# Patient Record
Sex: Female | Born: 1940 | Race: White | Hispanic: No | Marital: Married | Smoking: Never smoker
Health system: Southern US, Community
[De-identification: ages and names within clinical notes are randomized; demographics above are authoritative.]

## PROBLEM LIST (undated history)

## (undated) DIAGNOSIS — R55 Syncope and collapse: Secondary | ICD-10-CM

## (undated) DIAGNOSIS — I10 Essential (primary) hypertension: Secondary | ICD-10-CM

## (undated) DIAGNOSIS — M810 Age-related osteoporosis without current pathological fracture: Secondary | ICD-10-CM

## (undated) DIAGNOSIS — O039 Complete or unspecified spontaneous abortion without complication: Secondary | ICD-10-CM

## (undated) DIAGNOSIS — D649 Anemia, unspecified: Secondary | ICD-10-CM

## (undated) DIAGNOSIS — M5136 Other intervertebral disc degeneration, lumbar region: Secondary | ICD-10-CM

## (undated) DIAGNOSIS — M51369 Other intervertebral disc degeneration, lumbar region without mention of lumbar back pain or lower extremity pain: Secondary | ICD-10-CM

## (undated) DIAGNOSIS — G629 Polyneuropathy, unspecified: Secondary | ICD-10-CM

## (undated) DIAGNOSIS — J189 Pneumonia, unspecified organism: Secondary | ICD-10-CM

## (undated) DIAGNOSIS — G839 Paralytic syndrome, unspecified: Secondary | ICD-10-CM

## (undated) DIAGNOSIS — K635 Polyp of colon: Secondary | ICD-10-CM

## (undated) DIAGNOSIS — Z8719 Personal history of other diseases of the digestive system: Secondary | ICD-10-CM

## (undated) DIAGNOSIS — M1990 Unspecified osteoarthritis, unspecified site: Secondary | ICD-10-CM

## (undated) DIAGNOSIS — Z5189 Encounter for other specified aftercare: Secondary | ICD-10-CM

## (undated) DIAGNOSIS — Z973 Presence of spectacles and contact lenses: Secondary | ICD-10-CM

## (undated) DIAGNOSIS — Z8489 Family history of other specified conditions: Secondary | ICD-10-CM

## (undated) DIAGNOSIS — F419 Anxiety disorder, unspecified: Secondary | ICD-10-CM

## (undated) DIAGNOSIS — G43909 Migraine, unspecified, not intractable, without status migrainosus: Secondary | ICD-10-CM

## (undated) DIAGNOSIS — R011 Cardiac murmur, unspecified: Secondary | ICD-10-CM

## (undated) DIAGNOSIS — K219 Gastro-esophageal reflux disease without esophagitis: Secondary | ICD-10-CM

## (undated) DIAGNOSIS — M5135 Other intervertebral disc degeneration, thoracolumbar region: Secondary | ICD-10-CM

## (undated) DIAGNOSIS — E039 Hypothyroidism, unspecified: Secondary | ICD-10-CM

## (undated) DIAGNOSIS — K259 Gastric ulcer, unspecified as acute or chronic, without hemorrhage or perforation: Secondary | ICD-10-CM

## (undated) HISTORY — PX: OTHER SURGICAL HISTORY: SHX169

## (undated) HISTORY — PX: ANTERIOR CERVICAL DECOMP/DISCECTOMY FUSION: SHX1161

## (undated) HISTORY — DX: Other intervertebral disc degeneration, lumbar region: M51.36

## (undated) HISTORY — PX: DILATION AND CURETTAGE OF UTERUS: SHX78

## (undated) HISTORY — DX: Migraine, unspecified, not intractable, without status migrainosus: G43.909

## (undated) HISTORY — PX: BILATERAL OOPHORECTOMY: SHX1221

## (undated) HISTORY — DX: Other intervertebral disc degeneration, lumbar region without mention of lumbar back pain or lower extremity pain: M51.369

## (undated) HISTORY — DX: Age-related osteoporosis without current pathological fracture: M81.0

## (undated) HISTORY — PX: ABDOMINAL HYSTERECTOMY: SHX81

## (undated) HISTORY — DX: Other intervertebral disc degeneration, thoracolumbar region: M51.35

---

## 2002-07-29 ENCOUNTER — Ambulatory Visit (HOSPITAL_COMMUNITY): Admission: RE | Admit: 2002-07-29 | Discharge: 2002-07-29 | Payer: Self-pay | Admitting: Internal Medicine

## 2006-01-13 ENCOUNTER — Inpatient Hospital Stay (HOSPITAL_COMMUNITY): Admission: RE | Admit: 2006-01-13 | Discharge: 2006-01-15 | Payer: Self-pay | Admitting: Neurosurgery

## 2006-02-01 ENCOUNTER — Inpatient Hospital Stay (HOSPITAL_COMMUNITY): Admission: EM | Admit: 2006-02-01 | Discharge: 2006-02-06 | Payer: Self-pay | Admitting: Emergency Medicine

## 2006-04-11 HISTORY — PX: THORACIC SPINE SURGERY: SHX802

## 2010-04-11 DIAGNOSIS — R55 Syncope and collapse: Secondary | ICD-10-CM

## 2010-04-11 DIAGNOSIS — Z5189 Encounter for other specified aftercare: Secondary | ICD-10-CM

## 2010-04-11 DIAGNOSIS — IMO0001 Reserved for inherently not codable concepts without codable children: Secondary | ICD-10-CM

## 2010-04-11 HISTORY — DX: Reserved for inherently not codable concepts without codable children: IMO0001

## 2010-04-11 HISTORY — DX: Encounter for other specified aftercare: Z51.89

## 2010-04-11 HISTORY — DX: Syncope and collapse: R55

## 2010-10-29 ENCOUNTER — Ambulatory Visit: Payer: PRIVATE HEALTH INSURANCE | Admitting: Cardiology

## 2010-11-03 ENCOUNTER — Telehealth (INDEPENDENT_AMBULATORY_CARE_PROVIDER_SITE_OTHER): Payer: Self-pay | Admitting: *Deleted

## 2010-11-03 ENCOUNTER — Other Ambulatory Visit (INDEPENDENT_AMBULATORY_CARE_PROVIDER_SITE_OTHER): Payer: Self-pay | Admitting: *Deleted

## 2010-11-03 DIAGNOSIS — Z800 Family history of malignant neoplasm of digestive organs: Secondary | ICD-10-CM

## 2010-11-03 DIAGNOSIS — D649 Anemia, unspecified: Secondary | ICD-10-CM

## 2010-11-03 NOTE — Telephone Encounter (Addendum)
TCS/EGD sch'd 11/08/10 @ 9:45 (8:45), ASA 2 days  Pt aware, family member to pick up instruction sheet & prep kit

## 2010-11-04 ENCOUNTER — Encounter (INDEPENDENT_AMBULATORY_CARE_PROVIDER_SITE_OTHER): Payer: Self-pay | Admitting: *Deleted

## 2010-11-04 ENCOUNTER — Other Ambulatory Visit (INDEPENDENT_AMBULATORY_CARE_PROVIDER_SITE_OTHER): Payer: Self-pay | Admitting: *Deleted

## 2010-11-04 DIAGNOSIS — D649 Anemia, unspecified: Secondary | ICD-10-CM

## 2010-11-04 MED ORDER — SODIUM CHLORIDE 0.45 % IV SOLN
Freq: Once | INTRAVENOUS | Status: AC
  Administered 2010-11-05: 07:00:00 via INTRAVENOUS

## 2010-11-05 ENCOUNTER — Ambulatory Visit (HOSPITAL_COMMUNITY)
Admission: RE | Admit: 2010-11-05 | Discharge: 2010-11-05 | Disposition: A | Discharge status: Home / Self Care | Payer: MEDICARE | Source: Ambulatory Visit | Attending: Internal Medicine | Admitting: Internal Medicine

## 2010-11-05 ENCOUNTER — Encounter (HOSPITAL_COMMUNITY): Payer: Self-pay | Admitting: *Deleted

## 2010-11-05 ENCOUNTER — Encounter (HOSPITAL_COMMUNITY)
Admission: RE | Disposition: A | Discharge status: Home / Self Care | Payer: Self-pay | Source: Ambulatory Visit | Attending: Internal Medicine

## 2010-11-05 DIAGNOSIS — K254 Chronic or unspecified gastric ulcer with hemorrhage: Secondary | ICD-10-CM | POA: Insufficient documentation

## 2010-11-05 DIAGNOSIS — K921 Melena: Secondary | ICD-10-CM | POA: Insufficient documentation

## 2010-11-05 DIAGNOSIS — I10 Essential (primary) hypertension: Secondary | ICD-10-CM | POA: Insufficient documentation

## 2010-11-05 DIAGNOSIS — K250 Acute gastric ulcer with hemorrhage: Secondary | ICD-10-CM

## 2010-11-05 DIAGNOSIS — D509 Iron deficiency anemia, unspecified: Secondary | ICD-10-CM | POA: Insufficient documentation

## 2010-11-05 DIAGNOSIS — Z79899 Other long term (current) drug therapy: Secondary | ICD-10-CM | POA: Insufficient documentation

## 2010-11-05 DIAGNOSIS — K449 Diaphragmatic hernia without obstruction or gangrene: Secondary | ICD-10-CM

## 2010-11-05 DIAGNOSIS — Z01812 Encounter for preprocedural laboratory examination: Secondary | ICD-10-CM | POA: Insufficient documentation

## 2010-11-05 HISTORY — DX: Essential (primary) hypertension: I10

## 2010-11-05 HISTORY — PX: ESOPHAGOGASTRODUODENOSCOPY: SHX5428

## 2010-11-05 HISTORY — DX: Anemia, unspecified: D64.9

## 2010-11-05 HISTORY — DX: Encounter for other specified aftercare: Z51.89

## 2010-11-05 HISTORY — DX: Anxiety disorder, unspecified: F41.9

## 2010-11-05 HISTORY — DX: Unspecified osteoarthritis, unspecified site: M19.90

## 2010-11-05 SURGERY — EGD (ESOPHAGOGASTRODUODENOSCOPY)
Anesthesia: Moderate Sedation

## 2010-11-05 MED ORDER — BUTAMBEN-TETRACAINE-BENZOCAINE 2-2-14 % EX AERO
INHALATION_SPRAY | CUTANEOUS | Status: DC | PRN
  Administered 2010-11-05: 2 via TOPICAL

## 2010-11-05 MED ORDER — MEPERIDINE HCL 25 MG/ML IJ SOLN
INTRAMUSCULAR | Status: DC | PRN
  Administered 2010-11-05 (×2): 25 mg via INTRAVENOUS

## 2010-11-05 MED ORDER — PANTOPRAZOLE SODIUM 40 MG PO TBEC: 40.0000 mg | DELAYED_RELEASE_TABLET | Freq: Two times a day (BID) | ORAL | Status: DC

## 2010-11-05 MED ORDER — MEPERIDINE HCL 100 MG/ML IJ SOLN
INTRAMUSCULAR | Status: AC
  Filled 2010-11-05: qty 1

## 2010-11-05 MED ORDER — MIDAZOLAM HCL 5 MG/5ML IJ SOLN
INTRAMUSCULAR | Status: AC
  Filled 2010-11-05: qty 10

## 2010-11-05 MED ORDER — MIDAZOLAM HCL 5 MG/5ML IJ SOLN
INTRAMUSCULAR | Status: DC | PRN
  Administered 2010-11-05: 1 mg via INTRAVENOUS
  Administered 2010-11-05 (×2): 2 mg via INTRAVENOUS

## 2010-11-05 NOTE — H&P (Signed)
Debra Sanchez is an 70 y.o. female.   Chief Complaint: Patient is here for EGD she has history of melena and anemia. HPI: Patient is a 70 year old Caucasian female who's been experiencing intermittent melena over the last 8 weeks. She's had 5 episodes where she would develop postprandial abdominal distention and epigastric pain which resolved after for 45 minutes. She felt like she had food poisoning and will take Prilosec for these episodes are not associated with vomiting she has lost 10 pounds in the last 8 weeks but she's trying to lose weight. Sporadically for musculoskeletal pain or headache she was recently evaluated by Dr. Sherryll Burger in order to have a hemoglobin of less than 7 g. She was admitted to receive total of 4 units. She had a transfusion reaction with the first unit. Post transfusion hemoglobin prior to discharge was 10.8 she was discharged from hospital last evening. She has infrequent heartburn. She denies dysphagia. There is no history of peptic ulcer disease. Patient had a colonic adenoma removed possibly in 2001 her last colonoscopy was at this facility in April 2007 and was normal.  Past Medical History  Diagnosis Date  . Hypertension   . Anemia   . Shortness of breath   . Depression   . Anxiety   . Arthritis   . Blood transfusion     Past Surgical History  Procedure Date  . Abdominal hysterectomy   . Bilateral oophorectomy   . Neck surgery   . 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 Colon cancer Mother    Social History:  reports that she has never smoked. She does not have any smokeless tobacco history on file. She reports that she does not drink alcohol or use illicit drugs.  Allergies:  Allergies  Allergen Reactions  . Codeine Itching  . Morphine And Related Itching  . Sulfa Antibiotics     Medications Prior to Admission  Medication Dose Route Frequency Provider  Last Rate Last Dose  . 0.45 % sodium chloride infusion   Intravenous Once Malissa Hippo, MD 20 mL/hr at 11/05/10 [phone]    . meperidine (DEMEROL) 100 MG/ML injection           . midazolam (VERSED) 5 MG/5ML injection            Medications Prior to Admission  Medication Sig Dispense Refill  . acetaminophen (TYLENOL) 500 MG tablet Take 1,000 mg by mouth every 6 (six) hours as needed.        . hydrochlorothiazide 25 MG tablet Take 25 mg by mouth daily.        . sertraline (ZOLOFT) 100 MG tablet Take 50 mg by mouth daily.        Marland Kitchen zolpidem (AMBIEN CR) 12.5 MG CR tablet Take 5 mg by mouth at bedtime as needed.        Marland Kitchen omeprazole (PRILOSEC) 20 MG capsule Take 20 mg by mouth daily.          No results found for this or any previous visit (from the past 48 hour(s)). No results found.  Review of Systems  Constitutional: Positive for weight loss (10 lb weight loss in 8 weeks.). Negative for fever, chills, malaise/fatigue and diaphoresis.  Respiratory: Positive for shortness of breath.   Gastrointestinal: Positive for heartburn (not very often) and melena (intermittent for the last 8 weeks.). Negative for nausea, vomiting,  diarrhea and constipation. Abdominal pain: upper abdomen with bloating; has had 5 episodes in last 8 weeks.  Neurological: Positive for weakness.    Blood pressure 164/89, pulse 58, temperature 98.2 F (36.8 C), temperature source Oral, resp. rate 17, height 5\' 8"  (1.727 m), weight 200 lb (90.719 kg), SpO2 97.00%. Physical Exam  Constitutional: She appears well-developed and well-nourished.  HENT:  Mouth/Throat: Oropharynx is clear and moist.  Eyes: Conjunctivae are normal. No scleral icterus.  Neck: Neck supple. No JVD present. No thyromegaly present.  Cardiovascular: Normal rate, regular rhythm and normal heart sounds.   No murmur heard. Respiratory: Breath sounds normal.  GI: Soft. She exhibits no distension. There is no tenderness. There is no rebound and no guarding.    Musculoskeletal: She exhibits no edema.  Lymphadenopathy:    She has no cervical adenopathy.  Neurological: She is alert.  Skin: Skin is warm and dry.     Assessment/Plan Melena and anemia. Esophagogastroduodenoscopy. Patient is agreeable.  Manuelita Moxon U 11/05/2010, 7:01 AM

## 2010-11-05 NOTE — Op Note (Signed)
NAME:  Debra Sanchez, Debra Sanchez           ACCOUNT NO.:  [id number]  MEDICAL RECORD NO.:  [id number]  LOCATION:  [facility]                          FACILITY:  [facility]  PHYSICIAN:  Lionel December, M.D.    DATE OF BIRTH:  1940-06-05  DATE OF PROCEDURE:  11/05/2010 DATE OF DISCHARGE:                              OPERATIVE REPORT   PROCEDURE:  Esophagogastroduodenoscopy with therapeutic intervention.  INDICATION:  The patient is a 70 year old Caucasian female, who presents with melena and anemia.  She was just released from [facility] last night where she had 4 units of PRBCs.  Her post transfusion hemoglobin is 10.8. Procedure risks were reviewed with the patient.  Informed consent was obtained.  MEDICATIONS FOR CONSCIOUS SEDATION:  Cetacaine spray for pharyngeal topical anesthesia, Demerol 50 mg IV, Versed 5 mg IV.  FINDINGS:  Procedure performed in endoscopy suite.  The patient's vital signs and O2 sat were monitored during the procedure and remained stable.  The patient was placed in left lateral recumbent position and Pentax videoscope was passed through oropharynx without any difficulty into esophagus.  Esophagus:  Esophageal mucosa was normal.  GE junction was located at 35 cm from the incisors.  Hiatus was at 41 cm from the incisors.  There were long linear erosions at the level of hiatus extending in cephalad direction.  There was about 4 mm x 10 mm ulcer at the hiatus, extending upwards and the herniated part of stomach and proximal margin had a small fresh clot or blood vessel.  This was sealed with Gold probe.  Stomach:  It was empty and distended very well with insufflation.  Folds of proximal stomach are normal.  Examination of mucosa revealed multiple antral erosions and three ulcers, 4-5 mm in diameter.  Two of these had tiny blood vessel, but no active bleeding.  Both of these were treated with Gold probe.  Pyloric channel was patent.  Angularis was unremarkable, fundus and cardia were  also seen on retroflex view.  Duodenum:  Examination of the D1 and D2 was normal.  Endoscope was withdrawn.  The patient tolerated the procedure well.  FINAL DIAGNOSIS:  Moderate size sliding hiatal hernia with erosions and an ulcer at the level of hiatus.  Ulcer with stigmata of bleeding and was sealed with Gold probe.  Multiple antral erosions with three ulcers and two had stigmata of bleed and these were treated also with Gold probe.  RECOMMENDATIONS:  Antireflux measures.  Pantoprazole 40 mg b.i.d. prescription given for 60 with five refills.  The patient advised not to take aspirin or NSAIDs for the time being. We will delay her colonoscopy as the prep may trigger another GI bleed.  She will return for OV in 4 weeks.  At that time, we will determine the timing of her colonoscopy and we will also reexamine her upper GI tract.          ______________________________ Lionel December, M.D.     NR/MEDQ  D:  11/05/2010  T:  11/05/2010  Job:  [id number]  cc:   Dr. Horton Finer. Darovsky, M.D. Fax: [phone]

## 2010-11-05 NOTE — Brief Op Note (Signed)
11/05/2010  7:31 AM  PATIENT:  Debra Sanchez  70 y.o. female  PRE-OPERATIVE DIAGNOSIS:  anemia  POST-OPERATIVE DIAGNOSIS:  Erosions and Ulcerations @ Hiatus @ 40 cm Antral Ulcers Hiatal Hernia  PROCEDURE:  Procedure(s): ESOPHAGOGASTRODUODENOSCOPY (EGD)  SURGEON:  Surgeon(s): Malissa Hippo, MD  EGD. Moderate hiatal hernia  Ulcer with tiny blood vessel  At hiatus extending into hernia; Sealed with gold probe Multiple antral erosions with 3 small ulcers to head or any blood vessels and these were also treated with gold probe. Normal first and second part of duodenum.

## 2010-11-08 ENCOUNTER — Encounter (HOSPITAL_COMMUNITY): Admission: RE | Payer: Self-pay | Source: Ambulatory Visit

## 2010-11-08 ENCOUNTER — Ambulatory Visit (INDEPENDENT_AMBULATORY_CARE_PROVIDER_SITE_OTHER): Payer: PRIVATE HEALTH INSURANCE | Admitting: Internal Medicine

## 2010-11-08 ENCOUNTER — Ambulatory Visit (HOSPITAL_COMMUNITY): Admission: RE | Admit: 2010-11-08 | Payer: MEDICARE | Source: Ambulatory Visit | Admitting: Internal Medicine

## 2010-11-08 SURGERY — COLONOSCOPY
Anesthesia: Moderate Sedation

## 2010-11-08 SURGERY — EGD (ESOPHAGOGASTRODUODENOSCOPY)
Anesthesia: Moderate Sedation

## 2010-11-11 NOTE — Progress Notes (Signed)
H. Pylori serology is negative; Tammy please call patient with results and send copy to PCP. Patient will really need office visit in 8 weeks

## 2010-11-12 ENCOUNTER — Encounter (HOSPITAL_COMMUNITY): Payer: Self-pay | Admitting: Internal Medicine

## 2010-11-13 ENCOUNTER — Telehealth (INDEPENDENT_AMBULATORY_CARE_PROVIDER_SITE_OTHER): Payer: Self-pay | Admitting: Internal Medicine

## 2010-11-16 NOTE — Telephone Encounter (Signed)
Already addressed

## 2010-11-18 ENCOUNTER — Encounter (INDEPENDENT_AMBULATORY_CARE_PROVIDER_SITE_OTHER): Payer: Self-pay

## 2010-11-30 ENCOUNTER — Ambulatory Visit (INDEPENDENT_AMBULATORY_CARE_PROVIDER_SITE_OTHER): Payer: MEDICARE | Admitting: Internal Medicine

## 2010-12-06 ENCOUNTER — Encounter (INDEPENDENT_AMBULATORY_CARE_PROVIDER_SITE_OTHER): Payer: Self-pay | Admitting: Internal Medicine

## 2010-12-06 ENCOUNTER — Telehealth (INDEPENDENT_AMBULATORY_CARE_PROVIDER_SITE_OTHER): Payer: Self-pay | Admitting: *Deleted

## 2010-12-06 ENCOUNTER — Ambulatory Visit (INDEPENDENT_AMBULATORY_CARE_PROVIDER_SITE_OTHER): Payer: MEDICARE | Admitting: Internal Medicine

## 2010-12-06 DIAGNOSIS — D649 Anemia, unspecified: Secondary | ICD-10-CM

## 2010-12-06 DIAGNOSIS — Z8719 Personal history of other diseases of the digestive system: Secondary | ICD-10-CM

## 2010-12-06 DIAGNOSIS — K922 Gastrointestinal hemorrhage, unspecified: Secondary | ICD-10-CM

## 2010-12-06 NOTE — Telephone Encounter (Signed)
TCS/EGD sch'd 01/19/11 at 2:00 (1:00), split movi prep instructions given, sample given

## 2010-12-06 NOTE — Patient Instructions (Addendum)
Physician will call you with results of blood work. EGD and Colonoscopy in 6 weeks.

## 2010-12-06 NOTE — Progress Notes (Signed)
Presenting complaint;  followup for complicated GERD and peptic ulcer disease.  subjective; patient is 70 year old Caucasian female was admitted to [facility] last month  for 4 unit bleed. she was evaluated by me as an outpatient.  She had EGD on 11/05/2010 and multiple findings. She had moderate size sliding-type hernia with ulcer at the level of hiatus for stigmata of bleeding. She had 3 gastric ulcers and to add tiny clots over the and. All 3 of these were meals with gold probe.  Her H. Pylori serology came back negative. She   has been maintained on double dose PPI.  She states she felt bad for one week she believes she had a reaction to blood transfusion although she did not develop jaundice skin rash.  Her weakness is resolved. She has a good appetite. She denies abdominal pain melena or rectal bleeding.  She has experienced some diarrhea with to 3 stools per day she believes this is secondary to EPI therapy and she is glad because she has tendency to constipation.  current medications; Current Outpatient Prescriptions on File Prior to Visit  Medication Sig Dispense Refill  . hydrochlorothiazide 25 MG tablet Take 25 mg by mouth daily.        . pantoprazole (PROTONIX) 40 MG tablet Take 1 tablet (40 mg total) by mouth 2 (two) times daily. Taper tonics 30 minutes before breakfast and 30 units before evening meal.  60 tablet  5  . sertraline (ZOLOFT) 100 MG tablet Take 50 mg by mouth daily.        Marland Kitchen zolpidem (AMBIEN CR) 12.5 MG CR tablet Take 5 mg by mouth at bedtime as needed.        Marland Kitchen acetaminophen (TYLENOL) 500 MG tablet Take 1,000 mg by mouth every 6 (six) hours as needed.         Objective; BP 120/70  Pulse 74  Temp(Src) 98.8 F (37.1 C) (Oral)  Ht 5\' 8"  (1.727 m)  Wt 201 lb (91.173 kg)  BMI 30.56 kg/m2  Conjunctiva is pink. Sclera is nonicteric. Oropharyngeal mucosa is normal. No neck masses are noted. Abdomen is metrical soft and nontender. No organomegaly or masses noted.  No peripheral  edema or clubbing noted  Assessment;  #1.  Upper GI bleed secondary to 2 antral ulcers another ulcer at the level of hiatus.  Her H. Pylori serology is negative.  She is doing well with therapy with minimal side effects in the form of diarrhea.  She when she would be a single dose PPI and I believe this would not be an issue.  #2.   Anemia;  Secondary to above check a CBC today.  #3.  History of colonic polyps and family history of colon carcinoma and her mother.  Recommendations;   continue pantoprazole at twice a day schedule for now.   She will go to the lab for CBC.   We'll schedule her for EGD and a colonoscopy in 6-7   weeks.

## 2010-12-07 LAB — CBC
HCT: 36.8 % (ref 36.0–46.0)
Hemoglobin: 11 g/dL — ABNORMAL LOW (ref 12.0–15.0)
MCH: 24.4 pg — ABNORMAL LOW (ref 26.0–34.0)
MCHC: 29.9 g/dL — ABNORMAL LOW (ref 30.0–36.0)
MCV: 81.8 fL (ref 78.0–100.0)
Platelets: 255 10*3/uL (ref 150–400)
RBC: 4.5 MIL/uL (ref 3.87–5.11)
RDW: 20.4 % — ABNORMAL HIGH (ref 11.5–15.5)
WBC: 6.7 10*3/uL (ref 4.0–10.5)

## 2010-12-08 NOTE — Progress Notes (Signed)
faxd

## 2010-12-29 ENCOUNTER — Encounter (INDEPENDENT_AMBULATORY_CARE_PROVIDER_SITE_OTHER): Payer: Self-pay | Admitting: Internal Medicine

## 2011-01-06 LAB — HELICOBACTER PYLORI ABS-IGG+IGA, BLD
H Pylori IgA: 1.9 U/mL (ref <9.0)
H Pylori IgG: 0.7 {ISR}

## 2011-01-19 ENCOUNTER — Encounter (HOSPITAL_COMMUNITY)
Admission: RE | Disposition: A | Discharge status: Home / Self Care | Payer: Self-pay | Source: Ambulatory Visit | Attending: Internal Medicine

## 2011-01-19 ENCOUNTER — Other Ambulatory Visit (INDEPENDENT_AMBULATORY_CARE_PROVIDER_SITE_OTHER): Payer: Self-pay | Admitting: Internal Medicine

## 2011-01-19 ENCOUNTER — Encounter (HOSPITAL_COMMUNITY): Payer: Self-pay | Admitting: *Deleted

## 2011-01-19 ENCOUNTER — Ambulatory Visit (HOSPITAL_COMMUNITY)
Admission: RE | Admit: 2011-01-19 | Discharge: 2011-01-19 | Disposition: A | Discharge status: Home / Self Care | Payer: MEDICARE | Source: Ambulatory Visit | Attending: Internal Medicine | Admitting: Internal Medicine

## 2011-01-19 DIAGNOSIS — Z800 Family history of malignant neoplasm of digestive organs: Secondary | ICD-10-CM | POA: Insufficient documentation

## 2011-01-19 DIAGNOSIS — Z860100 Personal history of colon polyps, unspecified: Secondary | ICD-10-CM | POA: Insufficient documentation

## 2011-01-19 DIAGNOSIS — D126 Benign neoplasm of colon, unspecified: Secondary | ICD-10-CM | POA: Insufficient documentation

## 2011-01-19 DIAGNOSIS — Z79899 Other long term (current) drug therapy: Secondary | ICD-10-CM | POA: Insufficient documentation

## 2011-01-19 DIAGNOSIS — K449 Diaphragmatic hernia without obstruction or gangrene: Secondary | ICD-10-CM | POA: Insufficient documentation

## 2011-01-19 DIAGNOSIS — R1013 Epigastric pain: Secondary | ICD-10-CM | POA: Insufficient documentation

## 2011-01-19 DIAGNOSIS — K259 Gastric ulcer, unspecified as acute or chronic, without hemorrhage or perforation: Secondary | ICD-10-CM

## 2011-01-19 DIAGNOSIS — Z09 Encounter for follow-up examination after completed treatment for conditions other than malignant neoplasm: Secondary | ICD-10-CM | POA: Insufficient documentation

## 2011-01-19 DIAGNOSIS — I10 Essential (primary) hypertension: Secondary | ICD-10-CM | POA: Insufficient documentation

## 2011-01-19 DIAGNOSIS — Z8711 Personal history of peptic ulcer disease: Secondary | ICD-10-CM | POA: Insufficient documentation

## 2011-01-19 HISTORY — PX: COLONOSCOPY: SHX5424

## 2011-01-19 LAB — HEMOGLOBIN AND HEMATOCRIT, BLOOD
HCT: 33.1 % — ABNORMAL LOW (ref 36.0–46.0)
Hemoglobin: 10.8 g/dL — ABNORMAL LOW (ref 12.0–15.0)

## 2011-01-19 SURGERY — COLONOSCOPY
Anesthesia: Moderate Sedation

## 2011-01-19 MED ORDER — MIDAZOLAM HCL 5 MG/5ML IJ SOLN
INTRAMUSCULAR | Status: AC
  Filled 2011-01-19: qty 10

## 2011-01-19 MED ORDER — MIDAZOLAM HCL 5 MG/5ML IJ SOLN
INTRAMUSCULAR | Status: DC | PRN
  Administered 2011-01-19 (×3): 2 mg via INTRAVENOUS

## 2011-01-19 MED ORDER — MEPERIDINE HCL 50 MG/ML IJ SOLN
INTRAMUSCULAR | Status: DC | PRN
  Administered 2011-01-19 (×2): 25 mg via INTRAVENOUS

## 2011-01-19 MED ORDER — BUTAMBEN-TETRACAINE-BENZOCAINE 2-2-14 % EX AERO
INHALATION_SPRAY | CUTANEOUS | Status: DC | PRN
  Administered 2011-01-19: 2 via TOPICAL

## 2011-01-19 MED ORDER — MEPERIDINE HCL 50 MG/ML IJ SOLN
INTRAMUSCULAR | Status: AC
  Filled 2011-01-19: qty 1

## 2011-01-19 MED ORDER — STERILE WATER FOR IRRIGATION IR SOLN
Status: DC | PRN
  Administered 2011-01-19: 15:00:00

## 2011-01-19 NOTE — Op Note (Addendum)
EGD PROCEDURE REPORT  PATIENT:  Debra Sanchez  MR#:  [id number] Birthdate:  [birth year], 70 y.o., female Endoscopist:  Dr. Malissa Hippo, MD Referred By:  Dr. Kirstie Peri, MD Procedure Date: 01/19/2011  Procedure:   EGD & Colonoscopy with snare polypectomy.  Indications:  Patient is 70 year old Caucasian female was evaluated in July this year for 4 units of GI bleed. She had 3 gastric ulcers to with stigmata of bleed. These were treated. She is undergoing EGD to document healing of these ulcers. She is also undergoing surveillance colonoscopy. She has history of colonic polyps and family history of colon carcinoma in her mother.            Informed Consent: Both the procedures and risks were reviewed with the patient and informed consent was obtained. Medications:  Demerol 50 mg IV Versed 6 mg IV Cetacaine spray topically for oropharyngeal anesthesia  EGD  Description of procedure:  The endoscope was introduced through the mouth and advanced to the second portion of the duodenum without difficulty or limitations. The mucosal surfaces were surveyed very carefully during advancement of the scope and upon withdrawal.  Findings:  Esophagus:  Normal mucosa of the esophagus. GEJ:  34 cm Hiatus:  40 cm. 2 linear erosions noted at the level of hiatus but ulcer had completely healed. Stomach:  Was empty and distended very well with insufflation. Folds in the proximal stomach was normal. Examination of mucosa at body antrum pyloric channel as well as angularis was normal. Antral ulcers had completely healed. Ileum was easily seen on retroflexed view. Duodenum:  Normal bulbar and post bulbar mucosa.  Therapeutic/Diagnostic Maneuvers Performed:  None  COLONOSCOPY Description of procedure:  After a digital rectal exam was performed, that colonoscope was advanced from the anus through the rectum and colon to the area of the cecum, ileocecal valve and appendiceal orifice. The cecum was deeply  intubated. These structures were well-seen and photographed for the record. From the level of the cecum and ileocecal valve, the scope was slowly and cautiously withdrawn. The mucosal surfaces were carefully surveyed utilizing scope tip to flexion to facilitate fold flattening as needed. The scope was pulled down into the rectum where a thorough exam including retroflexion was performed. Short segment of TI was also examined.  Findings:   Preparation excellent. Normal terminal ileum. 6 mm  polyp snared from proximal sigmoid colon. Rest of the mucosa was normal. No abnormality noted to rectal mucosa as well as anorectal junction.  Therapeutic/Diagnostic Maneuvers Performed:  See above  Complications:  None  Cecal Withdrawal Time:  12 minutes  Impression:  Moderate size sliding hiatal hernia with linear erosions at the level of hiatus. All 3 ulcers have healed; 2 of which were at antrum and one was at the level of hiatus. Normal terminal ileum. 6 mm  polyp snared from proximal sigmoid colon  Recommendations:  Check her H&H today. Continue anti-reflux measures and pantoprazole at 40 mg twice daily. I will be contacting patient with results of biopsy. Office visit in 4 months.  Addasyn Mcbreen U  01/19/2011 3:09 PM  CC: Dr. Kirstie Peri, MD & Dr. Bonnetta Barry ref. 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

## 2011-01-19 NOTE — Progress Notes (Signed)
Hgb, hct drawn and sent to lab.

## 2011-01-19 NOTE — H&P (Signed)
Debra Sanchez is an 70 y.o. female.   Chief Complaint: Patient is here for EGD and a colonoscopy. HPI: Patient is 70 year old Caucasian female who was evaluated in July for 4 unit bleed. She had EGD revealing 3 gastric ulcers to with stigmata of bleeding and there were coagulated. He was seen in the office about 6 weeks ago her hemoglobin was up to 11 g. She is returning for followup EGD to document healing of these ulcers. She does not take any NSAIDs. Patient's H. pylori serology was negative. She does complain of intermittent epigastric pain which goes away spontaneously and she also complains of right lower quadrant pain which does not occur very often. She denies melena or rectal bleeding. She states her heartburn is well controlled with therapy. She has history of colonic polyps and family history of colon carcinoma. Her mother was diagnosed with rectal carcinoma at age 24 and lived to be 62. Patient's last colonoscopy was about 5 years  Past Medical History  Diagnosis Date  . Hypertension   . Anemia   . Anxiety   . Arthritis   . Blood transfusion     Past Surgical History  Procedure Date  . Abdominal hysterectomy   . Bilateral oophorectomy   . Neck surgery   . 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 Esophagogastroduodenoscopy 11/05/2010    Procedure: ESOPHAGOGASTRODUODENOSCOPY (EGD);  Surgeon: Malissa Hippo, MD;  Location: [facility];  Service: Endoscopy;  Laterality: N/A;  7:00 am per Shawna Orleans    Family History  Problem Relation Age of Onset  . Colon cancer Mother    Social History:  reports that she has never smoked. She has never used smokeless tobacco. She reports that she does not drink alcohol or use illicit drugs.  Allergies:  Allergies  Allergen Reactions  . Codeine Itching  . Morphine And Related Itching  . Sulfa Antibiotics Nausea And Vomiting    Medications Prior to Admission  Medication Dose Route Frequency  Provider Last Rate Last Dose  . meperidine (DEMEROL) 50 MG/ML injection           . midazolam (VERSED) 5 MG/5ML injection            Medications Prior to Admission  Medication Sig Dispense Refill  . acetaminophen (TYLENOL) 500 MG tablet Take 1,000 mg by mouth every 6 (six) hours as needed. For pain      . ALPRAZolam (XANAX) 0.5 MG tablet Take 0.25 mg by mouth daily as needed. For anxiety       . hydrochlorothiazide 25 MG tablet Take 25 mg by mouth daily.        . Multiple Vitamins-Minerals (MULTIVITAMINS THER. W/MINERALS) TABS Take 1 tablet by mouth daily.        . pantoprazole (PROTONIX) 40 MG tablet Take 40 mg by mouth 2 (two) times daily. Taper tonics 30 minutes before breakfast and 30 units before evening meal.       . zolpidem (AMBIEN) 10 MG tablet Take 5 mg by mouth at bedtime as needed. For sleep       . sertraline (ZOLOFT) 100 MG tablet Take 50 mg by mouth daily.        Marland Kitchen zolpidem (AMBIEN CR) 12.5 MG CR tablet Take 5 mg by mouth at bedtime as needed.          No results found for this or any previous visit (from the past 48 hour(s)). No  results found.  Review of Systems  Constitutional: Negative for weight loss.  Gastrointestinal: Negative for heartburn, nausea, vomiting, diarrhea, constipation, blood in stool and melena. Abdominal pain: intermittent epigastricand occasional right lower quadrant abdominal pain.    Blood pressure 159/81, temperature 97.9 F (36.6 C), resp. rate 15, height 5\' 8"  (1.727 m), weight 195 lb (88.451 kg), SpO2 99.00%. Physical Exam  Constitutional: She appears well-developed and well-nourished.  HENT:  Mouth/Throat: Oropharynx is clear and moist.  Eyes: Conjunctivae are normal. No scleral icterus.  Neck: No thyromegaly present.  Cardiovascular: Normal rate, regular rhythm and normal heart sounds.   Respiratory: Effort normal and breath sounds normal.  GI: Soft. Bowel sounds are normal. She exhibits no distension and no mass. There is no tenderness.    Musculoskeletal: She exhibits no edema.  Lymphadenopathy:    She has no cervical adenopathy.  Neurological: She is alert.  Skin: Skin is warm and dry.     Assessment/Plan Screening of gastric ulcers. Stricture of colonic polyps and family history of colon carcinoma in  Mother. EGD and colonoscopy.  Joson Sapp U 01/19/2011, 2:26 PM

## 2011-01-26 ENCOUNTER — Encounter (INDEPENDENT_AMBULATORY_CARE_PROVIDER_SITE_OTHER): Payer: Self-pay | Admitting: *Deleted

## 2011-01-27 ENCOUNTER — Encounter (HOSPITAL_COMMUNITY): Payer: Self-pay | Admitting: Internal Medicine

## 2011-05-06 ENCOUNTER — Encounter (INDEPENDENT_AMBULATORY_CARE_PROVIDER_SITE_OTHER): Payer: Self-pay | Admitting: *Deleted

## 2011-06-07 ENCOUNTER — Ambulatory Visit (INDEPENDENT_AMBULATORY_CARE_PROVIDER_SITE_OTHER): Payer: MEDICARE | Admitting: Internal Medicine

## 2011-07-27 ENCOUNTER — Telehealth (INDEPENDENT_AMBULATORY_CARE_PROVIDER_SITE_OTHER): Payer: Self-pay | Admitting: *Deleted

## 2011-07-27 DIAGNOSIS — D649 Anemia, unspecified: Secondary | ICD-10-CM

## 2011-07-27 NOTE — Telephone Encounter (Signed)
Patient called and made aware. Pantoprazole called to [facility] - Take 1 by mouth twice a day #60 11 refills Hemocult card mailed to the patient CBC noted for 08-09-11 , patient states that she will go to [facility] to have drawn.

## 2011-07-27 NOTE — Telephone Encounter (Signed)
We can call in  refill for pantoprazole. She needs to take ferrous sulfate OTC twice daily. Hemoccult x1. CBC in 10 days.

## 2011-07-27 NOTE — Telephone Encounter (Signed)
Mrs. Thew has an office appointment with Terri on Monday. She saw Dr. Clelia Croft on yesterday ,CBC drawn and her Hgb was 9.1. No rectal exam done and patient has not seen any blood. She is complaining of weakness, and ask if we would call her in some Iron to take and a refill on her Pantoprazole. She just started back taking 2 a day. [facility] is her Pharmacy [phone] Per patient she had a EGD in June , then another one a little later and it showed that her ulcers had healed.She may be reached at work [phone] or her cell phone [phone].

## 2011-07-27 NOTE — Telephone Encounter (Signed)
Patient called and left a message to call me back.

## 2011-08-01 ENCOUNTER — Ambulatory Visit (INDEPENDENT_AMBULATORY_CARE_PROVIDER_SITE_OTHER): Payer: MEDICARE | Admitting: Internal Medicine

## 2011-08-25 ENCOUNTER — Telehealth (INDEPENDENT_AMBULATORY_CARE_PROVIDER_SITE_OTHER): Payer: Self-pay | Admitting: *Deleted

## 2011-08-25 DIAGNOSIS — D649 Anemia, unspecified: Secondary | ICD-10-CM

## 2011-08-25 NOTE — Telephone Encounter (Signed)
Per Dr.Rehman on 08/24/11 the patient will need to have H/H in 4 weeks. Recent lab H/H was coming up. Patient will be going to [facility] lab.

## 2011-09-09 ENCOUNTER — Other Ambulatory Visit (INDEPENDENT_AMBULATORY_CARE_PROVIDER_SITE_OTHER): Payer: Self-pay | Admitting: *Deleted

## 2011-09-09 ENCOUNTER — Encounter (INDEPENDENT_AMBULATORY_CARE_PROVIDER_SITE_OTHER): Payer: Self-pay | Admitting: *Deleted

## 2011-09-09 DIAGNOSIS — D649 Anemia, unspecified: Secondary | ICD-10-CM

## 2011-09-20 ENCOUNTER — Encounter (INDEPENDENT_AMBULATORY_CARE_PROVIDER_SITE_OTHER): Payer: Self-pay

## 2011-09-22 LAB — HEMOGLOBIN AND HEMATOCRIT, BLOOD
HCT: 39.8 % (ref 36.0–46.0)
Hemoglobin: 13.1 g/dL (ref 12.0–15.0)

## 2011-09-26 ENCOUNTER — Telehealth (INDEPENDENT_AMBULATORY_CARE_PROVIDER_SITE_OTHER): Payer: Self-pay | Admitting: *Deleted

## 2011-09-26 DIAGNOSIS — D649 Anemia, unspecified: Secondary | ICD-10-CM

## 2011-09-26 NOTE — Telephone Encounter (Signed)
The patient will need a CBC/D in 2 months, Lab noted for August  17,2013.

## 2011-11-09 ENCOUNTER — Encounter (INDEPENDENT_AMBULATORY_CARE_PROVIDER_SITE_OTHER): Payer: Self-pay | Admitting: *Deleted

## 2011-11-09 ENCOUNTER — Other Ambulatory Visit (INDEPENDENT_AMBULATORY_CARE_PROVIDER_SITE_OTHER): Payer: Self-pay | Admitting: *Deleted

## 2011-11-09 DIAGNOSIS — D649 Anemia, unspecified: Secondary | ICD-10-CM

## 2011-11-29 LAB — CBC WITH DIFFERENTIAL/PLATELET
Basophils Absolute: 0 10*3/uL (ref 0.0–0.1)
Basophils Relative: 0 % (ref 0–1)
Eosinophils Absolute: 0.1 10*3/uL (ref 0.0–0.7)
Eosinophils Relative: 2 % (ref 0–5)
HCT: 38.2 % (ref 36.0–46.0)
Hemoglobin: 12.7 g/dL (ref 12.0–15.0)
Lymphocytes Relative: 39 % (ref 12–46)
Lymphs Abs: 2.1 10*3/uL (ref 0.7–4.0)
MCH: 29.3 pg (ref 26.0–34.0)
MCHC: 33.2 g/dL (ref 30.0–36.0)
MCV: 88.2 fL (ref 78.0–100.0)
Monocytes Absolute: 0.3 10*3/uL (ref 0.1–1.0)
Monocytes Relative: 6 % (ref 3–12)
Neutro Abs: 2.8 10*3/uL (ref 1.7–7.7)
Neutrophils Relative %: 53 % (ref 43–77)
Platelets: 223 10*3/uL (ref 150–400)
RBC: 4.33 MIL/uL (ref 3.87–5.11)
RDW: 13.8 % (ref 11.5–15.5)
WBC: 5.4 10*3/uL (ref 4.0–10.5)

## 2012-03-22 ENCOUNTER — Other Ambulatory Visit (HOSPITAL_COMMUNITY): Payer: Self-pay | Admitting: Pulmonary Disease

## 2012-03-22 DIAGNOSIS — Z780 Asymptomatic menopausal state: Secondary | ICD-10-CM

## 2012-03-27 ENCOUNTER — Ambulatory Visit (HOSPITAL_COMMUNITY)
Admission: RE | Admit: 2012-03-27 | Discharge: 2012-03-27 | Disposition: A | Discharge status: Home / Self Care | Payer: Medicare Other | Source: Ambulatory Visit | Attending: Pulmonary Disease | Admitting: Pulmonary Disease

## 2012-03-27 DIAGNOSIS — Z13820 Encounter for screening for osteoporosis: Secondary | ICD-10-CM | POA: Insufficient documentation

## 2012-03-27 DIAGNOSIS — Z780 Asymptomatic menopausal state: Secondary | ICD-10-CM

## 2012-05-09 ENCOUNTER — Encounter (INDEPENDENT_AMBULATORY_CARE_PROVIDER_SITE_OTHER): Payer: Self-pay | Admitting: *Deleted

## 2012-05-15 ENCOUNTER — Encounter (INDEPENDENT_AMBULATORY_CARE_PROVIDER_SITE_OTHER): Payer: Self-pay | Admitting: Internal Medicine

## 2012-05-15 ENCOUNTER — Ambulatory Visit (INDEPENDENT_AMBULATORY_CARE_PROVIDER_SITE_OTHER): Payer: Medicare Other | Admitting: Internal Medicine

## 2012-05-15 VITALS — BP 92/50 | HR 64 | Temp 97.9°F | Ht 67.0 in | Wt 207.7 lb

## 2012-05-15 DIAGNOSIS — Z86010 Personal history of colonic polyps: Secondary | ICD-10-CM | POA: Insufficient documentation

## 2012-05-15 DIAGNOSIS — K922 Gastrointestinal hemorrhage, unspecified: Secondary | ICD-10-CM | POA: Insufficient documentation

## 2012-05-15 DIAGNOSIS — R195 Other fecal abnormalities: Secondary | ICD-10-CM

## 2012-05-15 DIAGNOSIS — I10 Essential (primary) hypertension: Secondary | ICD-10-CM | POA: Insufficient documentation

## 2012-05-15 DIAGNOSIS — E039 Hypothyroidism, unspecified: Secondary | ICD-10-CM | POA: Insufficient documentation

## 2012-05-15 DIAGNOSIS — D500 Iron deficiency anemia secondary to blood loss (chronic): Secondary | ICD-10-CM | POA: Insufficient documentation

## 2012-05-15 DIAGNOSIS — D649 Anemia, unspecified: Secondary | ICD-10-CM

## 2012-05-15 HISTORY — DX: Other fecal abnormalities: R19.5

## 2012-05-15 LAB — CBC WITH DIFFERENTIAL/PLATELET
Basophils Absolute: 0 10*3/uL (ref 0.0–0.1)
Basophils Relative: 1 % (ref 0–1)
Eosinophils Absolute: 0.1 10*3/uL (ref 0.0–0.7)
Eosinophils Relative: 2 % (ref 0–5)
HCT: 32.8 % — ABNORMAL LOW (ref 36.0–46.0)
Hemoglobin: 10.7 g/dL — ABNORMAL LOW (ref 12.0–15.0)
Lymphocytes Relative: 42 % (ref 12–46)
Lymphs Abs: 2.1 10*3/uL (ref 0.7–4.0)
MCH: 27.5 pg (ref 26.0–34.0)
MCHC: 32.6 g/dL (ref 30.0–36.0)
MCV: 84.3 fL (ref 78.0–100.0)
Monocytes Absolute: 0.3 10*3/uL (ref 0.1–1.0)
Monocytes Relative: 6 % (ref 3–12)
Neutro Abs: 2.6 10*3/uL (ref 1.7–7.7)
Neutrophils Relative %: 49 % (ref 43–77)
Platelets: 272 10*3/uL (ref 150–400)
RBC: 3.89 MIL/uL (ref 3.87–5.11)
RDW: 17 % — ABNORMAL HIGH (ref 11.5–15.5)
WBC: 5.1 10*3/uL (ref 4.0–10.5)

## 2012-05-15 LAB — IRON AND TIBC
%SAT: 66 % — ABNORMAL HIGH (ref 20–55)
Iron: 197 ug/dL — ABNORMAL HIGH (ref 42–145)
TIBC: 297 ug/dL (ref 250–470)
UIBC: 100 ug/dL — ABNORMAL LOW (ref 125–400)

## 2012-05-15 LAB — FERRITIN: Ferritin: 6 ng/mL — ABNORMAL LOW (ref 10–291)

## 2012-05-15 NOTE — Patient Instructions (Addendum)
CBC, Iron studies. Ov in one minute

## 2012-05-15 NOTE — Progress Notes (Signed)
Subjective:     Patient ID: Debra Sanchez, female   DOB: Feb 29, 1941, 72 y.o.   MRN: [id number]  HPI Referred to our office by Dr. Juanetta Gosling for blood in her stool. One out of 3 Hemocult cards were positive. She has a hx of anemia. She tells me she has a hx of ulcer. In July 2012, she was [facility]  She received 4 unit of PRBCs. She refused EGD at [facility] and went to [facility].  10/29/10 EGD Dr. Karilyn Cota for upper GI bleed:FINAL DIAGNOSIS: Moderate size sliding hiatal hernia with erosions and  an ulcer at the level of hiatus. Ulcer with stigmata of bleeding and  was sealed with Gold probe.  Multiple antral erosions with three ulcers and two had stigmata of bleed and these were treated also with Gold probe.  01/2011 EGD and Colonoscopy: Impression:  Moderate size sliding hiatal hernia with linear erosions at the level of hiatus.  All 3 ulcers have healed; 2 of which were at antrum and one was at the level of hiatus.  Normal terminal ileum.  6 mm polyp snared from proximal sigmoid colon Biopsy: Tubular adenoma. No high grade dysplasia.   Noted H and H 9.4 and 29.8, MCV 8 03/22/2012  Appetite is good. No weight loss. No abdominal pain. No acid reflux. No dysphagia.  BMs x 1 a day. Brown in color. No melena or bright red rectal bleeding.  CBC    Component Value Date/Time   WBC 5.4 11/29/2011 1000   RBC 4.33 11/29/2011 1000   HGB 12.7 11/29/2011 1000   HCT 38.2 11/29/2011 1000   PLT 223 11/29/2011 1000   MCV 88.2 11/29/2011 1000   MCH 29.3 11/29/2011 1000   MCHC 33.2 11/29/2011 1000   RDW 13.8 11/29/2011 1000   LYMPHSABS 2.1 11/29/2011 1000   MONOABS 0.3 11/29/2011 1000   EOSABS 0.1 11/29/2011 1000   BASOSABS 0.0 11/29/2011 1000    Review of Systems Current Outpatient Prescriptions  Medication Sig Dispense Refill  . aspirin 81 MG tablet Take 81 mg by mouth as needed.      . ferrous gluconate (FERGON) 325 MG tablet Take 325 mg by mouth daily with breakfast.      .  glucosamine-chondroitin 500-400 MG tablet Take 1 tablet by mouth daily.        Marland Kitchen levothyroxine (SYNTHROID, LEVOTHROID) 50 MCG tablet Take 50 mcg by mouth daily.      . MULTIPLE VITAMINS PO Take by mouth daily.        Marland Kitchen olmesartan-hydrochlorothiazide (BENICAR HCT) 40-12.5 MG per tablet Take 1 tablet by mouth daily.      . Omega-3-6-9 CAPS Take 1 capsule by mouth daily.       . pantoprazole (PROTONIX) 40 MG tablet Take 40 mg by mouth daily.      . sertraline (ZOLOFT) 100 MG tablet Take 50 mg by mouth daily.        . pantoprazole (PROTONIX) 40 MG tablet Take 40 mg by mouth 2 (two) times daily. Taper tonics 30 minutes before breakfast and 30 units before evening meal.       Past Medical History  Diagnosis Date  . Hypertension   . Anemia   . Anxiety   . Arthritis   . Blood transfusion    Past Surgical History  Procedure Date  . Abdominal hysterectomy   . Bilateral oophorectomy   . Neck surgery   . 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 Esophagogastroduodenoscopy 11/05/2010    Procedure: ESOPHAGOGASTRODUODENOSCOPY (EGD);  Surgeon: Malissa Hippo, MD;  [facility];  Service: Endoscopy;  Laterality: N/A;  7:00 am per [facility]  . Colonoscopy 01/19/2011    Procedure: COLONOSCOPY;  Surgeon: Malissa Hippo, MD;  [facility];  Service: Endoscopy;  Laterality: N/A;  2:00   Allergies  Allergen Reactions  . Codeine Itching  . Morphine And Related Itching  . Sulfa Antibiotics Nausea And Vomiting       Objective:   Physical Exam Filed Vitals:   05/15/12 0950  BP: 92/50  Pulse: 64  Temp: 97.9 F (36.6 C)  Height: 5\' 7"  (1.702 m)  Weight: 207 lb 11.2 oz (94.212 kg)   Alert and oriented. Skin warm and dry. Oral mucosa is moist.   . Sclera anicteric, conjunctivae is pink. Thyroid not enlarged. No cervical lymphadenopathy. Lungs clear. Heart regular rate and rhythm.  Abdomen is soft. Bowel sounds are positive. No hepatomegaly. No  abdominal masses felt. No tenderness.  No edema to lower extremities. Stool brown and guaiac negative today.     Assessment:    Anemia. Heme positive stool per Dr. Juanetta Gosling. Negative today in office. CBC in August of this year was normal. She has had a drop in her hemoglobin. Recent colonoscopy was basically normal. No GI problems at this time. I discussed this case with Dr. Karilyn Cota.      Plan:    CBC, ferritin, iron, TIBC. OV in 1 month.

## 2012-05-23 ENCOUNTER — Telehealth (INDEPENDENT_AMBULATORY_CARE_PROVIDER_SITE_OTHER): Payer: Self-pay | Admitting: *Deleted

## 2012-05-23 DIAGNOSIS — D649 Anemia, unspecified: Secondary | ICD-10-CM

## 2012-05-23 NOTE — Telephone Encounter (Signed)
Per Delrae Rend the patient will need to have lab in March 2014

## 2012-06-07 ENCOUNTER — Other Ambulatory Visit (INDEPENDENT_AMBULATORY_CARE_PROVIDER_SITE_OTHER): Payer: Self-pay | Admitting: *Deleted

## 2012-06-07 ENCOUNTER — Encounter (INDEPENDENT_AMBULATORY_CARE_PROVIDER_SITE_OTHER): Payer: Self-pay | Admitting: *Deleted

## 2012-06-07 DIAGNOSIS — D649 Anemia, unspecified: Secondary | ICD-10-CM

## 2012-06-11 ENCOUNTER — Ambulatory Visit (INDEPENDENT_AMBULATORY_CARE_PROVIDER_SITE_OTHER): Payer: Medicare Other | Admitting: Internal Medicine

## 2012-06-13 LAB — CBC
HCT: 35.9 % — ABNORMAL LOW (ref 36.0–46.0)
Hemoglobin: 11.6 g/dL — ABNORMAL LOW (ref 12.0–15.0)
MCH: 27.5 pg (ref 26.0–34.0)
MCHC: 32.3 g/dL (ref 30.0–36.0)
MCV: 85.1 fL (ref 78.0–100.0)
Platelets: 258 10*3/uL (ref 150–400)
RBC: 4.22 MIL/uL (ref 3.87–5.11)
RDW: 17 % — ABNORMAL HIGH (ref 11.5–15.5)
WBC: 5.4 10*3/uL (ref 4.0–10.5)

## 2012-06-18 ENCOUNTER — Ambulatory Visit (INDEPENDENT_AMBULATORY_CARE_PROVIDER_SITE_OTHER): Payer: Medicare Other | Admitting: Internal Medicine

## 2012-07-04 ENCOUNTER — Encounter (INDEPENDENT_AMBULATORY_CARE_PROVIDER_SITE_OTHER): Payer: Self-pay

## 2012-07-19 ENCOUNTER — Encounter (INDEPENDENT_AMBULATORY_CARE_PROVIDER_SITE_OTHER): Payer: Self-pay | Admitting: Internal Medicine

## 2012-07-19 ENCOUNTER — Other Ambulatory Visit (INDEPENDENT_AMBULATORY_CARE_PROVIDER_SITE_OTHER): Payer: Self-pay | Admitting: *Deleted

## 2012-07-19 ENCOUNTER — Encounter (INDEPENDENT_AMBULATORY_CARE_PROVIDER_SITE_OTHER): Payer: Self-pay | Admitting: *Deleted

## 2012-07-19 ENCOUNTER — Ambulatory Visit (INDEPENDENT_AMBULATORY_CARE_PROVIDER_SITE_OTHER): Payer: Medicare Other | Admitting: Internal Medicine

## 2012-07-19 VITALS — BP 130/72 | HR 64 | Ht 68.0 in | Wt 209.0 lb

## 2012-07-19 DIAGNOSIS — K279 Peptic ulcer, site unspecified, unspecified as acute or chronic, without hemorrhage or perforation: Secondary | ICD-10-CM

## 2012-07-19 DIAGNOSIS — K921 Melena: Secondary | ICD-10-CM

## 2012-07-19 LAB — CBC WITH DIFFERENTIAL/PLATELET
Basophils Absolute: 0 10*3/uL (ref 0.0–0.1)
Basophils Relative: 0 % (ref 0–1)
Eosinophils Absolute: 0.1 10*3/uL (ref 0.0–0.7)
Eosinophils Relative: 2 % (ref 0–5)
HCT: 33.8 % — ABNORMAL LOW (ref 36.0–46.0)
Hemoglobin: 11.6 g/dL — ABNORMAL LOW (ref 12.0–15.0)
Lymphocytes Relative: 42 % (ref 12–46)
Lymphs Abs: 2 10*3/uL (ref 0.7–4.0)
MCH: 29.4 pg (ref 26.0–34.0)
MCHC: 34.3 g/dL (ref 30.0–36.0)
MCV: 85.6 fL (ref 78.0–100.0)
Monocytes Absolute: 0.3 10*3/uL (ref 0.1–1.0)
Monocytes Relative: 7 % (ref 3–12)
Neutro Abs: 2.3 10*3/uL (ref 1.7–7.7)
Neutrophils Relative %: 49 % (ref 43–77)
Platelets: 226 10*3/uL (ref 150–400)
RBC: 3.95 MIL/uL (ref 3.87–5.11)
RDW: 15.9 % — ABNORMAL HIGH (ref 11.5–15.5)
WBC: 4.8 10*3/uL (ref 4.0–10.5)

## 2012-07-19 MED ORDER — PANTOPRAZOLE SODIUM 40 MG PO TBEC: 40.0000 mg | DELAYED_RELEASE_TABLET | Freq: Two times a day (BID) | ORAL | Status: DC

## 2012-07-19 NOTE — Patient Instructions (Addendum)
EGD with Dr. Rehman. 

## 2012-07-19 NOTE — Progress Notes (Signed)
Subjective:     Patient ID: Debra Sanchez, female   DOB: 05-19-1940, 72 y.o.   MRN: [id number]  HPI Here today for f/u.  She thinks she had some rectal bleeding last week. She says her stools were like tar and sticky last week (sunday).  Her stools were black. She tells me her stools were black x 1 days. She tells me she thinks she knows when her stomach is bleeding. She will hear a gurgling. Appetite is good. No weight loss.  She is not taking NSAIDs at this time. At night she feels "acid".  Last seen 05/15/2012: Referred to our office by Dr. Juanetta Sanchez for blood in her stool. I have discussed this case with Dr. Karilyn Sanchez.  . One out of 3 Hemocult cards were positive.  She has a hx of anemia. She tells me she has a hx of ulcer. In July 2012, [facility] was [facility] She received 4 unit of PRBCs. She refused EGD at [facility] and went to [facility].  10/29/10 EGD Dr. Karilyn Sanchez for upper GI bleed:FINAL DIAGNOSIS: Moderate size sliding hiatal hernia with erosions and  an ulcer at the level of hiatus. Ulcer with stigmata of bleeding and  was sealed with Gold probe.  Multiple antral erosions with three ulcers and two had stigmata of bleed and these were treated also with Gold probe.  01/2011 EGD and Colonoscopy: Impression:  Moderate size sliding hiatal hernia with linear erosions at the level of hiatus.  All 3 ulcers have healed; 2 of which were at antrum and one was at the level of hiatus.  Normal terminal ileum.  6 mm polyp snared from proximal sigmoid colon  Biopsy: Tubular adenoma. No high grade dysplasia.  Noted H and H 9.4 and 29.8, MCV 8 03/22/2012     CBC    Component Value Date/Time   WBC 5.4 06/13/2012 1035   RBC 4.22 06/13/2012 1035   HGB 11.6* 06/13/2012 1035   HCT 35.9* 06/13/2012 1035   PLT 258 06/13/2012 1035   MCV 85.1 06/13/2012 1035   MCH 27.5 06/13/2012 1035   MCHC 32.3 06/13/2012 1035   RDW 17.0* 06/13/2012 1035   LYMPHSABS 2.1 05/15/2012 1020   MONOABS 0.3 05/15/2012 1020   EOSABS 0.1 05/15/2012 1020   BASOSABS 0.0 05/15/2012 1020   Iron/TIBC/Ferritin    Component Value Date/Time   IRON 197* 05/15/2012 1020   TIBC 297 05/15/2012 1020   FERRITIN 6* 05/15/2012 1020     Review of Systems Current Outpatient Prescriptions on File Prior to Visit  Medication Sig Dispense Refill  . ferrous gluconate (FERGON) 325 MG tablet Take 325 mg by mouth daily with breakfast.      . levothyroxine (SYNTHROID, LEVOTHROID) 50 MCG tablet Take 50 mcg by mouth daily.      . MULTIPLE VITAMINS PO Take by mouth daily.        . Omega-3-6-9 CAPS Take 1 capsule by mouth daily.       . pantoprazole (PROTONIX) 40 MG tablet Take 40 mg by mouth daily.      . sertraline (ZOLOFT) 100 MG tablet Take 50 mg by mouth daily.        . pantoprazole (PROTONIX) 40 MG tablet Take 40 mg by mouth 2 (two) times daily. Taper tonics 30 minutes before breakfast and 30 units before evening meal.       No current facility-administered medications on file prior to visit.  ' Past Medical History  Diagnosis Date  . Hypertension   .  Anemia   . Anxiety   . Arthritis   . Blood transfusion    Past Surgical History  Procedure Laterality Date  . Abdominal hysterectomy    . Bilateral oophorectomy    . Neck surgery    . 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 Esophagogastroduodenoscopy  11/05/2010    Procedure: ESOPHAGOGASTRODUODENOSCOPY (EGD);  Surgeon: Debra Hippo, MD;  [facility];  Service: Endoscopy;  Laterality: N/A;  7:00 am per [facility]  . Colonoscopy  01/19/2011    Procedure: COLONOSCOPY;  Surgeon: Debra Hippo, MD;  [facility];  Service: Endoscopy;  Laterality: N/A;  2:00   Allergies  Allergen Reactions  . Codeine Itching  . Morphine And Related Itching  . Sulfa Antibiotics Nausea And Vomiting       Objective:   Physical Exam  Filed Vitals:   07/19/12 1047  BP: 130/72  Pulse: 64  Height: 5\' 8"  (1.727 m)  Weight: 209 lb (94.802  kg)   Alert and oriented. Skin warm and dry. Oral mucosa is moist.   . Sclera anicteric, conjunctivae is pink. Thyroid not enlarged. No cervical lymphadenopathy. Lungs clear. Heart regular rate and rhythm.  Abdomen is soft. Bowel sounds are positive. No hepatomegaly. No abdominal masses felt. No tenderness.  No edema to lower extremities.  Stool brown and guaiac negative.     Assessment:    I have discussed this case with Dr. Karilyn Sanchez earlier. PUD needs to be ruled out.  Hx of UPD    Plan:    EGD with Dr. Karilyn Sanchez. CBC today

## 2012-07-23 ENCOUNTER — Encounter (HOSPITAL_COMMUNITY): Payer: Self-pay | Admitting: Pharmacy Technician

## 2012-07-25 ENCOUNTER — Encounter (HOSPITAL_COMMUNITY)
Admission: RE | Disposition: A | Discharge status: Home / Self Care | Payer: Self-pay | Source: Ambulatory Visit | Attending: Internal Medicine

## 2012-07-25 ENCOUNTER — Ambulatory Visit (HOSPITAL_COMMUNITY)
Admission: RE | Admit: 2012-07-25 | Discharge: 2012-07-25 | Disposition: A | Discharge status: Home / Self Care | Payer: Medicare Other | Source: Ambulatory Visit | Attending: Internal Medicine | Admitting: Internal Medicine

## 2012-07-25 ENCOUNTER — Encounter (HOSPITAL_COMMUNITY): Payer: Self-pay | Admitting: *Deleted

## 2012-07-25 ENCOUNTER — Telehealth (INDEPENDENT_AMBULATORY_CARE_PROVIDER_SITE_OTHER): Payer: Self-pay | Admitting: *Deleted

## 2012-07-25 DIAGNOSIS — K2990 Gastroduodenitis, unspecified, without bleeding: Secondary | ICD-10-CM | POA: Insufficient documentation

## 2012-07-25 DIAGNOSIS — Z8711 Personal history of peptic ulcer disease: Secondary | ICD-10-CM

## 2012-07-25 DIAGNOSIS — K2960 Other gastritis without bleeding: Secondary | ICD-10-CM

## 2012-07-25 DIAGNOSIS — D649 Anemia, unspecified: Secondary | ICD-10-CM

## 2012-07-25 DIAGNOSIS — K921 Melena: Secondary | ICD-10-CM

## 2012-07-25 DIAGNOSIS — K31819 Angiodysplasia of stomach and duodenum without bleeding: Secondary | ICD-10-CM

## 2012-07-25 DIAGNOSIS — K449 Diaphragmatic hernia without obstruction or gangrene: Secondary | ICD-10-CM

## 2012-07-25 DIAGNOSIS — K2970 Gastritis, unspecified, without bleeding: Secondary | ICD-10-CM | POA: Insufficient documentation

## 2012-07-25 DIAGNOSIS — I10 Essential (primary) hypertension: Secondary | ICD-10-CM | POA: Insufficient documentation

## 2012-07-25 HISTORY — DX: Hypothyroidism, unspecified: E03.9

## 2012-07-25 HISTORY — PX: ESOPHAGOGASTRODUODENOSCOPY: SHX5428

## 2012-07-25 HISTORY — DX: Gastric ulcer, unspecified as acute or chronic, without hemorrhage or perforation: K25.9

## 2012-07-25 SURGERY — EGD (ESOPHAGOGASTRODUODENOSCOPY)
Anesthesia: Moderate Sedation

## 2012-07-25 MED ORDER — MEPERIDINE HCL 25 MG/ML IJ SOLN
INTRAMUSCULAR | Status: DC | PRN
  Administered 2012-07-25 (×2): 25 mg via INTRAVENOUS

## 2012-07-25 MED ORDER — STERILE WATER FOR IRRIGATION IR SOLN
Status: DC | PRN
  Administered 2012-07-25: 12:00:00

## 2012-07-25 MED ORDER — MIDAZOLAM HCL 5 MG/5ML IJ SOLN
INTRAMUSCULAR | Status: AC
  Filled 2012-07-25: qty 10

## 2012-07-25 MED ORDER — MEPERIDINE HCL 50 MG/ML IJ SOLN
INTRAMUSCULAR | Status: AC
  Filled 2012-07-25: qty 1

## 2012-07-25 MED ORDER — BUTAMBEN-TETRACAINE-BENZOCAINE 2-2-14 % EX AERO
INHALATION_SPRAY | CUTANEOUS | Status: DC | PRN
  Administered 2012-07-25: 2 via TOPICAL

## 2012-07-25 MED ORDER — MIDAZOLAM HCL 5 MG/5ML IJ SOLN
INTRAMUSCULAR | Status: DC | PRN
  Administered 2012-07-25 (×2): 2 mg via INTRAVENOUS

## 2012-07-25 MED ORDER — SODIUM CHLORIDE 0.9 % IV SOLN
INTRAVENOUS | Status: DC
  Administered 2012-07-25: 12:00:00 via INTRAVENOUS

## 2012-07-25 NOTE — H&P (Signed)
Debra Sanchez is an 72 y.o. female.   Chief Complaint: Patient is here for EGD. HPI: Patient is 72 year old Caucasian female who has history of peptic ulcer disease and chronic GERD. She was found to have 3 gastric ulcers back in July 2012 and she presented with GI bleed anemia requiring transfusion. Ulcer that healed a followup exam October 2012. She still had an inflammation of the level of hiatus. In December her hemoglobin was down to 9.4. She has had intermittent tarry stools. She denies anorexia weight loss or abdominal pain. She says her heartburn is well-controlled and she rarely has regurgitation. Patient's last colonoscopy was in October 2012 with removal of single small adenoma. H&H on 07/19/2012 was 11.6 and 33.8.  Past Medical History  Diagnosis Date  . Hypertension   . Anemia   . Anxiety   . Arthritis   . Blood transfusion   . Gastric ulcer   . Hypothyroidism   . Depression     Past Surgical History  Procedure Laterality Date  . Abdominal hysterectomy    . Bilateral oophorectomy    . Neck surgery    . 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 Esophagogastroduodenoscopy  11/05/2010    Procedure: ESOPHAGOGASTRODUODENOSCOPY (EGD);  Surgeon: Malissa Hippo, MD;  [facility];  Service: Endoscopy;  Laterality: N/A;  7:00 am per [facility]  . Colonoscopy  01/19/2011    Procedure: COLONOSCOPY;  Surgeon: Malissa Hippo, MD;  [facility];  Service: Endoscopy;  Laterality: N/A;  2:00    Family History  Problem Relation Age of Onset  . Colon cancer Mother    Social History:  reports that she has never smoked. She has never used smokeless tobacco. She reports that she does not drink alcohol or use illicit drugs.  Allergies:  Allergies  Allergen Reactions  . Codeine Itching  . Morphine And Related Itching  . Sulfa Antibiotics Nausea And Vomiting    Medications Prior to Admission  Medication Sig Dispense Refill   . ferrous gluconate (FERGON) 325 MG tablet Take 325 mg by mouth daily with breakfast.      . levothyroxine (SYNTHROID, LEVOTHROID) 50 MCG tablet Take 50 mcg by mouth daily.      . MULTIPLE VITAMINS PO Take 1 tablet by mouth daily.       Marland Kitchen olmesartan-hydrochlorothiazide (BENICAR HCT) 40-12.5 MG per tablet Take 1 tablet by mouth daily.      . Omega-3-6-9 CAPS Take 1 capsule by mouth daily.       . pantoprazole (PROTONIX) 40 MG tablet Take 1 tablet (40 mg total) by mouth 2 (two) times daily before a meal.  60 tablet  3  . sertraline (ZOLOFT) 100 MG tablet Take 50 mg by mouth daily.        . pantoprazole (PROTONIX) 40 MG tablet Take 40 mg by mouth 2 (two) times daily. Taper tonics 30 minutes before breakfast and 30 units before evening meal.      . pantoprazole (PROTONIX) 40 MG tablet Take 40 mg by mouth 2 (two) times daily.         No results found for this or any previous visit (from the past 48 hour(s)). No results found.  ROS  Blood pressure 133/80, pulse 72, temperature 97.6 F (36.4 C), temperature source Oral, resp. rate 18, height 5\' 8"  (1.727 m), weight 209 lb (94.802 kg), SpO2 97.00%. Physical Exam  Constitutional: She  appears well-developed and well-nourished.  HENT:  Mouth/Throat: Oropharynx is clear and moist.  Eyes: Conjunctivae are normal. No scleral icterus.  Neck: No thyromegaly present.  Cardiovascular: Normal rate, regular rhythm and normal heart sounds.   No murmur heard. Respiratory: Effort normal and breath sounds normal.  GI: Soft. She exhibits no distension and no mass. There is no tenderness.  Musculoskeletal: She exhibits no edema.  Lymphadenopathy:    She has no cervical adenopathy.  Neurological: She is alert.  Skin: Skin is warm and dry.     Assessment/Plan History of melena and anemia. History of peptic ulcer disease and GERD.  Diagnostic EGD.  Patches Mcdonnell U 07/25/2012, 12:15 PM

## 2012-07-25 NOTE — Op Note (Signed)
EGD PROCEDURE REPORT  PATIENT:  Debra Sanchez  MR#:  [id number] Birthdate:  [birth year], 72 y.o., female Endoscopist:  Dr. Malissa Hippo, MD Referred By:  Dr. Oneal Deputy. Juanetta Gosling, MD Procedure Date: 07/25/2012  Procedure:   EGD  Indications:  Patient is 72 year old Caucasian female with history of peptic ulcer disease and known large hiatal hernia who presents with intermittent melena and drop in her H&H. She is undergoing diagnostic EGD. She says her heartburn is well controlled with therapy. Her PPI dose was doubled at the time of office visit.            Informed Consent:  The risks, benefits, alternatives & imponderables which include, but are not limited to, bleeding, infection, perforation, drug reaction and potential missed lesion have been reviewed.  The potential for biopsy, lesion removal, esophageal dilation, etc. have also been discussed.  Questions have been answered.  All parties agreeable.  Please see history & physical in medical record for more information.  Medications:  Demerol 50 mg IV Versed 4 mg IV Cetacaine spray topically for oropharyngeal anesthesia  Description of procedure:  The endoscope was introduced through the mouth and advanced to the second portion of the duodenum without difficulty or limitations. The mucosal surfaces were surveyed very carefully during advancement of the scope and upon withdrawal.  Findings:  Esophagus:  Mucosa the esophagus was normal. GE junction was unremarkable. Large sliding hiatal hernia with dependent segment on the left side but mucosa was normal. Focal mucosal edema and erythema noted at the level of hiatus but no erosions or ulcers noted. GEJ:  34 cm Hiatus:  41 cm Stomach:  Stomach was empty and distended very well with insufflation. Folds in the proximal stomach were normal. Examination of mucosa at body was normal. Few small telangiectasia noted in the prepyloric region without stigmata of bleed. Pyloric channel was  patent. Angularis was unremarkable. Hernia was well-seen on retroflexed view and no ulcer or other abnormalities noted other than focal edema and erythema as noted above. Duodenum:  Normal bulbar and post bulbar mucosa.  Therapeutic/Diagnostic Maneuvers Performed:  None  Complications:  None  Impression: No evidence of erosive esophagitis. Large sliding hiatal hernia with focal gastritis at the level of hiatus and no erosions or ulcers noted. Few small telangiectasia noted in the prepyloric region without bleeding. No evidence of peptic ulcer disease.  Recommendations:  Continue anti-reflux measures. Continue pantoprazole at 40 mg by mouth twice a day. Continue ferrous gluconate 325 mg by mouth daily. CBC and Hemoccults in one month.   REHMAN,NAJEEB U  07/25/2012  12:40 PM  CC: Dr. Fredirick Maudlin, MD & Dr. Bonnetta Barry ref. provider found

## 2012-07-25 NOTE — Telephone Encounter (Signed)
Per Dr.Rehman the patient will need to have labs drawn in one month. She will also need a hemoccult.

## 2012-07-26 ENCOUNTER — Other Ambulatory Visit (INDEPENDENT_AMBULATORY_CARE_PROVIDER_SITE_OTHER): Payer: Self-pay | Admitting: *Deleted

## 2012-07-26 ENCOUNTER — Encounter (INDEPENDENT_AMBULATORY_CARE_PROVIDER_SITE_OTHER): Payer: Self-pay | Admitting: *Deleted

## 2012-07-26 DIAGNOSIS — K921 Melena: Secondary | ICD-10-CM

## 2012-07-26 DIAGNOSIS — D649 Anemia, unspecified: Secondary | ICD-10-CM

## 2012-07-30 ENCOUNTER — Encounter (HOSPITAL_COMMUNITY): Payer: Self-pay | Admitting: Internal Medicine

## 2012-09-11 LAB — CBC WITH DIFFERENTIAL/PLATELET
Basophils Absolute: 0 10*3/uL (ref 0.0–0.1)
Basophils Relative: 0 % (ref 0–1)
Eosinophils Absolute: 0.1 10*3/uL (ref 0.0–0.7)
Eosinophils Relative: 1 % (ref 0–5)
HCT: 38 % (ref 36.0–46.0)
Hemoglobin: 12.5 g/dL (ref 12.0–15.0)
Lymphocytes Relative: 39 % (ref 12–46)
Lymphs Abs: 2.1 10*3/uL (ref 0.7–4.0)
MCH: 29 pg (ref 26.0–34.0)
MCHC: 32.9 g/dL (ref 30.0–36.0)
MCV: 88.2 fL (ref 78.0–100.0)
Monocytes Absolute: 0.4 10*3/uL (ref 0.1–1.0)
Monocytes Relative: 8 % (ref 3–12)
Neutro Abs: 2.7 10*3/uL (ref 1.7–7.7)
Neutrophils Relative %: 52 % (ref 43–77)
Platelets: 238 10*3/uL (ref 150–400)
RBC: 4.31 MIL/uL (ref 3.87–5.11)
RDW: 14.5 % (ref 11.5–15.5)
WBC: 5.3 10*3/uL (ref 4.0–10.5)

## 2012-09-13 ENCOUNTER — Telehealth (INDEPENDENT_AMBULATORY_CARE_PROVIDER_SITE_OTHER): Payer: Self-pay | Admitting: *Deleted

## 2012-09-13 DIAGNOSIS — K921 Melena: Secondary | ICD-10-CM

## 2012-09-13 DIAGNOSIS — D649 Anemia, unspecified: Secondary | ICD-10-CM

## 2012-09-13 NOTE — Telephone Encounter (Signed)
Per Dr.Rehman the patient will need to have labs drawn in 6 months. 

## 2013-02-27 ENCOUNTER — Encounter (INDEPENDENT_AMBULATORY_CARE_PROVIDER_SITE_OTHER): Payer: Self-pay | Admitting: *Deleted

## 2013-02-27 ENCOUNTER — Other Ambulatory Visit (INDEPENDENT_AMBULATORY_CARE_PROVIDER_SITE_OTHER): Payer: Self-pay | Admitting: *Deleted

## 2013-02-27 DIAGNOSIS — D649 Anemia, unspecified: Secondary | ICD-10-CM

## 2013-02-27 DIAGNOSIS — K921 Melena: Secondary | ICD-10-CM

## 2013-08-28 ENCOUNTER — Other Ambulatory Visit (INDEPENDENT_AMBULATORY_CARE_PROVIDER_SITE_OTHER): Payer: Self-pay | Admitting: Internal Medicine

## 2013-08-29 ENCOUNTER — Other Ambulatory Visit (INDEPENDENT_AMBULATORY_CARE_PROVIDER_SITE_OTHER): Payer: Self-pay | Admitting: Internal Medicine

## 2013-08-29 DIAGNOSIS — K279 Peptic ulcer, site unspecified, unspecified as acute or chronic, without hemorrhage or perforation: Secondary | ICD-10-CM

## 2013-08-29 MED ORDER — PANTOPRAZOLE SODIUM 40 MG PO TBEC: 40.0000 mg | DELAYED_RELEASE_TABLET | Freq: Two times a day (BID) | ORAL | Status: DC

## 2014-01-28 ENCOUNTER — Encounter (HOSPITAL_COMMUNITY): Payer: Self-pay

## 2014-01-28 NOTE — Patient Instructions (Signed)
Debra Sanchez  01/28/2014   Your procedure is scheduled on:  02/03/2014  Report to [facility] at 8:30 AM.  Call this number if you have problems the morning of surgery: [phone]   Remember:   Do not eat food or drink liquids after midnight.   Take these medicines the morning of surgery with A SIP OF WATER: Synthroid, Benicar, Protonix, Zoloft   Do not wear jewelry, make-up or nail polish.  Do not wear lotions, powders, or perfumes. You may wear deodorant.  Do not shave 48 hours prior to surgery. Men may shave face and neck.  Do not bring valuables to the hospital.  [facility] is not responsible for any belongings or valuables.               Contacts, dentures or bridgework may not be worn into surgery.  Leave suitcase in the car. After surgery it may be brought to your room.  For patients admitted to the hospital, discharge time is determined by your                treatment team.               Patients discharged the day of surgery will not be allowed to drive  home.  Name and phone number of your driver:   Special Instructions: N/A   Please read over the following fact sheets that you were given: Anesthesia Post-op Instructions   PATIENT INSTRUCTIONS POST-ANESTHESIA  IMMEDIATELY FOLLOWING SURGERY:  Do not drive or operate machinery for the first twenty four hours after surgery.  Do not make any important decisions for twenty four hours after surgery or while taking narcotic pain medications or sedatives.  If you develop intractable nausea and vomiting or a severe headache please notify your doctor immediately.  FOLLOW-UP:  Please make an appointment with your surgeon as instructed. You do not need to follow up with anesthesia unless specifically instructed to do so.  WOUND CARE INSTRUCTIONS (if applicable):  Keep a dry clean dressing on the anesthesia/puncture wound site if there is drainage.  Once the wound has quit draining you may leave it open to air.  Generally  you should leave the bandage intact for twenty four hours unless there is drainage.  If the epidural site drains for more than 36-48 hours please call the anesthesia department.  QUESTIONS?:  Please feel free to call your physician or the hospital operator if you have any questions, and they will be happy to assist you.      Cataract A cataract is a clouding of the lens of the eye. When a lens becomes cloudy, vision is reduced based on the degree and nature of the clouding. Many cataracts reduce vision to some degree. Some cataracts make people more near-sighted as they develop. Other cataracts increase glare. Cataracts that are ignored and become worse can sometimes look white. The white color can be seen through the pupil. CAUSES   Aging. However, cataracts may occur at any age, even in newborns.  Certain drugs.  Trauma to the eye.  Certain diseases such as diabetes.  Specific eye diseases such as chronic inflammation inside the eye or a sudden attack of a rare form of glaucoma.  Inherited or acquired medical problems. SYMPTOMS   Gradual, progressive drop in vision in the affected eye.  Severe, rapid visual loss. This most often happens when trauma is the cause. DIAGNOSIS  To detect a cataract, an eye doctor examines the lens.  Cataracts are best diagnosed with an exam of the eyes with the pupils enlarged (dilated) by drops.  TREATMENT  For an early cataract, vision may improve by using different eyeglasses or stronger lighting. If that does not help your vision, surgery is the only effective treatment. A cataract needs to be surgically removed when vision loss interferes with your everyday activities, such as driving, reading, or watching TV. A cataract may also have to be removed if it prevents examination or treatment of another eye problem. Surgery removes the cloudy lens and usually replaces it with a substitute lens (intraocular lens, IOL).  At a time when both you and your doctor  agree, the cataract will be surgically removed. If you have cataracts in both eyes, only one is usually removed at a time. This allows the operated eye to heal and be out of danger from any possible problems after surgery (such as infection or poor wound healing). In rare cases, a cataract may be doing damage to your eye. In these cases, your caregiver may advise surgical removal right away. The vast majority of people who have cataract surgery have better vision afterward. HOME CARE INSTRUCTIONS  If you are not planning surgery, you may be asked to do the following:  Use different eyeglasses.  Use stronger or brighter lighting.  Ask your eye doctor about reducing your medicine dose or changing medicines if it is thought that a medicine caused your cataract. Changing medicines does not make the cataract go away on its own.  Become familiar with your surroundings. Poor vision can lead to injury. Avoid bumping into things on the affected side. You are at a higher risk for tripping or falling.  Exercise extreme care when driving or operating machinery.  Wear sunglasses if you are sensitive to bright light or experiencing problems with glare. SEEK IMMEDIATE MEDICAL CARE IF:   You have a worsening or sudden vision loss.  You notice redness, swelling, or increasing pain in the eye.  You have a fever. Document Released: 03/28/2005 Document Revised: 06/20/2011 Document Reviewed: 11/19/2010 [facility] Patient Information 2015 [place], [place]. This information is not intended to replace advice given to you by your health care provider. Make sure you discuss any questions you have with your health care provider.

## 2014-01-29 ENCOUNTER — Encounter (HOSPITAL_COMMUNITY): Payer: Self-pay

## 2014-01-29 ENCOUNTER — Other Ambulatory Visit: Payer: Self-pay

## 2014-01-29 ENCOUNTER — Encounter (HOSPITAL_COMMUNITY)
Admission: RE | Admit: 2014-01-29 | Discharge: 2014-01-29 | Disposition: A | Discharge status: Home / Self Care | Payer: Medicare Other | Source: Ambulatory Visit | Attending: Ophthalmology | Admitting: Ophthalmology

## 2014-01-29 ENCOUNTER — Encounter (HOSPITAL_COMMUNITY): Payer: Self-pay | Admitting: Pharmacy Technician

## 2014-01-29 DIAGNOSIS — H547 Unspecified visual loss: Secondary | ICD-10-CM | POA: Diagnosis not present

## 2014-01-29 DIAGNOSIS — Z01818 Encounter for other preprocedural examination: Secondary | ICD-10-CM | POA: Diagnosis not present

## 2014-01-29 HISTORY — DX: Gastro-esophageal reflux disease without esophagitis: K21.9

## 2014-01-29 LAB — BASIC METABOLIC PANEL
Anion gap: 10 (ref 5–15)
BUN: 20 mg/dL (ref 6–23)
CO2: 26 mEq/L (ref 19–32)
Calcium: 9.1 mg/dL (ref 8.4–10.5)
Chloride: 104 mEq/L (ref 96–112)
Creatinine, Ser: 1.01 mg/dL (ref 0.50–1.10)
GFR calc Af Amer: 62 mL/min — ABNORMAL LOW (ref >90)
GFR calc non Af Amer: 53 mL/min — ABNORMAL LOW (ref >90)
Glucose, Bld: 93 mg/dL (ref 70–99)
Potassium: 4.5 mEq/L (ref 3.7–5.3)
Sodium: 140 mEq/L (ref 137–147)

## 2014-01-29 LAB — HEMOGLOBIN AND HEMATOCRIT, BLOOD
HCT: 30.1 % — ABNORMAL LOW (ref 36.0–46.0)
Hemoglobin: 9.6 g/dL — ABNORMAL LOW (ref 12.0–15.0)

## 2014-01-29 NOTE — Progress Notes (Signed)
01/29/14 1328  OBSTRUCTIVE SLEEP APNEA  Have you ever been diagnosed with sleep apnea through a sleep study? No  Do you snore loudly (loud enough to be heard through closed doors)?  1  Do you often feel tired, fatigued, or sleepy during the daytime? 0  Has anyone observed you stop breathing during your sleep? 1  Do you have, or are you being treated for high blood pressure? 1  BMI more than 35 kg/m2? 0  Age over 74 years old? 1  Neck circumference greater than 40 cm/16 inches? 0  Gender: 0  Obstructive Sleep Apnea Score 4  Score 4 or greater  Results sent to PCP

## 2014-01-29 NOTE — Pre-Procedure Instructions (Signed)
Patient given information to sign up for my chart at home. 

## 2014-01-31 MED ORDER — LIDOCAINE HCL 3.5 % OP GEL
OPHTHALMIC | Status: AC
  Filled 2014-01-31: qty 1

## 2014-01-31 MED ORDER — TETRACAINE HCL 0.5 % OP SOLN
OPHTHALMIC | Status: AC
  Filled 2014-01-31: qty 2

## 2014-01-31 MED ORDER — CYCLOPENTOLATE-PHENYLEPHRINE OP SOLN OPTIME - NO CHARGE
OPHTHALMIC | Status: AC
  Filled 2014-01-31: qty 2

## 2014-01-31 MED ORDER — PHENYLEPHRINE HCL 2.5 % OP SOLN
OPHTHALMIC | Status: AC
  Filled 2014-01-31: qty 15

## 2014-01-31 MED ORDER — LIDOCAINE HCL (PF) 1 % IJ SOLN
INTRAMUSCULAR | Status: AC
  Filled 2014-01-31: qty 2

## 2014-01-31 MED ORDER — NEOMYCIN-POLYMYXIN-DEXAMETH 3.5-10000-0.1 OP SUSP
OPHTHALMIC | Status: AC
  Filled 2014-01-31: qty 5

## 2014-02-03 ENCOUNTER — Ambulatory Visit (HOSPITAL_COMMUNITY)
Admission: EM | Admit: 2014-02-03 | Discharge: 2014-02-03 | Disposition: A | Discharge status: Home / Self Care | Payer: Medicare Other | Source: Ambulatory Visit | Attending: Emergency Medicine | Admitting: Emergency Medicine

## 2014-02-03 ENCOUNTER — Encounter (HOSPITAL_COMMUNITY): Payer: Self-pay | Admitting: Anesthesiology

## 2014-02-03 ENCOUNTER — Encounter (HOSPITAL_COMMUNITY)
Admission: EM | Disposition: A | Discharge status: Home / Self Care | Payer: Medicare Other | Source: Ambulatory Visit | Attending: Emergency Medicine

## 2014-02-03 ENCOUNTER — Encounter (HOSPITAL_COMMUNITY): Payer: Self-pay | Admitting: *Deleted

## 2014-02-03 DIAGNOSIS — F329 Major depressive disorder, single episode, unspecified: Secondary | ICD-10-CM | POA: Diagnosis not present

## 2014-02-03 DIAGNOSIS — M1990 Unspecified osteoarthritis, unspecified site: Secondary | ICD-10-CM | POA: Insufficient documentation

## 2014-02-03 DIAGNOSIS — H269 Unspecified cataract: Secondary | ICD-10-CM | POA: Diagnosis not present

## 2014-02-03 DIAGNOSIS — K219 Gastro-esophageal reflux disease without esophagitis: Secondary | ICD-10-CM | POA: Diagnosis not present

## 2014-02-03 DIAGNOSIS — E039 Hypothyroidism, unspecified: Secondary | ICD-10-CM | POA: Diagnosis not present

## 2014-02-03 DIAGNOSIS — R55 Syncope and collapse: Secondary | ICD-10-CM | POA: Diagnosis not present

## 2014-02-03 DIAGNOSIS — D649 Anemia, unspecified: Secondary | ICD-10-CM | POA: Insufficient documentation

## 2014-02-03 DIAGNOSIS — I10 Essential (primary) hypertension: Secondary | ICD-10-CM | POA: Diagnosis not present

## 2014-02-03 DIAGNOSIS — Z5309 Procedure and treatment not carried out because of other contraindication: Secondary | ICD-10-CM | POA: Diagnosis not present

## 2014-02-03 DIAGNOSIS — F419 Anxiety disorder, unspecified: Secondary | ICD-10-CM | POA: Diagnosis not present

## 2014-02-03 DIAGNOSIS — R51 Headache: Secondary | ICD-10-CM | POA: Insufficient documentation

## 2014-02-03 DIAGNOSIS — Z79899 Other long term (current) drug therapy: Secondary | ICD-10-CM | POA: Insufficient documentation

## 2014-02-03 LAB — CBC WITH DIFFERENTIAL/PLATELET
Basophils Absolute: 0 10*3/uL (ref 0.0–0.1)
Basophils Relative: 0 % (ref 0–1)
Eosinophils Absolute: 0.1 10*3/uL (ref 0.0–0.7)
Eosinophils Relative: 2 % (ref 0–5)
HCT: 29.6 % — ABNORMAL LOW (ref 36.0–46.0)
Hemoglobin: 9.3 g/dL — ABNORMAL LOW (ref 12.0–15.0)
Lymphocytes Relative: 36 % (ref 12–46)
Lymphs Abs: 1.9 10*3/uL (ref 0.7–4.0)
MCH: 27.7 pg (ref 26.0–34.0)
MCHC: 31.4 g/dL (ref 30.0–36.0)
MCV: 88.1 fL (ref 78.0–100.0)
Monocytes Absolute: 0.2 10*3/uL (ref 0.1–1.0)
Monocytes Relative: 4 % (ref 3–12)
Neutro Abs: 3.1 10*3/uL (ref 1.7–7.7)
Neutrophils Relative %: 58 % (ref 43–77)
Platelets: 208 10*3/uL (ref 150–400)
RBC: 3.36 MIL/uL — ABNORMAL LOW (ref 3.87–5.11)
RDW: 13 % (ref 11.5–15.5)
WBC: 5.3 10*3/uL (ref 4.0–10.5)

## 2014-02-03 LAB — BASIC METABOLIC PANEL
Anion gap: 9 (ref 5–15)
BUN: 21 mg/dL (ref 6–23)
CO2: 25 mEq/L (ref 19–32)
Calcium: 8.7 mg/dL (ref 8.4–10.5)
Chloride: 107 mEq/L (ref 96–112)
Creatinine, Ser: 0.85 mg/dL (ref 0.50–1.10)
GFR calc Af Amer: 76 mL/min — ABNORMAL LOW (ref >90)
GFR calc non Af Amer: 66 mL/min — ABNORMAL LOW (ref >90)
Glucose, Bld: 137 mg/dL — ABNORMAL HIGH (ref 70–99)
Potassium: 4 mEq/L (ref 3.7–5.3)
Sodium: 141 mEq/L (ref 137–147)

## 2014-02-03 LAB — TROPONIN I
Troponin I: <0.3 ng/mL (ref <0.30)
Troponin I: <0.3 ng/mL (ref <0.30)

## 2014-02-03 LAB — MAGNESIUM: Magnesium: 2.1 mg/dL (ref 1.5–2.5)

## 2014-02-03 LAB — CBG MONITORING, ED: Glucose-Capillary: 144 mg/dL — ABNORMAL HIGH (ref 70–99)

## 2014-02-03 SURGERY — CANCELLED PROCEDURE

## 2014-02-03 MED ORDER — LIDOCAINE HCL 3.5 % OP GEL: 1.0000 "application " | Freq: Once | OPHTHALMIC | Status: DC

## 2014-02-03 MED ORDER — ONDANSETRON HCL 4 MG/2ML IJ SOLN
INTRAMUSCULAR | Status: AC
  Filled 2014-02-03: qty 2

## 2014-02-03 MED ORDER — PHENYLEPHRINE HCL 2.5 % OP SOLN: 1.0000 [drp] | OPHTHALMIC | Status: DC

## 2014-02-03 MED ORDER — ONDANSETRON HCL 4 MG/2ML IJ SOLN
4.0000 mg | Freq: Once | INTRAMUSCULAR | Status: AC
  Administered 2014-02-03: 4 mg via INTRAVENOUS

## 2014-02-03 MED ORDER — EPINEPHRINE HCL 1 MG/ML IJ SOLN
INTRAMUSCULAR | Status: AC
  Filled 2014-02-03: qty 1

## 2014-02-03 MED ORDER — SODIUM CHLORIDE 0.9 % IV SOLN
INTRAVENOUS | Status: DC
  Administered 2014-02-03: 09:00:00 via INTRAVENOUS

## 2014-02-03 MED ORDER — TETRACAINE HCL 0.5 % OP SOLN: 1.0000 [drp] | OPHTHALMIC | Status: DC

## 2014-02-03 MED ORDER — LACTATED RINGERS IV SOLN
INTRAVENOUS | Status: DC
  Administered 2014-02-03: 1000 mL via INTRAVENOUS

## 2014-02-03 MED ORDER — CYCLOPENTOLATE-PHENYLEPHRINE 0.2-1 % OP SOLN
1.0000 [drp] | OPHTHALMIC | Status: DC
  Administered 2014-02-03: 1 [drp] via OPHTHALMIC

## 2014-02-03 MED ORDER — KETOROLAC TROMETHAMINE 0.5 % OP SOLN: 1.0000 [drp] | OPHTHALMIC | Status: DC

## 2014-02-03 MED ORDER — ONDANSETRON HCL 4 MG/2ML IJ SOLN
4.0000 mg | Freq: Once | INTRAMUSCULAR | Status: AC
  Administered 2014-02-03: 4 mg via INTRAVENOUS
  Filled 2014-02-03: qty 2

## 2014-02-03 SURGICAL SUPPLY — 30 items
CAPSULAR TENSION RING-AMO (OPHTHALMIC RELATED) IMPLANT
CLOTH BEACON ORANGE TIMEOUT ST (SAFETY) IMPLANT
EYE SHIELD UNIVERSAL CLEAR (GAUZE/BANDAGES/DRESSINGS) IMPLANT
GLOVE BIO SURGEON STRL SZ 6.5 (GLOVE) IMPLANT
GLOVE BIOGEL PI IND STRL 6.5 (GLOVE) IMPLANT
GLOVE BIOGEL PI IND STRL 7.0 (GLOVE) IMPLANT
GLOVE BIOGEL PI IND STRL 7.5 (GLOVE) IMPLANT
GLOVE BIOGEL PI INDICATOR 6.5 (GLOVE)
GLOVE BIOGEL PI INDICATOR 7.0 (GLOVE)
GLOVE BIOGEL PI INDICATOR 7.5 (GLOVE)
GLOVE ECLIPSE 6.5 STRL STRAW (GLOVE) IMPLANT
GLOVE ECLIPSE 7.0 STRL STRAW (GLOVE) IMPLANT
GLOVE ECLIPSE 7.5 STRL STRAW (GLOVE) IMPLANT
GLOVE EXAM NITRILE LRG STRL (GLOVE) IMPLANT
GLOVE EXAM NITRILE MD LF STRL (GLOVE) IMPLANT
GLOVE SKINSENSE NS SZ6.5 (GLOVE)
GLOVE SKINSENSE NS SZ7.0 (GLOVE)
GLOVE SKINSENSE STRL SZ6.5 (GLOVE) IMPLANT
GLOVE SKINSENSE STRL SZ7.0 (GLOVE) IMPLANT
KIT VITRECTOMY (OPHTHALMIC RELATED) IMPLANT
PAD ARMBOARD 7.5X6 YLW CONV (MISCELLANEOUS) IMPLANT
PROC W NO LENS (INTRAOCULAR LENS)
PROC W SPEC LENS (INTRAOCULAR LENS)
PROCESS W NO LENS (INTRAOCULAR LENS) IMPLANT
PROCESS W SPEC LENS (INTRAOCULAR LENS) IMPLANT
RETRACTOR IRIS SIGHTPATH (OPHTHALMIC RELATED) IMPLANT
RING MALYGIN (MISCELLANEOUS) IMPLANT
SYRINGE LUER LOK 1CC (MISCELLANEOUS) IMPLANT
VISCOELASTIC ADDITIONAL (OPHTHALMIC RELATED) IMPLANT
WATER STERILE IRR 250ML POUR (IV SOLUTION) IMPLANT

## 2014-02-03 NOTE — ED Notes (Signed)
Patient states she does not need anything at this time. Patient drinking Sprite.

## 2014-02-03 NOTE — ED Provider Notes (Signed)
This chart was scribed for [place], DO by Rayfield Citizen, ED Scribe. This patient was seen in room APA05/APA05 and the patient's care was started at 8:50 AM.   TIME SEEN: 8:50 AM  CHIEF COMPLAINT: LOC  HPI:   HPI Comments: Debra Sanchez is a 73 y.o. female who presents to the Emergency Department complaining of syncope during IV initiation while in pre-op for cataract surgery. She explains that her symptoms came on suddenly; she felt nausea and "knew she was going to pass out." She feels nauseated at present. Her last meal/drink was last night. She denies blood in stools, black tarry stools, vaginal bleeding, numbness, tingling or weakness, headaches. She denies chest pain or discomfort, SOB, history of DVT or PE, heart palpitations, history of heart attack, or heart failure.  She has never had a syncopal event before.  ROS: See HPI Constitutional: no fever  Eyes: no drainage  ENT: no runny nose   Cardiovascular:  no chest pain  Resp: no SOB  GI: no vomiting GU: no dysuria Integumentary: no rash  Allergy: no hives  Musculoskeletal: no leg swelling  Neurological: no slurred speech ROS otherwise negative  PAST MEDICAL HISTORY/PAST SURGICAL HISTORY:  Past Medical History  Diagnosis Date  . Hypertension   . Anemia   . Anxiety   . Arthritis   . Blood transfusion   . Gastric ulcer   . Hypothyroidism   . Depression   . GERD (gastroesophageal reflux disease)     MEDICATIONS:  Prior to Admission medications   Medication Sig Start Date End Date Taking? Authorizing Provider  amLODipine (NORVASC) 2.5 MG tablet Take 2.5 mg by mouth daily.   Yes Historical Provider, MD  levothyroxine (SYNTHROID, LEVOTHROID) 50 MCG tablet Take 50 mcg by mouth daily.   Yes Historical Provider, MD  losartan-hydrochlorothiazide (HYZAAR) 100-12.5 MG per tablet Take 1 tablet by mouth daily.   Yes Historical Provider, MD  MULTIPLE VITAMINS PO Take 1 tablet by mouth daily.    Yes Historical  Provider, MD  Omega-3-6-9 CAPS Take 1 capsule by mouth daily.    Yes Historical Provider, MD  pantoprazole (PROTONIX) 40 MG tablet Take 40 mg by mouth daily as needed (Stomach).   Yes Historical Provider, MD  sertraline (ZOLOFT) 100 MG tablet Take 50 mg by mouth daily.    Yes Historical Provider, MD  SUMAtriptan Succinate (IMITREX PO) Take 1 tablet by mouth as needed. Migraines   Yes Historical Provider, MD    ALLERGIES:  Allergies  Allergen Reactions  . Codeine Itching  . Morphine And Related Itching  . Sulfa Antibiotics Nausea And Vomiting    SOCIAL HISTORY:  History  Substance Use Topics  . Smoking status: Never Smoker   . Smokeless tobacco: Never Used  . Alcohol Use: No    FAMILY HISTORY: Family History  Problem Relation Age of Onset  . Colon cancer Mother     EXAM: BP 160/84  Pulse 75  Temp(Src) 97.8 F (36.6 C) (Oral)  Resp 15  SpO2 97% CONSTITUTIONAL: Alert and oriented and responds appropriately to questions. Well-appearing; well-nourished HEAD: Normocephalic EYES: Conjunctivae clear, PERRL ENT: normal nose; no rhinorrhea; moist mucous membranes; pharynx without lesions noted NECK: Supple, no meningismus, no LAD  CARD: RRR; S1 and S2 appreciated; no murmurs, no clicks, no rubs, no gallops RESP: Normal chest excursion without splinting or tachypnea; breath sounds clear and equal bilaterally; no wheezes, no rhonchi, no rales,  ABD/GI: Normal bowel sounds; non-distended; soft, non-tender, no rebound,  no guarding BACK:  The back appears normal and is non-tender to palpation, there is no CVA tenderness EXT: Normal ROM in all joints; non-tender to palpation; no edema; normal capillary refill; no cyanosis    SKIN: Normal color for age and race; warm NEURO: Moves all extremities equally, no pronator drift, cranial nerves 2-12 intact, sensation normal in all extremities  PSYCH: The patient's mood and manner are appropriate. Grooming and personal hygiene are  appropriate.   EKG Interpretation  Date/Time:  Monday February 03 2014 08:52:09 EDT Ventricular Rate:  63 PR Interval:  221 QRS Duration: 106 QT Interval:  426 QTC Calculation: 436 R Axis:   60 Text Interpretation:  Sinus rhythm Prolonged PR interval Confirmed by Amanpreet Delmont,  DO, Jennavieve Arrick ([id number]) on 02/03/2014 8:57:56 AM      MEDICAL DECISION MAKING: Patient here would likely an episode of vasovagal syncope. She had no preceding symptoms except for nausea in this occurred when she was getting a peripheral IV started. however given her age and comorbidities, I will obtain cardiac labs and continue to monitor closely. We'll get IV fluids and Zofran.  Patient is comfortable with this plan.  ED PROGRESS: patient's labs are unremarkable. She has had two negative sets of cardiac enzymes. She reports she is feeling much better and has been able to tolerate po and ambulate without difficulty. I feel she is safe to be discharged home with close outpatient follow-up. Discussed return precautions. She verbalized understanding and is comfortable with this plan. She will be discharged home with her husband.  I personally performed the services described in this documentation, which was scribed in my presence. The recorded information has been reviewed and is accurate.     [place], DO 02/06/14 1305

## 2014-02-03 NOTE — ED Notes (Signed)
MD at bedside. 

## 2014-02-03 NOTE — ED Notes (Signed)
PT tolerating po fluids well and denies any nausea at this time. PT ate her cereal off her breakfast tray.

## 2014-02-03 NOTE — Discharge Instructions (Signed)
Syncope °Syncope is a medical term for fainting or passing out. This means you lose consciousness and drop to the ground. People are generally unconscious for less than 5 minutes. You may have some muscle twitches for up to 15 seconds before waking up and returning to normal. Syncope occurs more often in older adults, but it can happen to anyone. While most causes of syncope are not dangerous, syncope can be a sign of a serious medical problem. It is important to seek medical care.  °CAUSES  °Syncope is caused by a sudden drop in blood flow to the brain. The specific cause is often not determined. Factors that can bring on syncope include: °· Taking medicines that lower blood pressure. °· Sudden changes in posture, such as standing up quickly. °· Taking more medicine than prescribed. °· Standing in one place for too long. °· Seizure disorders. °· Dehydration and excessive exposure to heat. °· Low blood sugar (hypoglycemia). °· Straining to have a bowel movement. °· Heart disease, irregular heartbeat, or other circulatory problems. °· Fear, emotional distress, seeing blood, or severe pain. °SYMPTOMS  °Right before fainting, you may: °· Feel dizzy or light-headed. °· Feel nauseous. °· See all white or all black in your field of vision. °· Have cold, clammy skin. °DIAGNOSIS  °Your health care provider will ask about your symptoms, perform a physical exam, and perform an electrocardiogram (ECG) to record the electrical activity of your heart. Your health care provider may also perform other heart or blood tests to determine the cause of your syncope which may include: °· Transthoracic echocardiogram (TTE). During echocardiography, sound waves are used to evaluate how blood flows through your heart. °· Transesophageal echocardiogram (TEE). °· Cardiac monitoring. This allows your health care provider to monitor your heart rate and rhythm in real time. °· Holter monitor. This is a portable device that records your  heartbeat and can help diagnose heart arrhythmias. It allows your health care provider to track your heart activity for several days, if needed. °· Stress tests by exercise or by giving medicine that makes the heart beat faster. °TREATMENT  °In most cases, no treatment is needed. Depending on the cause of your syncope, your health care provider may recommend changing or stopping some of your medicines. °HOME CARE INSTRUCTIONS °· Have someone stay with you until you feel stable. °· Do not drive, use machinery, or play sports until your health care provider says it is okay. °· Keep all follow-up appointments as directed by your health care provider. °· Lie down right away if you start feeling like you might faint. Breathe deeply and steadily. Wait until all the symptoms have passed. °· Drink enough fluids to keep your urine clear or pale yellow. °· If you are taking blood pressure or heart medicine, get up slowly and take several minutes to sit and then stand. This can reduce dizziness. °SEEK IMMEDIATE MEDICAL CARE IF:  °· You have a severe headache. °· You have unusual pain in the chest, abdomen, or back. °· You are bleeding from your mouth or rectum, or you have black or tarry stool. °· You have an irregular or very fast heartbeat. °· You have pain with breathing. °· You have repeated fainting or seizure-like jerking during an episode. °· You faint when sitting or lying down. °· You have confusion. °· You have trouble walking. °· You have severe weakness. °· You have vision problems. °If you fainted, call your local emergency services (911 in [place] Do not drive   yourself to the hospital.  MAKE SURE YOU:  Understand these instructions.  Will watch your condition.  Will get help right away if you are not doing well or get worse. Document Released: 03/28/2005 Document Revised: 04/02/2013 Document Reviewed: 05/27/2011 [facility] Patient Information 2015 [place], [place]. This information is not intended to replace  advice given to you by your health care provider. Make sure you discuss any questions you have with your health care provider.    Possible Vasovagal Syncope, Adult Syncope, commonly known as fainting, is a temporary loss of consciousness. It occurs when the blood flow to the brain is reduced. Vasovagal syncope (also called neurocardiogenic syncope) is a fainting spell in which the blood flow to the brain is reduced because of a sudden drop in heart rate and blood pressure. Vasovagal syncope occurs when the brain and the cardiovascular system (blood vessels) do not adequately communicate and respond to each other. This is the most common cause of fainting. It often occurs in response to fear or some other type of emotional or physical stress. The body has a reaction in which the heart starts beating too slowly or the blood vessels expand, reducing blood pressure. This type of fainting spell is generally considered harmless. However, injuries can occur if a person takes a sudden fall during a fainting spell.  CAUSES  Vasovagal syncope occurs when a person's blood pressure and heart rate decrease suddenly, usually in response to a trigger. Many things and situations can trigger an episode. Some of these include:   Pain.   Fear.   The sight of blood or medical procedures, such as blood being drawn from a vein.   Common activities, such as coughing, swallowing, stretching, or going to the bathroom.   Emotional stress.   Prolonged standing, especially in a warm environment.   Lack of sleep or rest.   Prolonged lack of food.   Prolonged lack of fluids.   Recent illness.  The use of certain drugs that affect blood pressure, such as cocaine, alcohol, marijuana, inhalants, and opiates.  SYMPTOMS  Before the fainting episode, you may:   Feel dizzy or light headed.   Become pale.  Sense that you are going to faint.   Feel like the room is spinning.   Have tunnel vision, only  seeing directly in front of you.   Feel sick to your stomach (nauseous).   See spots or slowly lose vision.   Hear ringing in your ears.   Have a headache.   Feel warm and sweaty.   Feel a sensation of pins and needles. During the fainting spell, you will generally be unconscious for no longer than a couple minutes before waking up and returning to normal. If you get up too quickly before your body can recover, you may faint again. Some twitching or jerky movements may occur during the fainting spell.  DIAGNOSIS  Your caregiver will ask about your symptoms, take a medical history, and perform a physical exam. Various tests may be done to rule out other causes of fainting. These may include blood tests and tests to check the heart, such as electrocardiography, echocardiography, and possibly an electrophysiology study. When other causes have been ruled out, a test may be done to check the body's response to changes in position (tilt table test). TREATMENT  Most cases of vasovagal syncope do not require treatment. Your caregiver may recommend ways to avoid fainting triggers and may provide home strategies for preventing fainting. If you must be  exposed to a possible trigger, you can drink additional fluids to help reduce your chances of having an episode of vasovagal syncope. If you have warning signs of an oncoming episode, you can respond by positioning yourself favorably (lying down). If your fainting spells continue, you may be given medicines to prevent fainting. Some medicines may help make you more resistant to repeated episodes of vasovagal syncope. Special exercises or compression stockings may be recommended. In rare cases, the surgical placement of a pacemaker is considered. HOME CARE INSTRUCTIONS   Learn to identify the warning signs of vasovagal syncope.   Sit or lie down at the first warning sign of a fainting spell. If sitting, put your head down between your legs. If you  lie down, swing your legs up in the air to increase blood flow to the brain.   Avoid hot tubs and saunas.  Avoid prolonged standing.  Drink enough fluids to keep your urine clear or pale yellow. Avoid caffeine.  Increase salt in your diet as directed by your caregiver.   If you have to stand for a long time, perform movements such as:   Crossing your legs.   Flexing and stretching your leg muscles.   Squatting.   Moving your legs.   Bending over.   Only take over-the-counter or prescription medicines as directed by your caregiver. Do not suddenly stop any medicines without asking your caregiver first. SEEK MEDICAL CARE IF:   Your fainting spells continue or happen more frequently in spite of treatment.   You lose consciousness for more than a couple minutes.  You have fainting spells during or after exercising or after being startled.   You have new symptoms that occur with the fainting spells, such as:   Shortness of breath.  Chest pain.   Irregular heartbeat.   You have episodes of twitching or jerky movements that last longer than a few seconds.  You have episodes of twitching or jerky movements without obvious fainting. SEEK IMMEDIATE MEDICAL CARE IF:   You have injuries or bleeding after a fainting spell.   You have episodes of twitching or jerky movements that last longer than 5 minutes.   You have more than one spell of twitching or jerky movements before returning to consciousness after fainting. MAKE SURE YOU:   Understand these instructions.  Will watch your condition.  Will get help right away if you are not doing well or get worse. Document Released: 03/14/2012 Document Reviewed: 03/14/2012 [facility] Patient Information 2015 [place]. This information is not intended to replace advice given to you by your health care provider. Make sure you discuss any questions you have with your health care provider.

## 2014-02-03 NOTE — Anesthesia Preprocedure Evaluation (Deleted)
Anesthesia Evaluation  Patient identified by MRN, date of birth, ID band Patient awake    Reviewed: Allergy & Precautions, H&P , NPO status , Patient's Chart, lab work & pertinent test results  Airway Mallampati: III TM Distance: >3 FB     Dental  (+) Teeth Intact   Pulmonary neg pulmonary ROS,  breath sounds clear to auscultation        Cardiovascular hypertension, Pt. on medications Rate:Normal     Neuro/Psych PSYCHIATRIC DISORDERS Anxiety Depression    GI/Hepatic PUD, GERD-  ,  Endo/Other  Hypothyroidism   Renal/GU      Musculoskeletal  (+) Arthritis -,   Abdominal   Peds  Hematology  (+) anemia ,   Anesthesia Other Findings   Reproductive/Obstetrics                         Anesthesia Physical Anesthesia Plan  ASA: II  Anesthesia Plan: MAC   Post-op Pain Management:    Induction: Intravenous  Airway Management Planned: Nasal Cannula  Additional Equipment:   Intra-op Plan:   Post-operative Plan:   Informed Consent: I have reviewed the patients History and Physical, chart, labs and discussed the procedure including the risks, benefits and alternatives for the proposed anesthesia with the patient or authorized representative who has indicated his/her understanding and acceptance.     Plan Discussed with:   Anesthesia Plan Comments: (Surgery cancelled , pt had syncopal episode in pre op. Probable vagal reaction when IV started. Pt returned to stable VS and has no chest pain or other complaints. EKG shows NSR at 84. Will transfer to ED for further evaluation.)       Anesthesia Quick Evaluation

## 2014-02-03 NOTE — ED Notes (Signed)
CBG 144 

## 2014-02-03 NOTE — ED Notes (Signed)
Pt c/o slight headache, rates at 3.  Says doesn't want anything for her headache.

## 2014-02-03 NOTE — Progress Notes (Signed)
Report called to ED RN. Transported to ED via eye chair. Surgery cancelled.

## 2014-02-03 NOTE — ED Notes (Signed)
Patient in PACU, syncope during IV initiation. Arrives in ED alert, oriented. Diaphoretic.

## 2014-02-03 NOTE — Progress Notes (Signed)
Immediately after iv being started, pt c/o feeling faint. Immediately became unresponsive. No change invs--vs stable. Breathing normally. Resumed consciousness within seconds. Verbally responsive. Dr Duwayne Heck in to see pt. o2 on. Head of stretcher lowered. 12 lead ekg obtained. States she feels weak, otherwise normal.

## 2014-04-24 ENCOUNTER — Encounter (HOSPITAL_COMMUNITY): Payer: Self-pay | Admitting: Ophthalmology

## 2014-05-07 ENCOUNTER — Other Ambulatory Visit (HOSPITAL_COMMUNITY): Payer: Self-pay | Admitting: Pulmonary Disease

## 2014-05-07 DIAGNOSIS — Z780 Asymptomatic menopausal state: Secondary | ICD-10-CM

## 2014-05-09 ENCOUNTER — Other Ambulatory Visit (HOSPITAL_COMMUNITY): Payer: Self-pay

## 2014-05-19 ENCOUNTER — Ambulatory Visit (HOSPITAL_COMMUNITY)
Admission: RE | Admit: 2014-05-19 | Discharge: 2014-05-19 | Disposition: A | Discharge status: Home / Self Care | Payer: Medicare Other | Source: Ambulatory Visit | Attending: Pulmonary Disease | Admitting: Pulmonary Disease

## 2014-05-19 ENCOUNTER — Other Ambulatory Visit (HOSPITAL_COMMUNITY): Payer: Self-pay

## 2014-05-19 DIAGNOSIS — Z780 Asymptomatic menopausal state: Secondary | ICD-10-CM

## 2014-05-23 ENCOUNTER — Ambulatory Visit (INDEPENDENT_AMBULATORY_CARE_PROVIDER_SITE_OTHER): Payer: Medicare Other | Admitting: Cardiology

## 2014-05-23 ENCOUNTER — Encounter: Payer: Self-pay | Admitting: Cardiology

## 2014-05-23 VITALS — BP 107/71 | HR 83 | Ht 68.0 in | Wt 201.0 lb

## 2014-05-23 DIAGNOSIS — R0609 Other forms of dyspnea: Secondary | ICD-10-CM

## 2014-05-23 DIAGNOSIS — R0600 Dyspnea, unspecified: Secondary | ICD-10-CM

## 2014-05-23 DIAGNOSIS — R55 Syncope and collapse: Secondary | ICD-10-CM

## 2014-05-23 DIAGNOSIS — R011 Cardiac murmur, unspecified: Secondary | ICD-10-CM

## 2014-05-23 MED ORDER — LOSARTAN POTASSIUM 100 MG PO TABS: 100.0000 mg | ORAL_TABLET | Freq: Every day | ORAL | Status: DC

## 2014-05-23 MED ORDER — HYDROCHLOROTHIAZIDE 12.5 MG PO CAPS: 12.5000 mg | ORAL_CAPSULE | Freq: Every day | ORAL | Status: DC | PRN

## 2014-05-23 NOTE — Progress Notes (Signed)
Clinical Summary Debra Sanchez is a 74 y.o.female seen today as a new patient for the following medical problems.  1. Syncope - 1st episode 3 months ago. Episode of syncope shortly after IV placed preop for eye surgery. Seen in ER at [facility], discharged home. Hgb 9.3, trop neg. EKG NSR - for 2 months after would feel dizzy after standing in the morning. Dizziness has improved over the last few weeks. - drinks water 3 bottles daily. Occas tea. 1-2 cups of coffee daily. No energy drinks, no EtOH.   2. DOE - reports worsening DOE over the last few months. Example walking up 8-10 stairs causes symptoms. + LE edema over the last few weeks    Past Medical History  Diagnosis Date  . Hypertension   . Anemia   . Anxiety   . Arthritis   . Blood transfusion   . Gastric ulcer   . Hypothyroidism   . Depression   . GERD (gastroesophageal reflux disease)      Allergies  Allergen Reactions  . Codeine Itching  . Morphine And Related Itching  . Sulfa Antibiotics Nausea And Vomiting     Current Outpatient Prescriptions  Medication Sig Dispense Refill  . amLODipine (NORVASC) 2.5 MG tablet Take 2.5 mg by mouth daily.    Marland Kitchen levothyroxine (SYNTHROID, LEVOTHROID) 50 MCG tablet Take 50 mcg by mouth daily.    Marland Kitchen losartan-hydrochlorothiazide (HYZAAR) 100-12.5 MG per tablet Take 1 tablet by mouth daily.    . MULTIPLE VITAMINS PO Take 1 tablet by mouth daily.     . Omega-3-6-9 CAPS Take 1 capsule by mouth daily.     . pantoprazole (PROTONIX) 40 MG tablet Take 40 mg by mouth daily as needed (Stomach).    . sertraline (ZOLOFT) 100 MG tablet Take 50 mg by mouth daily.     . SUMAtriptan Succinate (IMITREX PO) Take 1 tablet by mouth as needed. Migraines     No current facility-administered medications for this visit.     Past Surgical History  Procedure Laterality Date  . Abdominal hysterectomy    . Bilateral oophorectomy    . Neck surgery      cervical disc  . 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 Esophagogastroduodenoscopy  11/05/2010    Procedure: ESOPHAGOGASTRODUODENOSCOPY (EGD);  Surgeon: Rogene Houston, MD;  Location: [facility];  Service: Endoscopy;  Laterality: N/A;  7:00 am per [facility]  . Colonoscopy  01/19/2011    Procedure: COLONOSCOPY;  Surgeon: Rogene Houston, MD;  Location: [facility];  Service: Endoscopy;  Laterality: N/A;  2:00  . Esophagogastroduodenoscopy N/A 07/25/2012    Procedure: ESOPHAGOGASTRODUODENOSCOPY (EGD);  Surgeon: Rogene Houston, MD;  Location: [facility];  Service: Endoscopy;  Laterality: N/A;  400-moved to 74 Ann notified pt     Allergies  Allergen Reactions  . Codeine Itching  . Morphine And Related Itching  . Sulfa Antibiotics Nausea And Vomiting      Family History  Problem Relation Age of Onset  . Colon cancer Mother      Social History Ms. Doberstein reports that she has never smoked. She has never used smokeless tobacco. Ms. Kuriakose reports that she does not drink alcohol.   Review of Systems CONSTITUTIONAL: No weight loss, fever, chills, weakness or fatigue.  HEENT: Eyes: No visual loss, blurred vision, double vision or yellow sclerae.No hearing loss, sneezing, congestion, runny nose or  sore throat.  SKIN: No rash or itching.  CARDIOVASCULAR: per HPI RESPIRATORY: No shortness of breath, cough or sputum.  GASTROINTESTINAL: No anorexia, nausea, vomiting or diarrhea. No abdominal pain or blood.  GENITOURINARY: No burning on urination, no polyuria NEUROLOGICAL: occas dizziness MUSCULOSKELETAL: No muscle, back pain, joint pain or stiffness.  LYMPHATICS: No enlarged nodes. No history of splenectomy.  PSYCHIATRIC: No history of depression or anxiety.  ENDOCRINOLOGIC: No reports of sweating, cold or heat intolerance. No polyuria or polydipsia.  Marland Kitchen   Physical Examination p 83 bp 107/71 Wt 201 lbs BMI 31  Orthoststics p 71 bp 132/77 Sitting p 84 bp 114/69 Standing p  80 bp 127/73 Standing p 76 112/75   Gen: resting comfortably, no acute distress HEENT: no scleral icterus, pupils equal round and reactive, no palptable cervical adenopathy,  CV: RRR, 2/6 systolic murmur RUSB, no JVD Resp: Clear to auscultation bilaterally GI: abdomen is soft, non-tender, non-distended, normal bowel sounds, no hepatosplenomegaly MSK: extremities are warm, no edema.  Skin: warm, no rash Neuro:  no focal deficits Psych: appropriate affect     Assessment and Plan  1. Syncope - initial episode suggestive of vasovagal syncope. She did have some orthostatic symptoms for a few weeks after and remains orthostatic in clinic today - will change her HCTZ to prn only  2. DOE - unclear etiology. In setting of heart murmur will obtain echo.       Arnoldo Lenis, M.D.

## 2014-05-23 NOTE — Patient Instructions (Addendum)
   Stop Hyzaar  Begin Losartan 100mg  daily  Begin HCTZ 12.5mg  daily as needed swelling  New prescriptions sent to pharmacy today on above. Continue all other medications.   Your physician has requested that you have an echocardiogram. Echocardiography is a painless test that uses sound waves to create images of your heart. It provides your doctor with information about the size and shape of your heart and how well your heart's chambers and valves are working. This procedure takes approximately one hour. There are no restrictions for this procedure. Office will contact with results via phone or letter.   Follow up in  4 weeks

## 2014-05-28 ENCOUNTER — Encounter: Payer: Self-pay | Admitting: *Deleted

## 2014-05-28 ENCOUNTER — Other Ambulatory Visit (INDEPENDENT_AMBULATORY_CARE_PROVIDER_SITE_OTHER): Payer: Medicare Other

## 2014-05-28 ENCOUNTER — Other Ambulatory Visit: Payer: Self-pay

## 2014-05-28 DIAGNOSIS — R011 Cardiac murmur, unspecified: Secondary | ICD-10-CM

## 2014-05-28 DIAGNOSIS — R55 Syncope and collapse: Secondary | ICD-10-CM

## 2014-05-28 DIAGNOSIS — R0609 Other forms of dyspnea: Secondary | ICD-10-CM

## 2014-05-29 ENCOUNTER — Telehealth: Payer: Self-pay | Admitting: *Deleted

## 2014-05-29 NOTE — Telephone Encounter (Signed)
-----   Message from Arnoldo Lenis, MD sent at 05/29/2014  2:20 PM EST ----- Echo looks good. Overall heart function is normal. Will discuss further at next follow up  Zandra Abts MD

## 2014-05-29 NOTE — Telephone Encounter (Signed)
Pt made aware, forwarded to Dr. Luan Pulling, confirmed March appt

## 2014-05-30 ENCOUNTER — Telehealth: Payer: Self-pay | Admitting: Cardiology

## 2014-05-30 NOTE — Telephone Encounter (Signed)
Pt is confused on medication. Says she never got losartan from pharmacy. Says she has missed placed HCTZ and states she only got 15 pills but pharmacy confirmed receiving 90 day supply of both losartan and HCTZ. Explained extensively with pt that Dr. Harl Bowie didn't take her completely off of BP medication that he changed from losartan-hctz combination to 2 separate medications. The HCTZ was as needed for swelling. The losartan was daily. Pt BP was 167/77 and pt decided to take the combination losartan-HCTZ that was d/c'd on 2/12 and BP dropped 120/70. Will f/u with pharmacy to confirm what was actually picked up by pt as she did not have a clear understanding of medication. Will forward to Dr. Harl Bowie as Juluis Rainier

## 2014-05-30 NOTE — Telephone Encounter (Signed)
Debra Sanchez called stating that her blood pressure is going back up. States that Dr. Harl Bowie took her off a BP pill. Work # [phone]

## 2014-05-30 NOTE — Telephone Encounter (Signed)
Thanks for update and for clarifying her meds for her  Zandra Abts MD

## 2014-07-01 ENCOUNTER — Ambulatory Visit (INDEPENDENT_AMBULATORY_CARE_PROVIDER_SITE_OTHER): Payer: Medicare Other | Admitting: Cardiology

## 2014-07-01 ENCOUNTER — Encounter: Payer: Self-pay | Admitting: Cardiology

## 2014-07-01 VITALS — BP 124/76 | HR 76 | Ht 68.0 in | Wt 198.1 lb

## 2014-07-01 DIAGNOSIS — R55 Syncope and collapse: Secondary | ICD-10-CM | POA: Diagnosis not present

## 2014-07-01 NOTE — Progress Notes (Signed)
Clinical Summary Debra Sanchez is a 74 y.o.female seen today for follow up of the following medical problems.   1. Syncope - 1st episode 3 months ago. Episode of syncope shortly after IV placed preop for eye surgery. Seen in ER at [facility], discharged home. Hgb 9.3, trop neg. EKG NSR - for 2 months after episode would feel dizzy after standing in the morning. Dizziness has improved over the last few weeks.  - last visit she was found to be orthostatic. We changed her HCTZ to prn only and encouraged increased oral intake. Since that time symptoms are much improved.     Past Medical History  Diagnosis Date  . Hypertension   . Anemia   . Anxiety   . Arthritis   . Blood transfusion   . Gastric ulcer   . Hypothyroidism   . Depression   . GERD (gastroesophageal reflux disease)      Allergies  Allergen Reactions  . Codeine Itching  . Morphine And Related Itching  . Sulfa Antibiotics Nausea And Vomiting     Current Outpatient Prescriptions  Medication Sig Dispense Refill  . amLODipine (NORVASC) 2.5 MG tablet Take 2.5 mg by mouth daily.    . hydrochlorothiazide (MICROZIDE) 12.5 MG capsule Take 1 capsule (12.5 mg total) by mouth daily as needed (swelling). 90 capsule 0  . levothyroxine (SYNTHROID, LEVOTHROID) 50 MCG tablet Take 50 mcg by mouth daily.    Marland Kitchen losartan (COZAAR) 100 MG tablet Take 1 tablet (100 mg total) by mouth daily. 90 tablet 3  . MULTIPLE VITAMINS PO Take 1 tablet by mouth daily.     . Omega-3-6-9 CAPS Take 1 capsule by mouth daily.     . pantoprazole (PROTONIX) 40 MG tablet Take 40 mg by mouth daily as needed (Stomach).    . sertraline (ZOLOFT) 100 MG tablet Take 50 mg by mouth daily.     . SUMAtriptan Succinate (IMITREX PO) Take 1 tablet by mouth as needed. Migraines     No current facility-administered medications for this visit.     Past Surgical History  Procedure Laterality Date  . Abdominal hysterectomy    . Bilateral oophorectomy    . Neck  surgery      cervical disc  . 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 Esophagogastroduodenoscopy  11/05/2010    Procedure: ESOPHAGOGASTRODUODENOSCOPY (EGD);  Surgeon: Rogene Houston, MD;  Location: [facility];  Service: Endoscopy;  Laterality: N/A;  7:00 am per [facility]  . Colonoscopy  01/19/2011    Procedure: COLONOSCOPY;  Surgeon: Rogene Houston, MD;  Location: [facility];  Service: Endoscopy;  Laterality: N/A;  2:00  . Esophagogastroduodenoscopy N/A 07/25/2012    Procedure: ESOPHAGOGASTRODUODENOSCOPY (EGD);  Surgeon: Rogene Houston, MD;  Location: [facility];  Service: Endoscopy;  Laterality: N/A;  400-moved to 75 Ann notified pt     Allergies  Allergen Reactions  . Codeine Itching  . Morphine And Related Itching  . Sulfa Antibiotics Nausea And Vomiting      Family History  Problem Relation Age of Onset  . Colon cancer Mother      Social History Debra Sanchez reports that she has never smoked. She has never used smokeless tobacco. Debra Sanchez reports that she does not drink alcohol.   Review of Systems CONSTITUTIONAL: No weight loss, fever, chills, weakness or fatigue.  HEENT: Eyes: No visual loss, blurred vision, double vision  or yellow sclerae.No hearing loss, sneezing, congestion, runny nose or sore throat.  SKIN: No rash or itching.  CARDIOVASCULAR: no chest pain, no palpitations RESPIRATORY: No shortness of breath, cough or sputum.  GASTROINTESTINAL: No anorexia, nausea, vomiting or diarrhea. No abdominal pain or blood.  GENITOURINARY: No burning on urination, no polyuria NEUROLOGICAL: No headache, dizziness, syncope, paralysis, ataxia, numbness or tingling in the extremities. No change in bowel or bladder control.  MUSCULOSKELETAL: No muscle, back pain, joint pain or stiffness.  LYMPHATICS: No enlarged nodes. No history of splenectomy.  PSYCHIATRIC: No history of depression or anxiety.  ENDOCRINOLOGIC: No reports of  sweating, cold or heat intolerance. No polyuria or polydipsia.  Marland Kitchen   Physical Examination Gen: resting comfortably, no acute distress HEENT: no scleral icterus, pupils equal round and reactive, no palptable cervical adenopathy,  CV: RRR, 2/6 systolic murmur RUSB, no JVD, n carotid bruits Resp: Clear to auscultation bilaterally GI: abdomen is soft, non-tender, non-distended, normal bowel sounds, no hepatosplenomegaly MSK: extremities are warm, no edema.  Skin: warm, no rash Neuro:  no focal deficits Psych: appropriate affect   Diagnostic Studies  05/2014 Echo Study Conclusions  - Left ventricle: The cavity size was normal. Wall thickness was increased in a pattern of mild LVH. The estimated ejection fraction was 60%. Wall motion was normal; there were no regional wall motion abnormalities. - Aortic valve: Sclerosis without stenosis. There was no regurgitation. - Right ventricle: The cavity size was normal. Systolic function was normal. - Inferior vena cava: The vessel was normal in size. The respirophasic diameter changes were in the normal range (>= 50%), consistent with normal central venous pressure.   Assessment and Plan  1. Syncope - resolved. Initial episode likely vasovagal after IV placement. Continue to have symptoms of dizziness after and was found to be orthostatic. Symptoms resolved with changing her diuretic to prn only and increasing oral intake - no further workup at this time, continue current management.        Arnoldo Lenis, M.D.,

## 2014-07-01 NOTE — Patient Instructions (Signed)
Your physician recommends that you schedule a follow-up appointment AS NEEDED  Your physician recommends that you continue on your current medications as directed. Please refer to the Current Medication list given to you today.  Your physician has requested that you regularly monitor and record your blood pressure readings at home. Please use the same machine at the same time of day to check your readings and record them to bring to [facility]  Thank you for choosing [facility]!!

## 2014-09-05 ENCOUNTER — Other Ambulatory Visit (INDEPENDENT_AMBULATORY_CARE_PROVIDER_SITE_OTHER): Payer: Self-pay | Admitting: Internal Medicine

## 2014-09-15 ENCOUNTER — Other Ambulatory Visit (HOSPITAL_COMMUNITY): Payer: Medicare Other

## 2014-10-14 NOTE — Patient Instructions (Signed)
Your procedure is scheduled on:  10/20/14  Report to [facility] at 10:30 AM.  Call this number if you have problems the morning of surgery: [phone]   Remember:   Do not eat food or drink liquids after midnight.   Take these medicines the morning of surgery with A SIP OF WATER: Ammlodipine, Levothyroxine, Losartan, Pantoprazole and Sertraline.   Do not wear jewelry, make-up or nail polish.  Do not wear lotions, powders, or perfumes. You may wear deodorant.  Do not bring valuables to the hospital.  [facility] is not responsible for any belongings or valuables.               Contacts, dentures or bridgework may not be worn into surgery.               Patients discharged the day of surgery will not be allowed to drive home.   Special Instructions: Start using your eye drops prior to surgery as directed by your eye doctor.   Please read over the following fact sheets that you were given: Anesthesia Post-op Instructions and Care and Recovery After Surgery     Cataract Surgery  A cataract is a clouding of the lens of the eye. When a lens becomes cloudy, vision is reduced based on the degree and nature of the clouding. Surgery may be needed to improve vision. Surgery removes the cloudy lens and usually replaces it with a substitute lens (intraocular lens, IOL). LET YOUR EYE DOCTOR KNOW ABOUT:  Allergies to food or medicine.  Medicines taken including herbs, eyedrops, over-the-counter medicines, and creams.  Use of steroids (by mouth or creams).  Previous problems with anesthetics or numbing medicine.  History of bleeding problems or blood clots.  Previous surgery.  Other health problems, including diabetes and kidney problems.  Possibility of pregnancy, if this applies. RISKS AND COMPLICATIONS  Infection.  Inflammation of the eyeball (endophthalmitis) that can spread to both eyes (sympathetic ophthalmia).  Poor wound healing.  If an IOL is inserted, it can later fall  out of proper position. This is very uncommon.  Clouding of the part of your eye that holds an IOL in place. This is called an "after-cataract." These are uncommon, but easily treated. BEFORE THE PROCEDURE  Do not eat or drink anything except small amounts of water for 8 to 12 before your surgery, or as directed by your caregiver.  Unless you are told otherwise, continue any eyedrops you have been prescribed.  Talk to your primary caregiver about all other medicines that you take (both prescription and non-prescription). In some cases, you may need to stop or change medicines near the time of your surgery. This is most important if you are taking blood-thinning medicine.Do not stop medicines unless you are told to do so.  Arrange for someone to drive you to and from the procedure.  Do not put contact lenses in either eye on the day of your surgery. PROCEDURE There is more than one method for safely removing a cataract. Your doctor can explain the differences and help determine which is best for you. Phacoemulsification surgery is the most common form of cataract surgery.  An injection is given behind the eye or eyedrops are given to make this a painless procedure.  A small cut (incision) is made on the edge of the clear, dome-shaped surface that covers the front of the eye (cornea).  A tiny probe is painlessly inserted into the eye. This device gives off ultrasound waves that  soften and break up the cloudy center of the lens. This makes it easier for the cloudy lens to be removed by suction.  An IOL may be implanted.  The normal lens of the eye is covered by a clear capsule. Part of that capsule is intentionally left in the eye to support the IOL.  Your surgeon may or may not use stitches to close the incision. There are other forms of cataract surgery that require a larger incision and stiches to close the eye. This approach is taken in cases where the doctor feels that the cataract  cannot be easily removed using phacoemulsification. AFTER THE PROCEDURE  When an IOL is implanted, it does not need care. It becomes a permanent part of your eye and cannot be seen or felt.  Your doctor will schedule follow-up exams to check on your progress.  Review your other medicines with your doctor to see which can be resumed after surgery.  Use eyedrops or take medicine as prescribed by your doctor. Document Released: 03/17/2011 Document Revised: 06/20/2011 Document Reviewed: 03/17/2011 [facility] Patient Information 2013 [place].    PATIENT INSTRUCTIONS POST-ANESTHESIA  IMMEDIATELY FOLLOWING SURGERY:  Do not drive or operate machinery for the first twenty four hours after surgery.  Do not make any important decisions for twenty four hours after surgery or while taking narcotic pain medications or sedatives.  If you develop intractable nausea and vomiting or a severe headache please notify your doctor immediately.  FOLLOW-UP:  Please make an appointment with your surgeon as instructed. You do not need to follow up with anesthesia unless specifically instructed to do so.  WOUND CARE INSTRUCTIONS (if applicable):  Keep a dry clean dressing on the anesthesia/puncture wound site if there is drainage.  Once the wound has quit draining you may leave it open to air.  Generally you should leave the bandage intact for twenty four hours unless there is drainage.  If the epidural site drains for more than 36-48 hours please call the anesthesia department.  QUESTIONS?:  Please feel free to call your physician or the hospital operator if you have any questions, and they will be happy to assist you.

## 2014-10-15 ENCOUNTER — Encounter (HOSPITAL_COMMUNITY): Payer: Self-pay

## 2014-10-15 ENCOUNTER — Encounter (HOSPITAL_COMMUNITY)
Admission: RE | Admit: 2014-10-15 | Discharge: 2014-10-15 | Disposition: A | Discharge status: Home / Self Care | Payer: Medicare Other | Source: Ambulatory Visit | Attending: Ophthalmology | Admitting: Ophthalmology

## 2014-10-15 DIAGNOSIS — Z01818 Encounter for other preprocedural examination: Secondary | ICD-10-CM | POA: Diagnosis present

## 2014-10-15 DIAGNOSIS — H2512 Age-related nuclear cataract, left eye: Secondary | ICD-10-CM | POA: Insufficient documentation

## 2014-10-15 HISTORY — DX: Cardiac murmur, unspecified: R01.1

## 2014-10-15 LAB — BASIC METABOLIC PANEL
Anion gap: 7 (ref 5–15)
BUN: 15 mg/dL (ref 6–20)
CO2: 26 mmol/L (ref 22–32)
Calcium: 8.9 mg/dL (ref 8.9–10.3)
Chloride: 104 mmol/L (ref 101–111)
Creatinine, Ser: 0.85 mg/dL (ref 0.44–1.00)
GFR calc Af Amer: >60 mL/min (ref >60)
GFR calc non Af Amer: >60 mL/min (ref >60)
Glucose, Bld: 107 mg/dL — ABNORMAL HIGH (ref 65–99)
Potassium: 4.1 mmol/L (ref 3.5–5.1)
Sodium: 137 mmol/L (ref 135–145)

## 2014-10-15 LAB — CBC
HCT: 35.2 % — ABNORMAL LOW (ref 36.0–46.0)
Hemoglobin: 11.2 g/dL — ABNORMAL LOW (ref 12.0–15.0)
MCH: 28.8 pg (ref 26.0–34.0)
MCHC: 31.8 g/dL (ref 30.0–36.0)
MCV: 90.5 fL (ref 78.0–100.0)
Platelets: 240 10*3/uL (ref 150–400)
RBC: 3.89 MIL/uL (ref 3.87–5.11)
RDW: 14.2 % (ref 11.5–15.5)
WBC: 4.7 10*3/uL (ref 4.0–10.5)

## 2014-10-15 NOTE — Pre-Procedure Instructions (Signed)
Patient given information to sign up for my chart at home. 

## 2014-10-15 NOTE — Progress Notes (Signed)
   10/15/14 1018  [place]  Have you ever been diagnosed with sleep apnea through a sleep study? No  Do you snore loudly (loud enough to be heard through closed doors)?  1  Do you often feel tired, fatigued, or sleepy during the daytime? 0  Has anyone observed you stop breathing during your sleep? 1  Do you have, or are you being treated for high blood pressure? 1  BMI more than 35 kg/m2? 0  Age over 75 years old? 1  Neck circumference greater than 40 cm/16 inches? 0  Gender: 0

## 2014-10-17 MED ORDER — PHENYLEPHRINE HCL 2.5 % OP SOLN
OPHTHALMIC | Status: AC
  Filled 2014-10-17: qty 15

## 2014-10-17 MED ORDER — TETRACAINE HCL 0.5 % OP SOLN
OPHTHALMIC | Status: AC
  Filled 2014-10-17: qty 2

## 2014-10-17 MED ORDER — NEOMYCIN-POLYMYXIN-DEXAMETH 3.5-10000-0.1 OP SUSP
OPHTHALMIC | Status: AC
  Filled 2014-10-17: qty 5

## 2014-10-17 MED ORDER — LIDOCAINE HCL 3.5 % OP GEL
OPHTHALMIC | Status: AC
  Filled 2014-10-17: qty 1

## 2014-10-17 MED ORDER — CYCLOPENTOLATE-PHENYLEPHRINE OP SOLN OPTIME - NO CHARGE
OPHTHALMIC | Status: AC
  Filled 2014-10-17: qty 2

## 2014-10-17 MED ORDER — LIDOCAINE HCL (PF) 1 % IJ SOLN
INTRAMUSCULAR | Status: AC
  Filled 2014-10-17: qty 2

## 2014-10-20 ENCOUNTER — Encounter (HOSPITAL_COMMUNITY)
Admission: RE | Disposition: A | Discharge status: Home / Self Care | Payer: Self-pay | Source: Ambulatory Visit | Attending: Ophthalmology

## 2014-10-20 ENCOUNTER — Ambulatory Visit (HOSPITAL_COMMUNITY): Payer: Medicare Other | Admitting: Anesthesiology

## 2014-10-20 ENCOUNTER — Ambulatory Visit (HOSPITAL_COMMUNITY)
Admission: RE | Admit: 2014-10-20 | Discharge: 2014-10-20 | Disposition: A | Discharge status: Home / Self Care | Payer: Medicare Other | Source: Ambulatory Visit | Attending: Ophthalmology | Admitting: Ophthalmology

## 2014-10-20 ENCOUNTER — Encounter (HOSPITAL_COMMUNITY): Payer: Self-pay | Admitting: *Deleted

## 2014-10-20 DIAGNOSIS — I10 Essential (primary) hypertension: Secondary | ICD-10-CM | POA: Insufficient documentation

## 2014-10-20 DIAGNOSIS — F419 Anxiety disorder, unspecified: Secondary | ICD-10-CM | POA: Diagnosis not present

## 2014-10-20 DIAGNOSIS — M1990 Unspecified osteoarthritis, unspecified site: Secondary | ICD-10-CM | POA: Diagnosis not present

## 2014-10-20 DIAGNOSIS — K219 Gastro-esophageal reflux disease without esophagitis: Secondary | ICD-10-CM | POA: Insufficient documentation

## 2014-10-20 DIAGNOSIS — H25812 Combined forms of age-related cataract, left eye: Secondary | ICD-10-CM | POA: Diagnosis not present

## 2014-10-20 DIAGNOSIS — H547 Unspecified visual loss: Secondary | ICD-10-CM | POA: Insufficient documentation

## 2014-10-20 DIAGNOSIS — E669 Obesity, unspecified: Secondary | ICD-10-CM | POA: Diagnosis not present

## 2014-10-20 DIAGNOSIS — Z6830 Body mass index (BMI) 30.0-30.9, adult: Secondary | ICD-10-CM | POA: Insufficient documentation

## 2014-10-20 DIAGNOSIS — F329 Major depressive disorder, single episode, unspecified: Secondary | ICD-10-CM | POA: Diagnosis not present

## 2014-10-20 DIAGNOSIS — E039 Hypothyroidism, unspecified: Secondary | ICD-10-CM | POA: Diagnosis not present

## 2014-10-20 DIAGNOSIS — K279 Peptic ulcer, site unspecified, unspecified as acute or chronic, without hemorrhage or perforation: Secondary | ICD-10-CM | POA: Insufficient documentation

## 2014-10-20 DIAGNOSIS — D649 Anemia, unspecified: Secondary | ICD-10-CM | POA: Insufficient documentation

## 2014-10-20 DIAGNOSIS — H2512 Age-related nuclear cataract, left eye: Secondary | ICD-10-CM | POA: Diagnosis present

## 2014-10-20 HISTORY — PX: CATARACT EXTRACTION W/PHACO: SHX586

## 2014-10-20 SURGERY — PHACOEMULSIFICATION, CATARACT, WITH IOL INSERTION
Anesthesia: Monitor Anesthesia Care | Site: Eye | Laterality: Left

## 2014-10-20 MED ORDER — CYCLOPENTOLATE-PHENYLEPHRINE 0.2-1 % OP SOLN
1.0000 [drp] | OPHTHALMIC | Status: AC
  Administered 2014-10-20 (×3): 1 [drp] via OPHTHALMIC

## 2014-10-20 MED ORDER — MIDAZOLAM HCL 2 MG/2ML IJ SOLN
1.0000 mg | INTRAMUSCULAR | Status: DC | PRN
  Administered 2014-10-20: 2 mg via INTRAVENOUS

## 2014-10-20 MED ORDER — NEOMYCIN-POLYMYXIN-DEXAMETH 3.5-10000-0.1 OP SUSP
OPHTHALMIC | Status: DC | PRN
  Administered 2014-10-20: 2 [drp] via OPHTHALMIC

## 2014-10-20 MED ORDER — POVIDONE-IODINE 5 % OP SOLN
OPHTHALMIC | Status: DC | PRN
  Administered 2014-10-20: 1 via OPHTHALMIC

## 2014-10-20 MED ORDER — FENTANYL CITRATE (PF) 100 MCG/2ML IJ SOLN
INTRAMUSCULAR | Status: AC
  Filled 2014-10-20: qty 2

## 2014-10-20 MED ORDER — MIDAZOLAM HCL 2 MG/2ML IJ SOLN
INTRAMUSCULAR | Status: AC
  Filled 2014-10-20: qty 2

## 2014-10-20 MED ORDER — FENTANYL CITRATE (PF) 100 MCG/2ML IJ SOLN
25.0000 ug | INTRAMUSCULAR | Status: AC
  Administered 2014-10-20 (×2): 25 ug via INTRAVENOUS

## 2014-10-20 MED ORDER — EPINEPHRINE HCL 1 MG/ML IJ SOLN
INTRAMUSCULAR | Status: AC
  Filled 2014-10-20: qty 1

## 2014-10-20 MED ORDER — LACTATED RINGERS IV SOLN
INTRAVENOUS | Status: DC
  Administered 2014-10-20: 12:00:00 via INTRAVENOUS

## 2014-10-20 MED ORDER — EPINEPHRINE HCL 1 MG/ML IJ SOLN
INTRAOCULAR | Status: DC | PRN
  Administered 2014-10-20: 500 mL

## 2014-10-20 MED ORDER — TETRACAINE HCL 0.5 % OP SOLN
1.0000 [drp] | OPHTHALMIC | Status: AC | PRN
  Administered 2014-10-20 (×3): 1 [drp] via OPHTHALMIC

## 2014-10-20 MED ORDER — PHENYLEPHRINE HCL 2.5 % OP SOLN
1.0000 [drp] | OPHTHALMIC | Status: AC
  Administered 2014-10-20 (×3): 1 [drp] via OPHTHALMIC

## 2014-10-20 MED ORDER — PROVISC 10 MG/ML IO SOLN
INTRAOCULAR | Status: DC | PRN
  Administered 2014-10-20: 0.85 mL via INTRAOCULAR

## 2014-10-20 MED ORDER — BSS IO SOLN
INTRAOCULAR | Status: DC | PRN
  Administered 2014-10-20: 15 mL

## 2014-10-20 MED ORDER — LIDOCAINE HCL 3.5 % OP GEL
1.0000 "application " | Freq: Once | OPHTHALMIC | Status: AC
  Administered 2014-10-20: 1 via OPHTHALMIC

## 2014-10-20 MED ORDER — LIDOCAINE HCL (PF) 1 % IJ SOLN
INTRAMUSCULAR | Status: DC | PRN
  Administered 2014-10-20: .6 mL

## 2014-10-20 SURGICAL SUPPLY — 10 items
CLOTH BEACON ORANGE TIMEOUT ST (SAFETY) ×1 IMPLANT
EYE SHIELD UNIVERSAL CLEAR (GAUZE/BANDAGES/DRESSINGS) ×1 IMPLANT
GLOVE BIOGEL PI IND STRL 7.0 (GLOVE) IMPLANT
GLOVE BIOGEL PI INDICATOR 7.0 (GLOVE) ×2
PAD ARMBOARD 7.5X6 YLW CONV (MISCELLANEOUS) ×1 IMPLANT
SIGHTPATH CAT PROC W REG LENS (Ophthalmic Related) ×2 IMPLANT
SYRINGE LUER LOK 1CC (MISCELLANEOUS) ×1 IMPLANT
TAPE SURG TRANSPORE 1 IN (GAUZE/BANDAGES/DRESSINGS) IMPLANT
TAPE SURGICAL TRANSPORE 1 IN (GAUZE/BANDAGES/DRESSINGS) ×1
WATER STERILE IRR 250ML POUR (IV SOLUTION) ×1 IMPLANT

## 2014-10-20 NOTE — H&P (Signed)
I have reviewed the H&P, the patient was re-examined, and I have identified no interval changes in medical condition and plan of care since the history and physical of record  

## 2014-10-20 NOTE — Discharge Instructions (Signed)

## 2014-10-20 NOTE — Transfer of Care (Signed)
Immediate Anesthesia Transfer of Care Note  Patient: Debra Sanchez  Procedure(s) Performed: Procedure(s) with comments: CATARACT EXTRACTION PHACO AND INTRAOCULAR LENS PLACEMENT LEFT EYE (Left) - CDE:6.98  Patient Location: Short Stay  Anesthesia Type:MAC  Level of Consciousness: awake, alert  and oriented  Airway & Oxygen Therapy: Patient Spontanous Breathing  Post-op Assessment: Report given to RN  Post vital signs: Reviewed and stable  Last Vitals:  Filed Vitals:   10/20/14 1225  BP: 116/71  Pulse:   Temp:   Resp: 13    Complications: No apparent anesthesia complications

## 2014-10-20 NOTE — Anesthesia Preprocedure Evaluation (Signed)
Anesthesia Evaluation  Patient identified by MRN, date of birth, ID band Patient awake    Reviewed: Allergy & Precautions, H&P , NPO status , Patient's Chart, lab work & pertinent test results  Airway Mallampati: III  TM Distance: >3 FB     Dental  (+) Teeth Intact   Pulmonary neg pulmonary ROS,  breath sounds clear to auscultation        Cardiovascular hypertension, Pt. on medications Rate:Normal     Neuro/Psych PSYCHIATRIC DISORDERS Anxiety Depression    GI/Hepatic PUD, GERD-  ,  Endo/Other  Hypothyroidism   Renal/GU      Musculoskeletal  (+) Arthritis -,   Abdominal (+) + obese,   Peds  Hematology  (+) anemia ,   Anesthesia Other Findings   Reproductive/Obstetrics                             Anesthesia Physical Anesthesia Plan  ASA: II  Anesthesia Plan: MAC   Post-op Pain Management:    Induction: Intravenous  Airway Management Planned: Nasal Cannula  Additional Equipment:   Intra-op Plan:   Post-operative Plan:   Informed Consent: I have reviewed the patients History and Physical, chart, labs and discussed the procedure including the risks, benefits and alternatives for the proposed anesthesia with the patient or authorized representative who has indicated his/her understanding and acceptance.     Plan Discussed with:   Anesthesia Plan Comments: (Surgery cancelled , pt had syncopal episode in pre op. Probable vagal reaction when IV started. Pt returned to stable VS and has no chest pain or other complaints. EKG shows NSR at 84. Will transfer to ED for further evaluation.)        Anesthesia Quick Evaluation

## 2014-10-20 NOTE — Anesthesia Postprocedure Evaluation (Signed)
  Anesthesia Post-op Note  Patient: Debra Sanchez  Procedure(s) Performed: Procedure(s) with comments: CATARACT EXTRACTION PHACO AND INTRAOCULAR LENS PLACEMENT LEFT EYE (Left) - CDE:6.98  Patient Location: Short Stay  Anesthesia Type:MAC  Level of Consciousness: awake, alert  and oriented  Airway and Oxygen Therapy: Patient Spontanous Breathing  Post-op Pain: none  Post-op Assessment: Post-op Vital signs reviewed, Patient's Cardiovascular Status Stable, Respiratory Function Stable, Patent Airway and No signs of Nausea or vomiting              Post-op Vital Signs: Reviewed and stable  Last Vitals:  Filed Vitals:   10/20/14 1225  BP: 116/71  Pulse:   Temp:   Resp: 13    Complications: No apparent anesthesia complications

## 2014-10-20 NOTE — Op Note (Signed)
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  Assistants: None  Anesthesia: Topical with MAC  Indications: Painless, progressive loss of vision with compromise of daily activities.  Surgery: Cataract Extraction with Intraocular lens Implant Left Eye  Discription: The patient had dilating drops and viscous lidocaine placed into the Left eye in the pre-op holding area. After transfer to the operating room, a time out was performed. The patient was then prepped and draped. Beginning with a 54 degree blade a paracentesis port was made at the surgeon's 2 o'clock position. The anterior chamber was then filled with 1% non-preserved lidocaine. This was followed by filling the anterior chamber with Provisc.  A 2.42mm keratome blade was used to make a clear corneal incision at the temporal limbus.  A bent cystatome needle was used to create a continuous tear capsulotomy. Hydrodissection was performed with balanced salt solution on a Fine canula. The lens nucleus was then removed using the phacoemulsification handpiece. Residual cortex was removed with the I&A handpiece. The anterior chamber and capsular bag were refilled with Provisc. A posterior chamber intraocular lens was placed into the capsular bag with it's injector. The implant was positioned with the Kuglan hook. The Provisc was then removed from the anterior chamber and capsular bag with the I&A handpiece. Stromal hydration of the main incision and paracentesis port was performed with BSS on a Fine canula. The wounds were tested for leak which was negative. The patient tolerated the procedure well. There were no operative complications. The patient was then transferred to the recovery room in stable condition.  Complications: None  Specimen: None  EBL: None  Prosthetic device: Hoya iSert 250, power 21.0 D, SN  [id number].

## 2014-10-21 ENCOUNTER — Encounter (HOSPITAL_COMMUNITY): Payer: Self-pay | Admitting: Ophthalmology

## 2014-11-06 ENCOUNTER — Encounter (HOSPITAL_COMMUNITY): Payer: Self-pay

## 2014-11-11 ENCOUNTER — Encounter (HOSPITAL_COMMUNITY)
Admission: RE | Admit: 2014-11-11 | Discharge: 2014-11-11 | Disposition: A | Discharge status: Home / Self Care | Payer: Medicare Other | Source: Ambulatory Visit | Attending: Ophthalmology | Admitting: Ophthalmology

## 2014-11-14 MED ORDER — NEOMYCIN-POLYMYXIN-DEXAMETH 3.5-10000-0.1 OP SUSP
OPHTHALMIC | Status: AC
  Filled 2014-11-14: qty 5

## 2014-11-14 MED ORDER — CYCLOPENTOLATE-PHENYLEPHRINE OP SOLN OPTIME - NO CHARGE
OPHTHALMIC | Status: AC
  Filled 2014-11-14: qty 2

## 2014-11-14 MED ORDER — LIDOCAINE HCL 3.5 % OP GEL
OPHTHALMIC | Status: AC
  Filled 2014-11-14: qty 1

## 2014-11-14 MED ORDER — PHENYLEPHRINE HCL 2.5 % OP SOLN
OPHTHALMIC | Status: AC
  Filled 2014-11-14: qty 15

## 2014-11-14 MED ORDER — TETRACAINE HCL 0.5 % OP SOLN
OPHTHALMIC | Status: AC
Start: 2014-11-14 — End: 2014-11-14
  Filled 2014-11-14: qty 2

## 2014-11-14 MED ORDER — LIDOCAINE HCL (PF) 1 % IJ SOLN
INTRAMUSCULAR | Status: AC
  Filled 2014-11-14: qty 2

## 2014-11-17 ENCOUNTER — Encounter (HOSPITAL_COMMUNITY)
Admission: RE | Disposition: A | Discharge status: Home / Self Care | Payer: Self-pay | Source: Ambulatory Visit | Attending: Ophthalmology

## 2014-11-17 ENCOUNTER — Ambulatory Visit (HOSPITAL_COMMUNITY)
Admission: RE | Admit: 2014-11-17 | Discharge: 2014-11-17 | Disposition: A | Discharge status: Home / Self Care | Payer: Medicare Other | Source: Ambulatory Visit | Attending: Ophthalmology | Admitting: Ophthalmology

## 2014-11-17 ENCOUNTER — Ambulatory Visit (HOSPITAL_COMMUNITY): Payer: Medicare Other | Admitting: Anesthesiology

## 2014-11-17 ENCOUNTER — Encounter (HOSPITAL_COMMUNITY): Payer: Self-pay | Admitting: *Deleted

## 2014-11-17 DIAGNOSIS — Z79899 Other long term (current) drug therapy: Secondary | ICD-10-CM | POA: Diagnosis not present

## 2014-11-17 DIAGNOSIS — F418 Other specified anxiety disorders: Secondary | ICD-10-CM | POA: Diagnosis not present

## 2014-11-17 DIAGNOSIS — I10 Essential (primary) hypertension: Secondary | ICD-10-CM | POA: Diagnosis not present

## 2014-11-17 DIAGNOSIS — E669 Obesity, unspecified: Secondary | ICD-10-CM | POA: Insufficient documentation

## 2014-11-17 DIAGNOSIS — K219 Gastro-esophageal reflux disease without esophagitis: Secondary | ICD-10-CM | POA: Insufficient documentation

## 2014-11-17 DIAGNOSIS — Z6830 Body mass index (BMI) 30.0-30.9, adult: Secondary | ICD-10-CM | POA: Diagnosis not present

## 2014-11-17 DIAGNOSIS — H25811 Combined forms of age-related cataract, right eye: Secondary | ICD-10-CM | POA: Insufficient documentation

## 2014-11-17 DIAGNOSIS — E039 Hypothyroidism, unspecified: Secondary | ICD-10-CM | POA: Diagnosis not present

## 2014-11-17 HISTORY — PX: CATARACT EXTRACTION W/PHACO: SHX586

## 2014-11-17 SURGERY — PHACOEMULSIFICATION, CATARACT, WITH IOL INSERTION
Anesthesia: Monitor Anesthesia Care | Site: Eye | Laterality: Right

## 2014-11-17 MED ORDER — TETRACAINE HCL 0.5 % OP SOLN
1.0000 [drp] | OPHTHALMIC | Status: AC
  Administered 2014-11-17 (×3): 1 [drp] via OPHTHALMIC

## 2014-11-17 MED ORDER — LIDOCAINE HCL (PF) 1 % IJ SOLN
INTRAMUSCULAR | Status: DC | PRN
Start: 2014-11-17 — End: 2014-11-17
  Administered 2014-11-17: .5 mL

## 2014-11-17 MED ORDER — LIDOCAINE HCL 3.5 % OP GEL
1.0000 "application " | Freq: Once | OPHTHALMIC | Status: AC
  Administered 2014-11-17: 1 via OPHTHALMIC

## 2014-11-17 MED ORDER — FENTANYL CITRATE (PF) 100 MCG/2ML IJ SOLN
INTRAMUSCULAR | Status: AC
  Filled 2014-11-17: qty 2

## 2014-11-17 MED ORDER — MIDAZOLAM HCL 2 MG/2ML IJ SOLN
INTRAMUSCULAR | Status: AC
  Filled 2014-11-17: qty 2

## 2014-11-17 MED ORDER — POVIDONE-IODINE 5 % OP SOLN
OPHTHALMIC | Status: DC | PRN
  Administered 2014-11-17: 1 via OPHTHALMIC

## 2014-11-17 MED ORDER — MIDAZOLAM HCL 2 MG/2ML IJ SOLN
1.0000 mg | INTRAMUSCULAR | Status: DC | PRN
Start: 2014-11-17 — End: 2014-11-17
  Administered 2014-11-17: 2 mg via INTRAVENOUS

## 2014-11-17 MED ORDER — FENTANYL CITRATE (PF) 100 MCG/2ML IJ SOLN
25.0000 ug | INTRAMUSCULAR | Status: AC
  Administered 2014-11-17 (×2): 25 ug via INTRAVENOUS

## 2014-11-17 MED ORDER — NEOMYCIN-POLYMYXIN-DEXAMETH 3.5-10000-0.1 OP SUSP
OPHTHALMIC | Status: DC | PRN
  Administered 2014-11-17: 2 [drp] via OPHTHALMIC

## 2014-11-17 MED ORDER — PROVISC 10 MG/ML IO SOLN
INTRAOCULAR | Status: DC | PRN
  Administered 2014-11-17: 0.85 mL via INTRAOCULAR

## 2014-11-17 MED ORDER — EPINEPHRINE HCL 1 MG/ML IJ SOLN
INTRAMUSCULAR | Status: DC | PRN
Start: 2014-11-17 — End: 2014-11-17
  Administered 2014-11-17: 13:00:00

## 2014-11-17 MED ORDER — EPINEPHRINE HCL 1 MG/ML IJ SOLN
INTRAMUSCULAR | Status: AC
  Filled 2014-11-17: qty 1

## 2014-11-17 MED ORDER — ONDANSETRON HCL 4 MG/2ML IJ SOLN: 4.0000 mg | Freq: Once | INTRAMUSCULAR | Status: DC | PRN

## 2014-11-17 MED ORDER — CYCLOPENTOLATE-PHENYLEPHRINE 0.2-1 % OP SOLN
1.0000 [drp] | OPHTHALMIC | Status: AC
  Administered 2014-11-17 (×3): 1 [drp] via OPHTHALMIC

## 2014-11-17 MED ORDER — PHENYLEPHRINE HCL 2.5 % OP SOLN
1.0000 [drp] | OPHTHALMIC | Status: AC
Start: 2014-11-17 — End: 2014-11-17
  Administered 2014-11-17 (×3): 1 [drp] via OPHTHALMIC

## 2014-11-17 MED ORDER — LACTATED RINGERS IV SOLN
INTRAVENOUS | Status: DC
  Administered 2014-11-17: 12:00:00 via INTRAVENOUS

## 2014-11-17 MED ORDER — BSS IO SOLN
INTRAOCULAR | Status: DC | PRN
Start: 2014-11-17 — End: 2014-11-17
  Administered 2014-11-17: 15 mL

## 2014-11-17 MED ORDER — FENTANYL CITRATE (PF) 100 MCG/2ML IJ SOLN: 25.0000 ug | INTRAMUSCULAR | Status: DC | PRN

## 2014-11-17 SURGICAL SUPPLY — 11 items
CLOTH BEACON ORANGE TIMEOUT ST (SAFETY) ×1 IMPLANT
EYE SHIELD UNIVERSAL CLEAR (GAUZE/BANDAGES/DRESSINGS) ×1 IMPLANT
GLOVE BIOGEL PI IND STRL 7.0 (GLOVE) IMPLANT
GLOVE BIOGEL PI INDICATOR 7.0 (GLOVE) ×1
GLOVE EXAM NITRILE MD LF STRL (GLOVE) ×1 IMPLANT
PAD ARMBOARD 7.5X6 YLW CONV (MISCELLANEOUS) ×1 IMPLANT
SIGHTPATH CAT PROC W REG LENS (Ophthalmic Related) ×2 IMPLANT
SYRINGE LUER LOK 1CC (MISCELLANEOUS) ×1 IMPLANT
TAPE SURG TRANSPORE 1 IN (GAUZE/BANDAGES/DRESSINGS) IMPLANT
TAPE SURGICAL TRANSPORE 1 IN (GAUZE/BANDAGES/DRESSINGS) ×1
WATER STERILE IRR 250ML POUR (IV SOLUTION) ×1 IMPLANT

## 2014-11-17 NOTE — Anesthesia Postprocedure Evaluation (Signed)
  Anesthesia Post-op Note  Patient: Debra Sanchez  Procedure(s) Performed: Procedure(s): CATARACT EXTRACTION PHACO AND INTRAOCULAR LENS PLACEMENT RIGHT EYE CDE=6.57 (Right)  Patient Location: Short Stay  Anesthesia Type:MAC  Level of Consciousness: awake, alert , oriented and patient cooperative  Airway and Oxygen Therapy: Patient Spontanous Breathing  Post-op Pain: none  Post-op Assessment: Post-op Vital signs reviewed, Patient's Cardiovascular Status Stable, Respiratory Function Stable, Patent Airway, No signs of Nausea or vomiting and Pain level controlled              Post-op Vital Signs: Reviewed and stable  Last Vitals:  Filed Vitals:   11/17/14 1225  BP: 123/61  Pulse:   Temp:   Resp: 12    Complications: No apparent anesthesia complications

## 2014-11-17 NOTE — Transfer of Care (Signed)
Immediate Anesthesia Transfer of Care Note  Patient: Debra Sanchez  Procedure(s) Performed: Procedure(s): CATARACT EXTRACTION PHACO AND INTRAOCULAR LENS PLACEMENT RIGHT EYE CDE=6.57 (Right)  Patient Location: Short Stay  Anesthesia Type:MAC  Level of Consciousness: awake, alert , oriented and patient cooperative  Airway & Oxygen Therapy: Patient Spontanous Breathing  Post-op Assessment: Report given to RN, Post -op Vital signs reviewed and stable and Patient moving all extremities  Post vital signs: Reviewed and stable  Last Vitals:  Filed Vitals:   11/17/14 1225  BP: 123/61  Pulse:   Temp:   Resp: 12    Complications: No apparent anesthesia complications

## 2014-11-17 NOTE — Anesthesia Preprocedure Evaluation (Signed)
Anesthesia Evaluation  Patient identified by MRN, date of birth, ID band Patient awake    Reviewed: Allergy & Precautions, H&P , NPO status , Patient's Chart, lab work & pertinent test results  Airway Mallampati: III  TM Distance: >3 FB     Dental  (+) Teeth Intact   Pulmonary neg pulmonary ROS,  breath sounds clear to auscultation        Cardiovascular hypertension, Pt. on medications Rate:Normal     Neuro/Psych PSYCHIATRIC DISORDERS Anxiety Depression    GI/Hepatic PUD, GERD-  ,  Endo/Other  Hypothyroidism   Renal/GU      Musculoskeletal  (+) Arthritis -,   Abdominal (+) + obese,   Peds  Hematology  (+) anemia ,   Anesthesia Other Findings   Reproductive/Obstetrics                             Anesthesia Physical Anesthesia Plan  ASA: II  Anesthesia Plan: MAC   Post-op Pain Management:    Induction: Intravenous  Airway Management Planned: Nasal Cannula  Additional Equipment:   Intra-op Plan:   Post-operative Plan:   Informed Consent: I have reviewed the patients History and Physical, chart, labs and discussed the procedure including the risks, benefits and alternatives for the proposed anesthesia with the patient or authorized representative who has indicated his/her understanding and acceptance.     Plan Discussed with:   Anesthesia Plan Comments: (Surgery cancelled , pt had syncopal episode in pre op. Probable vagal reaction when IV started. Pt returned to stable VS and has no chest pain or other complaints. EKG shows NSR at 84. Will transfer to ED for further evaluation.)        Anesthesia Quick Evaluation

## 2014-11-17 NOTE — Discharge Instructions (Signed)

## 2014-11-17 NOTE — H&P (Signed)
I have reviewed the H&P, the patient was re-examined, and I have identified no interval changes in medical condition and plan of care since the history and physical of record  

## 2014-11-17 NOTE — Op Note (Signed)
Date of Admission: 11/17/2014  Date of Surgery: 11/17/2014   Pre-Op Dx: Cataract Right Eye  Post-Op Dx: Senile Combined Cataract Right  Eye,  Dx Code L46.503  Surgeon: Tonny Branch, M.D.  Assistants: None  Anesthesia: Topical with MAC  Indications: Painless, progressive loss of vision with compromise of daily activities.  Surgery: Cataract Extraction with Intraocular lens Implant Right Eye  Discription: The patient had dilating drops and viscous lidocaine placed into the Right eye in the pre-op holding area. After transfer to the operating room, a time out was performed. The patient was then prepped and draped. Beginning with a 61 degree blade a paracentesis port was made at the surgeon's 2 o'clock position. The anterior chamber was then filled with 1% non-preserved lidocaine. This was followed by filling the anterior chamber with Provisc.  A 2.60mm keratome blade was used to make a clear corneal incision at the temporal limbus.  A bent cystatome needle was used to create a continuous tear capsulotomy. Hydrodissection was performed with balanced salt solution on a Fine canula. The lens nucleus was then removed using the phacoemulsification handpiece. Residual cortex was removed with the I&A handpiece. The anterior chamber and capsular bag were refilled with Provisc. A posterior chamber intraocular lens was placed into the capsular bag with it's injector. The implant was positioned with the Kuglan hook. The Provisc was then removed from the anterior chamber and capsular bag with the I&A handpiece. Stromal hydration of the main incision and paracentesis port was performed with BSS on a Fine canula. The wounds were tested for leak which was negative. The patient tolerated the procedure well. There were no operative complications. The patient was then transferred to the recovery room in stable condition.  Complications: None  Specimen: None  EBL: None  Prosthetic device: Hoya iSert 250, power 21.0 D,  SN [id number].

## 2014-11-18 ENCOUNTER — Encounter (HOSPITAL_COMMUNITY): Payer: Self-pay | Admitting: Ophthalmology

## 2014-12-26 ENCOUNTER — Encounter: Payer: Self-pay | Admitting: Cardiology

## 2014-12-26 ENCOUNTER — Ambulatory Visit (INDEPENDENT_AMBULATORY_CARE_PROVIDER_SITE_OTHER): Payer: Medicare Other | Admitting: Cardiology

## 2014-12-26 VITALS — BP 133/85 | HR 72 | Ht 68.0 in | Wt 201.0 lb

## 2014-12-26 DIAGNOSIS — I951 Orthostatic hypotension: Secondary | ICD-10-CM | POA: Diagnosis not present

## 2014-12-26 DIAGNOSIS — I10 Essential (primary) hypertension: Secondary | ICD-10-CM

## 2014-12-26 NOTE — Progress Notes (Signed)
Patient ID: Debra Sanchez, female   DOB: [birth year], 74 y.o.   MRN: [id number]     Clinical Summary Debra Sanchez is a 74 y.o.female seen today for follow up of the following medical problems.   1. Syncope - 1st episode 3 months ago. Episode of syncope shortly after IV placed preop for eye surgery. Seen in ER at [facility], discharged home. Hgb 9.3, trop neg. EKG NSR - for 2 months after ER visit  would feel dizzy after standing in the morning.  - she was found to be orthostatic in clinic previously. We changed her HCTZ to prn only and encouraged increased oral intake.  - no recurrent issues since last visit    2. Diarrhea - started 2-3 months ago. Loose stools, 3-4 BMS per day. No abdominal pain.      Past Medical History  Diagnosis Date  . Hypertension   . Anemia   . Anxiety   . Arthritis   . Blood transfusion   . Gastric ulcer   . Hypothyroidism   . Depression   . GERD (gastroesophageal reflux disease)   . Heart murmur      Allergies  Allergen Reactions  . Codeine Itching  . Morphine And Related Itching  . Sulfa Antibiotics Nausea And Vomiting     Current Outpatient Prescriptions  Medication Sig Dispense Refill  . amLODipine (NORVASC) 2.5 MG tablet Take 2.5 mg by mouth daily.    Marland Kitchen CALCIUM PO Take 1 tablet by mouth daily.    . Cholecalciferol (VITAMIN D PO) Take 1 tablet by mouth daily.    . Cyanocobalamin (B-12 PO) Take 1 tablet by mouth.    . hydrochlorothiazide (MICROZIDE) 12.5 MG capsule Take 1 capsule (12.5 mg total) by mouth daily as needed (swelling). 90 capsule 0  . IRON PO Take 1 tablet by mouth daily.    Marland Kitchen levothyroxine (SYNTHROID, LEVOTHROID) 50 MCG tablet Take 50 mcg by mouth daily.    Marland Kitchen losartan (COZAAR) 100 MG tablet Take 1 tablet (100 mg total) by mouth daily. 90 tablet 3  . MULTIPLE VITAMINS PO Take 1 tablet by mouth daily.     . Omega-3-6-9 CAPS Take 1 capsule by mouth daily.     . pantoprazole (PROTONIX) 40 MG tablet TAKE (1) TABLET  TWICE A DAY BEFORE MEALS. 60 tablet 3  . sertraline (ZOLOFT) 100 MG tablet Take 50 mg by mouth daily.     . SUMAtriptan (IMITREX) 100 MG tablet Take 100 mg by mouth every 2 (two) hours as needed for migraine. May repeat in 2 hours if headache persists or recurs.    . traMADol (ULTRAM) 50 MG tablet Take 50 mg by mouth every 6 (six) hours as needed for moderate pain.     No current facility-administered medications for this visit.     Past Surgical History  Procedure Laterality Date  . Abdominal hysterectomy    . Bilateral oophorectomy    . Neck surgery      cervical disc  . 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 Esophagogastroduodenoscopy  11/05/2010    Procedure: ESOPHAGOGASTRODUODENOSCOPY (EGD);  Surgeon: Rogene Houston, MD;  [facility];  Service: Endoscopy;  Laterality: N/A;  7:00 am per [facility]  . Colonoscopy  01/19/2011    Procedure: COLONOSCOPY;  Surgeon: Rogene Houston, MD;  [facility];  Service: Endoscopy;  Laterality: N/A;  2:00  . Esophagogastroduodenoscopy N/A 07/25/2012  Procedure: ESOPHAGOGASTRODUODENOSCOPY (EGD);  Surgeon: Rogene Houston, MD;  [facility];  Service: Endoscopy;  Laterality: N/A;  400-moved to 70 Ann notified pt  . Cataract extraction w/phaco Left 10/20/2014    Procedure: CATARACT EXTRACTION PHACO AND INTRAOCULAR LENS PLACEMENT LEFT EYE;  Surgeon: Tonny Shaunte Tuft, MD;  Location: [facility];  Service: Ophthalmology;  Laterality: Left;  CDE:6.98  . Cataract extraction w/phaco Right 11/17/2014    Procedure: CATARACT EXTRACTION PHACO AND INTRAOCULAR LENS PLACEMENT RIGHT EYE CDE=6.57;  Surgeon: Tonny Caiya Bettes, MD;  Location: [facility];  Service: Ophthalmology;  Laterality: Right;     Allergies  Allergen Reactions  . Codeine Itching  . Morphine And Related Itching  . Sulfa Antibiotics Nausea And Vomiting      Family History  Problem Relation Age of Onset  . Colon cancer Mother      Social  History Debra Sanchez reports that she has never smoked. She has never used smokeless tobacco. Debra Sanchez reports that she does not drink alcohol.   Review of Systems CONSTITUTIONAL: No weight loss, fever, chills, weakness or fatigue.  HEENT: Eyes: No visual loss, blurred vision, double vision or yellow sclerae.No hearing loss, sneezing, congestion, runny nose or sore throat.  SKIN: No rash or itching.  CARDIOVASCULAR: no chest pain, no palpitations RESPIRATORY: No shortness of breath, cough or sputum.  GASTROINTESTINAL: +diarrhea GENITOURINARY: No burning on urination, no polyuria NEUROLOGICAL: No headache, dizziness, syncope, paralysis, ataxia, numbness or tingling in the extremities. No change in bowel or bladder control.  MUSCULOSKELETAL: No muscle, back pain, joint pain or stiffness.  LYMPHATICS: No enlarged nodes. No history of splenectomy.  PSYCHIATRIC: No history of depression or anxiety.  ENDOCRINOLOGIC: No reports of sweating, cold or heat intolerance. No polyuria or polydipsia.  Marland Kitchen   Physical Examination Filed Vitals:   12/26/14 1133  BP: 133/85  Pulse: 72   Filed Vitals:   12/26/14 1133  Height: 5\' 8"  (1.727 m)  Weight: 201 lb (91.173 kg)    Gen: resting comfortably, no acute distress HEENT: no scleral icterus, pupils equal round and reactive, no palptable cervical adenopathy,  CV: RRR, no m/r/g, no jVD Resp: Clear to auscultation bilaterally GI: abdomen is soft, non-tender, non-distended, normal bowel sounds, no hepatosplenomegaly MSK: extremities are warm, no edema.  Skin: warm, no rash Neuro:  no focal deficits Psych: appropriate affect   Diagnostic Studies 05/2014 Echo Study Conclusions  - Left ventricle: The cavity size was normal. Wall thickness was increased in a pattern of mild LVH. The estimated ejection fraction was 60%. Wall motion was normal; there were no regional wall motion abnormalities. - Aortic valve: Sclerosis without stenosis.  There was no regurgitation. - Right ventricle: The cavity size was normal. Systolic function was normal. - Inferior vena cava: The vessel was normal in size. The respirophasic diameter changes were in the normal range (>= 50%), consistent with normal central venous pressure.      Assessment and Plan  1. Syncope - resolved, no recurrent episodes. Continue to follow  2. Diarrhea - she will f/u with GI - she reports generalized fatigue which may be related, will check labs  3. HTN - notes some occasional high bp's at home, asked to keep bp log for 2 weeks and submit      Arnoldo Lenis, M.D.

## 2014-12-26 NOTE — Patient Instructions (Signed)
Your physician recommends that you schedule a follow-up appointment AS NEEDED WITH DR. Charlotte  Your physician recommends that you continue on your current medications as directed. Please refer to the Current Medication list given to you today.  Your physician recommends that you return for lab work [facility]  Your physician has requested that you regularly monitor and record your blood pressure readings at home FOR 2 [place]. Please use the same machine at the same time of day to check your readings and record them to bring to your follow-up visit.  WE HAVE GIVEN YOU A LIST OF PRIMARY CARE PHYSICIANS  Thank you for choosing [facility]!!

## 2015-01-02 ENCOUNTER — Telehealth: Payer: Self-pay | Admitting: *Deleted

## 2015-01-02 ENCOUNTER — Encounter: Payer: Self-pay | Admitting: *Deleted

## 2015-01-02 NOTE — Telephone Encounter (Signed)
LM with no return call, mailed pt letter of normal results, routed to pcp

## 2015-01-02 NOTE — Telephone Encounter (Signed)
-----   Message from Arnoldo Lenis, MD sent at 01/02/2015  4:12 PM EDT ----- Labs look good  Zandra Abts MD

## 2015-02-04 ENCOUNTER — Telehealth: Payer: Self-pay | Admitting: *Deleted

## 2015-02-04 NOTE — Telephone Encounter (Signed)
Pt aware.

## 2015-02-04 NOTE — Telephone Encounter (Signed)
-----   Message from Arnoldo Lenis, MD sent at 02/04/2015 12:06 PM EDT ----- BP log reviewed, overall looks good. No changes   Zandra Abts MD

## 2015-04-06 ENCOUNTER — Other Ambulatory Visit: Payer: Self-pay | Admitting: Cardiology

## 2015-04-20 ENCOUNTER — Ambulatory Visit (HOSPITAL_BASED_OUTPATIENT_CLINIC_OR_DEPARTMENT_OTHER)
Admission: RE | Admit: 2015-04-20 | Discharge: 2015-04-20 | Disposition: A | Discharge status: Home / Self Care | Payer: Medicare Other | Source: Ambulatory Visit | Attending: Family Medicine | Admitting: Family Medicine

## 2015-04-20 ENCOUNTER — Encounter: Payer: Self-pay | Admitting: Family Medicine

## 2015-04-20 ENCOUNTER — Ambulatory Visit (INDEPENDENT_AMBULATORY_CARE_PROVIDER_SITE_OTHER): Payer: Medicare Other | Admitting: Family Medicine

## 2015-04-20 ENCOUNTER — Telehealth: Payer: Self-pay | Admitting: Family Medicine

## 2015-04-20 VITALS — BP 113/75 | HR 78 | Temp 97.9°F | Resp 20 | Ht 67.0 in | Wt 203.8 lb

## 2015-04-20 DIAGNOSIS — J189 Pneumonia, unspecified organism: Secondary | ICD-10-CM | POA: Insufficient documentation

## 2015-04-20 DIAGNOSIS — J0100 Acute maxillary sinusitis, unspecified: Secondary | ICD-10-CM | POA: Diagnosis not present

## 2015-04-20 DIAGNOSIS — Z7189 Other specified counseling: Secondary | ICD-10-CM | POA: Diagnosis not present

## 2015-04-20 DIAGNOSIS — Z7689 Persons encountering health services in other specified circumstances: Secondary | ICD-10-CM

## 2015-04-20 DIAGNOSIS — R918 Other nonspecific abnormal finding of lung field: Secondary | ICD-10-CM | POA: Insufficient documentation

## 2015-04-20 DIAGNOSIS — R05 Cough: Secondary | ICD-10-CM | POA: Diagnosis not present

## 2015-04-20 DIAGNOSIS — R9389 Abnormal findings on diagnostic imaging of other specified body structures: Secondary | ICD-10-CM

## 2015-04-20 DIAGNOSIS — K449 Diaphragmatic hernia without obstruction or gangrene: Secondary | ICD-10-CM | POA: Diagnosis not present

## 2015-04-20 DIAGNOSIS — G4700 Insomnia, unspecified: Secondary | ICD-10-CM | POA: Diagnosis not present

## 2015-04-20 MED ORDER — FLUTICASONE PROPIONATE 50 MCG/ACT NA SUSP: 2.0000 | Freq: Every day | NASAL | Status: DC

## 2015-04-20 MED ORDER — AMOXICILLIN-POT CLAVULANATE 875-125 MG PO TABS: 1.0000 | ORAL_TABLET | Freq: Two times a day (BID) | ORAL | Status: DC

## 2015-04-20 MED ORDER — TRAZODONE HCL 50 MG PO TABS: 25.0000 mg | ORAL_TABLET | Freq: Every evening | ORAL | Status: DC | PRN

## 2015-04-20 NOTE — Progress Notes (Signed)
Subjective:    Patient ID: Debra Sanchez, female    DOB: 08/01/1940, 75 y.o.   MRN: [id number]  HPI   Patient presents for new patient establishment with complaints of sinuspressure. All past medical history, surgical history, allergies, family history, immunizations and social history was obtained from the patient today and entered into the electronic medical record. Records are requested from her prior PCP, and will be reviewed at the time they are received. All medical records will be updated at that time.  Sinus pressure: Patient states a few days before Christmas, he 3 weeks ago, she endorsed  dizziness, nausea, fever for 2 days, then improved. She thought it was just a minor cold, that it would cause away. Now she endorses nasal congestions, rhinorrhea, shortness of breath, weak, PND. Denies continued fever and chills. Eating and drinking well. Left maxillary sinus pain. All grandchildren are ill.  No asthma and COPD history.  OTC: cough medicine, antihistamine.   Insomnia: Pt states she has had anxiety for some time. Has been on Zoloft for many years. States it is just not working any longer. 100 mg of Zoloft daily. Goes to bed at 10 pm. No TV.    Past Medical History  Diagnosis Date  . Hypertension   . Anemia   . Anxiety   . Arthritis   . Blood transfusion   . Gastric ulcer   . Hypothyroidism   . Depression   . GERD (gastroesophageal reflux disease)   . Heart murmur   . Migraines   . Degenerative disc disease, lumbar   . Osteoporosis   . DDD (degenerative disc disease), thoracolumbar    Allergies  Allergen Reactions  . Codeine Itching  . Morphine And Related Itching  . Sulfa Antibiotics Nausea And Vomiting   Past Surgical History  Procedure Laterality Date  . Abdominal hysterectomy    . Bilateral oophorectomy    . Neck surgery      cervical disc  . 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 Esophagogastroduodenoscopy   11/05/2010    Procedure: ESOPHAGOGASTRODUODENOSCOPY (EGD);  Surgeon: Rogene Houston, MD;  [facility];  Service: Endoscopy;  Laterality: N/A;  7:00 am per [facility]  . Colonoscopy  01/19/2011    Procedure: COLONOSCOPY;  Surgeon: Rogene Houston, MD;  [facility];  Service: Endoscopy;  Laterality: N/A;  2:00  . Esophagogastroduodenoscopy N/A 07/25/2012    Procedure: ESOPHAGOGASTRODUODENOSCOPY (EGD);  Surgeon: Rogene Houston, MD;  [facility];  Service: Endoscopy;  Laterality: N/A;  400-moved to 44 Ann notified pt  . Cataract extraction w/phaco Left 10/20/2014    Procedure: CATARACT EXTRACTION PHACO AND INTRAOCULAR LENS PLACEMENT LEFT EYE;  Surgeon: Tonny Branch, MD;  Location: [facility];  Service: Ophthalmology;  Laterality: Left;  CDE:6.98  . Cataract extraction w/phaco Right 11/17/2014    Procedure: CATARACT EXTRACTION PHACO AND INTRAOCULAR LENS PLACEMENT RIGHT EYE CDE=6.57;  Surgeon: Tonny Branch, MD;  Location: [facility];  Service: Ophthalmology;  Laterality: Right;  . Thoracic tumor     Family History  Problem Relation Age of Onset  . Colon cancer Mother   . Heart disease Mother   . Arthritis Mother   . Heart disease Father   . Arthritis Father   . Arthritis Sister   . Arthritis Brother   . Arthritis Maternal Aunt   . Arthritis Maternal Uncle   . Diabetes Maternal Uncle  Social History   Social History  . Marital Status: Married    Spouse Name: N/A  . Number of Children: N/A  . Years of Education: N/A   Occupational History  . Not on file.   Social History Main Topics  . Smoking status: Never Smoker   . Smokeless tobacco: Never Used  . Alcohol Use: No  . Drug Use: No  . Sexual Activity: No   Other Topics Concern  . Not on file   Social History Narrative   Married to [employer].   2 children Kerin Ransom and Anne Ng)    Retired, 2 yrs college.    Caffeine beverages.   Takes a daily vitamin.   Wears her seatbelt. Smoke detector in the home.     Feels safe in her relationships.                        Review of Systems Negative, with the exception of above mentioned in HPI      Objective:   Physical Exam BP 113/75 mmHg  Pulse 78  Temp(Src) 97.9 F (36.6 C)  Resp 20  Ht 5\' 7"  (1.702 m)  Wt 203 lb 12 oz (92.42 kg)  BMI 31.90 kg/m2  SpO2 98% Gen: Afebrile. No acute distress. Nontoxic in appearance, well-developed, well-nourished, Caucasian female. Pleasant. HENT: AT. [place]. Bilateral TM visualized full/shiny, no erythema or bulging. MMM, no oral lesions. Bilateral nares erythema and swelling. Throat without erythema or exudates. Tenderness to palpation bilateral maxillary sinus. No cough presently exam, no hoarseness present on exam. Eyes:Pupils Equal Round Reactive to light, Extraocular movements intact,  Conjunctiva without redness, discharge or icterus. Neck/lymp/endocrine: Supple, mild anterior cervical lymphadenopathy CV: RRR  Chest: CTAB, no wheeze or crackles. Normal respiratory effort. Moderate air movement. Abd: Soft.  NTND. BS present MSK: No erythema, no soft tissue swelling, no obvious deformities. Normal range of motion. Neurovascularly intact distally. Skin: No rashes, purpura or petechiae.  Neuro: Normal gait. PERLA. EOMi. Alert. Oriented x3  Psych: Normal affect, dress and demeanor. Normal speech. Normal thought content and judgment.     Assessment & Plan:  Debra Sanchez is a 75 y.o. female present for establishment of care with complaints of sinus pressure and insomnia. 1. Acute maxillary sinusitis, recurrence not specified--> PNA by CXR (see below) - Chest x-ray to rule out pneumonia considering the length of symptoms. Encouraged her to use Flonase, Mucinex, he would've higher nasal saline daily. - amoxicillin-clavulanate (AUGMENTIN) 875-125 MG tablet; Take 1 tablet by mouth 2 (two) times daily.  Dispense: 20 tablet; Refill: 0 - fluticasone (FLONASE) 50 MCG/ACT nasal spray; Place 2 sprays into  both nostrils daily.  Dispense: 16 g; Refill: 6 - DG Chest 2 View; Future  2. Insomnia - Continue Zoloft, and 25 mg of trazodone on her before bedtime. Patient to start this in approximately 1-2 weeks after acute illness is over. - traZODone (DESYREL) 50 MG tablet; Take 0.5-1 tablets (25-50 mg total) by mouth at bedtime as needed for sleep.  Dispense: 60 tablet; Refill: 3  Dg Chest 2 View  04/20/2015  CLINICAL DATA:  Productive cough. EXAM: CHEST  2 VIEW COMPARISON:  None. FINDINGS: Mediastinum and hilar structures normal. Mild right base subsegmental atelectasis and or infiltrate. Underlying pulmonary small mass lesion cannot be excluded and follow-up chest x-ray to demonstrate clearing suggested . Heart size normal. No pleural effusion or pneumothorax. Sliding hiatal hernia. No acute bony abnormality . Prior cervical spine fusion. Degenerative changes  thoracic spine. IMPRESSION: 1. Mild right base subsegmental atelectasis and or infiltrate. Underlying small mass lesion cannot be excluded and follow-up chest x-ray suggested to demonstrate clearing. 2. Prominent sliding hiatal hernia. Electronically Signed   By: Marcello Moores  Register   On: 04/20/2015 14:35

## 2015-04-20 NOTE — Telephone Encounter (Signed)
Please call pt: - It appears she may have a small PNA. I will need to see her in 4 weeks with follow up xray to make certain PNA is cleared and there is not a mass in that location.  - She also has a hiatal hernia, uncertain if she is aware. We can discuss all of this in more detail at her appt in 4 weeks. I do want a CXR compelted prior to that appt.

## 2015-04-20 NOTE — Patient Instructions (Signed)
It was a pleasure meeting you today, Please have chest xray completed,  I have called in Augmentin and flonase for your sinus infection Use mucinex (plain) for dripping of nose.  I have called in trazodone for your insomnia. Start in about 1 week, you should start at 1/2 tab, and then can use whole tab if needed,

## 2015-04-21 NOTE — Telephone Encounter (Signed)
Spoke with patient reviewed xray results. Scheduled appt for follow up.

## 2015-04-29 ENCOUNTER — Encounter (INDEPENDENT_AMBULATORY_CARE_PROVIDER_SITE_OTHER): Payer: Self-pay | Admitting: Internal Medicine

## 2015-04-29 ENCOUNTER — Ambulatory Visit (INDEPENDENT_AMBULATORY_CARE_PROVIDER_SITE_OTHER): Payer: Medicare Other | Admitting: Internal Medicine

## 2015-04-29 VITALS — BP 112/48 | HR 76 | Temp 97.0°F | Ht 67.0 in | Wt 204.8 lb

## 2015-04-29 DIAGNOSIS — K921 Melena: Secondary | ICD-10-CM | POA: Diagnosis not present

## 2015-04-29 DIAGNOSIS — D509 Iron deficiency anemia, unspecified: Secondary | ICD-10-CM

## 2015-04-29 LAB — CBC
HCT: 29 % — ABNORMAL LOW (ref 36.0–46.0)
Hemoglobin: 8.8 g/dL — ABNORMAL LOW (ref 12.0–15.0)
MCH: 25.5 pg — ABNORMAL LOW (ref 26.0–34.0)
MCHC: 30.3 g/dL (ref 30.0–36.0)
MCV: 84.1 fL (ref 78.0–100.0)
MPV: 9.7 fL (ref 8.6–12.4)
Platelets: 362 10*3/uL (ref 150–400)
RBC: 3.45 MIL/uL — ABNORMAL LOW (ref 3.87–5.11)
RDW: 15.7 % — ABNORMAL HIGH (ref 11.5–15.5)
WBC: 5.4 10*3/uL (ref 4.0–10.5)

## 2015-04-29 NOTE — Progress Notes (Signed)
Subjective:    Patient ID: Debra Sanchez, female    DOB: 02-09-1941, 75 y.o.   MRN: [id number]  HPI Presents today with c/o melena. She tells me she has had black stools about a week ago. She says she just ignored it.  Hx of melena and her last EGD was in 2014. She says she has been weak since before Christmas. She has had a sinus infection which went into pneumonia.  He is on Augmentin now. She says she is SOB. Hx of anemia and upper GI bleed.  Appetite is good. No weight loss. She eats small meals. She says she has tenderness in her epigastric region.  She denies any acid reflux.  She has been off the Protonix. She restarted the Protonix four days ago. No NSAIDs.   CBC    Component Value Date/Time   WBC 4.7 10/15/2014 1015   RBC 3.89 10/15/2014 1015   HGB 11.2* 10/15/2014 1015   HCT 35.2* 10/15/2014 1015   PLT 240 10/15/2014 1015   MCV 90.5 10/15/2014 1015   MCH 28.8 10/15/2014 1015   MCHC 31.8 10/15/2014 1015   RDW 14.2 10/15/2014 1015   LYMPHSABS 1.9 02/03/2014 0902   MONOABS 0.2 02/03/2014 0902   EOSABS 0.1 02/03/2014 0902   BASOSABS 0.0 02/03/2014 0902        07/25/2012 EGD  Indications: Patient is 75 year old Caucasian female with history of peptic ulcer disease and known large hiatal hernia who presents with intermittent melena and drop in her H&H. She is undergoing diagnostic EGD. She says her heartburn is well controlled with therapy. Her PPI dose was doubled at the time of office visit.  Impression: No evidence of erosive esophagitis. Large sliding hiatal hernia with focal gastritis at the level of hiatus and no erosions or ulcers noted. Few small telangiectasia noted in the prepyloric region without bleeding. No evidence of peptic ulcer disease.    10/29/10 EGD Dr. Laural Golden for upper GI bleed:FINAL DIAGNOSIS: Moderate size sliding  hiatal hernia with erosions and  an ulcer at the level of hiatus. Ulcer with stigmata of bleeding and  was sealed with Gold probe.  Multiple antral erosions with three ulcers and two had stigmata of bleed and these were treated also with Gold probe.  01/2011 EGD and Colonoscopy: Impression:  Moderate size sliding hiatal hernia with linear erosions at the level of hiatus.  All 3 ulcers have healed; 2 of which were at antrum and one was at the level of hiatus.  Normal terminal ileum.  6 mm polyp snared from proximal sigmoid colon Biopsy: Tubular adenoma. No high grade dysplasia.    Review of Systems Past Medical History  Diagnosis Date  . Hypertension   . Anemia   . Anxiety   . Arthritis   . Blood transfusion   . Gastric ulcer   . Hypothyroidism   . Depression   . GERD (gastroesophageal reflux disease)   . Heart murmur   . Migraines   . Degenerative disc disease, lumbar   . Osteoporosis   . DDD (degenerative disc disease), thoracolumbar     Past Surgical History  Procedure Laterality Date  . Abdominal hysterectomy    . Bilateral oophorectomy    . Neck surgery      cervical disc  . 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 Esophagogastroduodenoscopy  11/05/2010    Procedure: ESOPHAGOGASTRODUODENOSCOPY (EGD);  Surgeon: Rogene Houston, MD;  [facility];  Service: Endoscopy;  Laterality: N/A;  7:00 am per [facility]  . Colonoscopy  01/19/2011    Procedure: COLONOSCOPY;  Surgeon: Rogene Houston, MD;  [facility];  Service: Endoscopy;  Laterality: N/A;  2:00  . Esophagogastroduodenoscopy N/A 07/25/2012    Procedure: ESOPHAGOGASTRODUODENOSCOPY (EGD);  Surgeon: Rogene Houston, MD;  [facility];  Service: Endoscopy;  Laterality: N/A;  400-moved to 60 Ann notified pt  . Cataract extraction w/phaco Left 10/20/2014    Procedure: CATARACT EXTRACTION PHACO AND INTRAOCULAR LENS PLACEMENT LEFT EYE;  Surgeon:  Tonny Branch, MD;  Location: [facility];  Service: Ophthalmology;  Laterality: Left;  CDE:6.98  . Cataract extraction w/phaco Right 11/17/2014    Procedure: CATARACT EXTRACTION PHACO AND INTRAOCULAR LENS PLACEMENT RIGHT EYE CDE=6.57;  Surgeon: Tonny Branch, MD;  Location: [facility];  Service: Ophthalmology;  Laterality: Right;  . Thoracic tumor      Allergies  Allergen Reactions  . Codeine Itching  . Morphine And Related Itching  . Sulfa Antibiotics Nausea And Vomiting    Current Outpatient Prescriptions on File Prior to Visit  Medication Sig Dispense Refill  . amLODipine (NORVASC) 2.5 MG tablet Take 2.5 mg by mouth daily.    Marland Kitchen amoxicillin-clavulanate (AUGMENTIN) 875-125 MG tablet Take 1 tablet by mouth 2 (two) times daily. 20 tablet 0  . CALCIUM PO Take 1 tablet by mouth daily.    . Cholecalciferol (VITAMIN D PO) Take 1 tablet by mouth daily.    . Cyanocobalamin (B-12 PO) Take 1 tablet by mouth.    . fluticasone (FLONASE) 50 MCG/ACT nasal spray Place 2 sprays into both nostrils daily. 16 g 6  . hydrochlorothiazide (MICROZIDE) 12.5 MG capsule Take 1 capsule (12.5 mg total) by mouth daily as needed (swelling). 90 capsule 0  . IRON PO Take 325 tablets by mouth daily.     Marland Kitchen levothyroxine (SYNTHROID, LEVOTHROID) 50 MCG tablet Take 50 mcg by mouth daily.    Marland Kitchen losartan (COZAAR) 100 MG tablet TAKE (1) TABLET BY MOUTH ONCE DAILY. 30 tablet 6  . MULTIPLE VITAMINS PO Take 1 tablet by mouth daily.     . Omega-3 Fatty Acids (FISH OIL PO) Take 1 tablet by mouth daily.    . sertraline (ZOLOFT) 100 MG tablet Take 50 mg by mouth daily.     . SUMAtriptan (IMITREX) 100 MG tablet Take 100 mg by mouth every 2 (two) hours as needed for migraine. May repeat in 2 hours if headache persists or recurs.    . traMADol (ULTRAM) 50 MG tablet Take 50 mg by mouth every 6 (six) hours as needed for moderate pain.    . traZODone (DESYREL) 50 MG tablet Take 0.5-1 tablets (25-50 mg total) by mouth at bedtime as needed for sleep.  (Patient not taking: Reported on 04/29/2015) 60 tablet 3   No current facility-administered medications on file prior to visit.        Objective:   Physical Exam Blood pressure 112/48, pulse 76, temperature 97 F (36.1 C), height 5\' 7"  (1.702 m), weight 204 lb 12.8 oz (92.897 kg). Alert and oriented. Skin warm and dry. Oral mucosa is moist.   . Sclera anicteric, conjunctivae is pink. Thyroid not enlarged. No cervical lymphadenopathy. Lungs clear. Heart regular rate and rhythm.  Abdomen is soft. Bowel sounds are positive. No hepatomegaly. No abdominal masses felt. No tenderness.  No edema to lower extremities. Stool dark brown and guaiac negative. (I showed patient)    Lot  [id number] Ex 9/17     Assessment & Plan:  Melena. Stool was guaiac negative in office today. Am going to get a CBC today. Three stool cards given to patient.

## 2015-04-29 NOTE — Patient Instructions (Addendum)
Cbc today.  Protonix twice a day

## 2015-04-30 ENCOUNTER — Telehealth (INDEPENDENT_AMBULATORY_CARE_PROVIDER_SITE_OTHER): Payer: Self-pay | Admitting: Internal Medicine

## 2015-04-30 ENCOUNTER — Telehealth (INDEPENDENT_AMBULATORY_CARE_PROVIDER_SITE_OTHER): Payer: Self-pay | Admitting: *Deleted

## 2015-04-30 DIAGNOSIS — K279 Peptic ulcer, site unspecified, unspecified as acute or chronic, without hemorrhage or perforation: Secondary | ICD-10-CM

## 2015-04-30 DIAGNOSIS — K921 Melena: Secondary | ICD-10-CM

## 2015-04-30 DIAGNOSIS — D509 Iron deficiency anemia, unspecified: Secondary | ICD-10-CM

## 2015-04-30 NOTE — Telephone Encounter (Signed)
.  Per Lelon Perla the patient is to have a CBC week of 05/04/15.

## 2015-04-30 NOTE — Telephone Encounter (Signed)
CBC ordered 

## 2015-05-07 ENCOUNTER — Ambulatory Visit (HOSPITAL_BASED_OUTPATIENT_CLINIC_OR_DEPARTMENT_OTHER)
Admission: RE | Admit: 2015-05-07 | Discharge: 2015-05-07 | Disposition: A | Discharge status: Home / Self Care | Payer: Medicare Other | Source: Ambulatory Visit | Attending: Family Medicine | Admitting: Family Medicine

## 2015-05-07 ENCOUNTER — Telehealth: Payer: Self-pay | Admitting: Family Medicine

## 2015-05-07 DIAGNOSIS — J189 Pneumonia, unspecified organism: Secondary | ICD-10-CM | POA: Diagnosis not present

## 2015-05-07 DIAGNOSIS — R9389 Abnormal findings on diagnostic imaging of other specified body structures: Secondary | ICD-10-CM

## 2015-05-07 DIAGNOSIS — R938 Abnormal findings on diagnostic imaging of other specified body structures: Secondary | ICD-10-CM | POA: Diagnosis not present

## 2015-05-07 NOTE — Telephone Encounter (Signed)
Attempted to call patient's contact numbers concerning abnormal chest x-ray. I was unable to reach the patient, and left a brief nondescript message for her to call back on her mobile phone and a brief more discript message on her home phone advising her that the small area we were concerned about had not completely cleared and we do need to move forward with a chest CT, just to make sure it is nothing concerning.  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 - I have placed an order for a BMP for her to have drawn as soon as possible, and a CT chest with contrast has been ordered as well. Patient should be encouraged to have blood drawn as soon as possible, as they will need this result prior to scheduling the CT chest.

## 2015-05-08 ENCOUNTER — Telehealth (INDEPENDENT_AMBULATORY_CARE_PROVIDER_SITE_OTHER): Payer: Self-pay | Admitting: *Deleted

## 2015-05-08 ENCOUNTER — Other Ambulatory Visit (HOSPITAL_COMMUNITY)
Admission: RE | Admit: 2015-05-08 | Discharge: 2015-05-08 | Disposition: A | Discharge status: Home / Self Care | Payer: Medicare Other | Source: Ambulatory Visit | Attending: Family Medicine | Admitting: Family Medicine

## 2015-05-08 DIAGNOSIS — R938 Abnormal findings on diagnostic imaging of other specified body structures: Secondary | ICD-10-CM | POA: Insufficient documentation

## 2015-05-08 DIAGNOSIS — K921 Melena: Secondary | ICD-10-CM | POA: Diagnosis not present

## 2015-05-08 DIAGNOSIS — K279 Peptic ulcer, site unspecified, unspecified as acute or chronic, without hemorrhage or perforation: Secondary | ICD-10-CM | POA: Diagnosis not present

## 2015-05-08 LAB — BASIC METABOLIC PANEL
Anion gap: 6 (ref 5–15)
BUN: 14 mg/dL (ref 6–20)
CO2: 28 mmol/L (ref 22–32)
Calcium: 9.2 mg/dL (ref 8.9–10.3)
Chloride: 105 mmol/L (ref 101–111)
Creatinine, Ser: 1.02 mg/dL — ABNORMAL HIGH (ref 0.44–1.00)
GFR calc Af Amer: >60 mL/min (ref >60)
GFR calc non Af Amer: 52 mL/min — ABNORMAL LOW (ref >60)
Glucose, Bld: 111 mg/dL — ABNORMAL HIGH (ref 65–99)
Potassium: 4.3 mmol/L (ref 3.5–5.1)
Sodium: 139 mmol/L (ref 135–145)

## 2015-05-08 LAB — CBC
HCT: 30.2 % — ABNORMAL LOW (ref 36.0–46.0)
Hemoglobin: 9.1 g/dL — ABNORMAL LOW (ref 12.0–15.0)
MCH: 25.2 pg — ABNORMAL LOW (ref 26.0–34.0)
MCHC: 30.1 g/dL (ref 30.0–36.0)
MCV: 83.7 fL (ref 78.0–100.0)
MPV: 10.1 fL (ref 8.6–12.4)
Platelets: 282 10*3/uL (ref 150–400)
RBC: 3.61 MIL/uL — ABNORMAL LOW (ref 3.87–5.11)
RDW: 15.8 % — ABNORMAL HIGH (ref 11.5–15.5)
WBC: 3.5 10*3/uL — ABNORMAL LOW (ref 4.0–10.5)

## 2015-05-08 NOTE — Telephone Encounter (Signed)
Results of stool card given to patient. She had CBC drawn today

## 2015-05-08 NOTE — Telephone Encounter (Signed)
Changed lab order so patient can get drawn at [facility]

## 2015-05-08 NOTE — Telephone Encounter (Signed)
   Diagnosis:    Result(s)   Card 1:  Negative:     Card 2:  Negative:   Card 3: Negative:    Completed by: Thomas Hoff, LPN   HEMOCCULT SENSA DEVELOPER: LOT#: [id number]  EXPIRATION DATE: 9-17  HEMOCCULT SENSA CARD:  LOT#:  [id number] EXPIRATION DATE: 07/18   CARD CONTROL RESULTS:  POSITIVE: Positive NEGATIVE: Negative    ADDITIONAL COMMENTS: Patient was called and given her results.

## 2015-05-11 ENCOUNTER — Ambulatory Visit (HOSPITAL_COMMUNITY)
Admission: RE | Admit: 2015-05-11 | Discharge: 2015-05-11 | Disposition: A | Discharge status: Home / Self Care | Payer: Medicare Other | Source: Ambulatory Visit | Attending: Family Medicine | Admitting: Family Medicine

## 2015-05-11 ENCOUNTER — Encounter (INDEPENDENT_AMBULATORY_CARE_PROVIDER_SITE_OTHER): Payer: Self-pay | Admitting: *Deleted

## 2015-05-11 ENCOUNTER — Telehealth (INDEPENDENT_AMBULATORY_CARE_PROVIDER_SITE_OTHER): Payer: Self-pay | Admitting: *Deleted

## 2015-05-11 ENCOUNTER — Ambulatory Visit: Payer: Medicare Other | Admitting: Family Medicine

## 2015-05-11 DIAGNOSIS — R938 Abnormal findings on diagnostic imaging of other specified body structures: Secondary | ICD-10-CM | POA: Diagnosis not present

## 2015-05-11 DIAGNOSIS — R05 Cough: Secondary | ICD-10-CM | POA: Insufficient documentation

## 2015-05-11 DIAGNOSIS — K279 Peptic ulcer, site unspecified, unspecified as acute or chronic, without hemorrhage or perforation: Secondary | ICD-10-CM

## 2015-05-11 DIAGNOSIS — R918 Other nonspecific abnormal finding of lung field: Secondary | ICD-10-CM | POA: Insufficient documentation

## 2015-05-11 DIAGNOSIS — R0602 Shortness of breath: Secondary | ICD-10-CM | POA: Insufficient documentation

## 2015-05-11 DIAGNOSIS — K921 Melena: Secondary | ICD-10-CM

## 2015-05-11 DIAGNOSIS — R9389 Abnormal findings on diagnostic imaging of other specified body structures: Secondary | ICD-10-CM

## 2015-05-11 DIAGNOSIS — R911 Solitary pulmonary nodule: Secondary | ICD-10-CM | POA: Diagnosis not present

## 2015-05-11 MED ORDER — IOHEXOL 300 MG/ML  SOLN
75.0000 mL | Freq: Once | INTRAMUSCULAR | Status: AC | PRN
  Administered 2015-05-11: 75 mL via INTRAVENOUS

## 2015-05-11 NOTE — Telephone Encounter (Signed)
Results given to patient this morning. Her CBC looks better.  Will repeat in 1 months and this has been sent to [facility]. OV in 2 months and this was sent to [facility]

## 2015-05-11 NOTE — Telephone Encounter (Signed)
.  Per Lelon Perla patient will need to have lab work in 1 month.

## 2015-05-12 ENCOUNTER — Ambulatory Visit (INDEPENDENT_AMBULATORY_CARE_PROVIDER_SITE_OTHER): Payer: Medicare Other | Admitting: Family Medicine

## 2015-05-12 ENCOUNTER — Encounter: Payer: Self-pay | Admitting: Family Medicine

## 2015-05-12 VITALS — BP 118/71 | HR 67 | Temp 97.7°F | Resp 20 | Wt 201.8 lb

## 2015-05-12 DIAGNOSIS — Z7689 Persons encountering health services in other specified circumstances: Secondary | ICD-10-CM

## 2015-05-12 DIAGNOSIS — R918 Other nonspecific abnormal finding of lung field: Secondary | ICD-10-CM | POA: Insufficient documentation

## 2015-05-12 DIAGNOSIS — Z712 Person consulting for explanation of examination or test findings: Secondary | ICD-10-CM

## 2015-05-12 NOTE — Patient Instructions (Signed)
Good news, your Ct scan favor scar tissue in your lungs and not any illness. You do have 2 pulmonary nodules that are small in the right that we will need to follow up on next year by CT.  Pulmonary Nodule A pulmonary nodule is a small, round growth of tissue in the lung. Pulmonary nodules can range in size from less than 1/5 inch (4 mm) to a little bigger than an inch (25 mm). Most pulmonary nodules are detected when imaging tests of the lung are being performed for a different problem. Pulmonary nodules are usually not cancerous (benign). However, some pulmonary nodules are cancerous (malignant). Follow-up treatment or testing is based on the size of the pulmonary nodule and your risk of getting lung cancer.  CAUSES Benign pulmonary nodules can be caused by various things. Some of the causes include:   Bacterial, fungal, or viral infections. This is usually an old infection that is no longer active, but it can sometimes be a current, active infection.  A benign mass of tissue.  Inflammation from conditions such as rheumatoid arthritis.   Abnormal blood vessels in the lungs. Malignant pulmonary nodules can result from lung cancer or from cancers that spread to the lung from other places in the body. SIGNS AND SYMPTOMS Pulmonary nodules usually do not cause symptoms. DIAGNOSIS Most often, pulmonary nodules are found incidentally when an X-ray or CT scan is performed to look for some other problem in the lung area. To help determine whether a pulmonary nodule is benign or malignant, your health care provider will take a medical history and order a variety of tests. Tests done may include:   Blood tests.  A skin test called a tuberculin test. This test is used to determine if you have been exposed to the germ that causes tuberculosis.   Chest X-rays. If possible, a new X-ray may be compared with X-rays you have had in the past.   CT scan. This test shows smaller pulmonary nodules more  clearly than an X-ray.   Positron emission tomography (PET) scan. In this test, a safe amount of a radioactive substance is injected into the bloodstream. Then, the scan takes a picture of the pulmonary nodule. The radioactive substance is eliminated from your body in your urine.   Biopsy. A tiny piece of the pulmonary nodule is removed so it can be checked under a microscope. TREATMENT  Pulmonary nodules that are benign normally do not require any treatment because they usually do not cause symptoms or breathing problems. Your health care provider may want to monitor the pulmonary nodule through follow-up CT scans. The frequency of these CT scans will vary based on the size of the nodule and the risk factors for lung cancer. For example, CT scans will need to be done more frequently if the pulmonary nodule is larger and if you have a history of smoking and a family history of cancer. Further testing or biopsies may be done if any follow-up CT scan shows that the size of the pulmonary nodule has increased. HOME CARE INSTRUCTIONS  Only take over-the-counter or prescription medicines as directed by your health care provider.  Keep all follow-up appointments with your health care provider. SEEK MEDICAL CARE IF:  You have trouble breathing when you are active.   You feel sick or unusually tired.   You do not feel like eating.   You lose weight without trying to.   You develop chills or night sweats.  [place]  IF:  You cannot catch your breath, or you begin wheezing.   You cannot stop coughing.   You cough up blood.   You become dizzy or feel like you are going to pass out.   You have sudden chest pain.   You have a fever or persistent symptoms for more than 2-3 days.   You have a fever and your symptoms suddenly get worse. MAKE SURE YOU:  Understand these instructions.  Will watch your condition.  Will get help right away if you are not doing  well or get worse.   This information is not intended to replace advice given to you by your health care provider. Make sure you discuss any questions you have with your health care provider.   Document Released: 01/23/2009 Document Revised: 11/28/2012 Document Reviewed: 09/17/2012 Elsevier Interactive Patient Education [employer].

## 2015-05-12 NOTE — Progress Notes (Signed)
Patient ID: Debra Sanchez, female   DOB: Oct 12, 1940, 75 y.o.   MRN: [id number]   Subjective:    Patient ID: Debra Sanchez, female    DOB: [birth year], 75 y.o.   MRN: [id number]  HPI   CC: f/u on abnormal xray and CT results  Lung nodules: pt was seen for recent concern of PNA with positive CXR for PNA. Pt reports resolutions of symptoms and only mild fatigue remains. Repeat CXR was concerning for non-resolving pneumonia vs mass. CT scan was performed and favored more of a scar tissue formation more so than infection/mass. Patient also was noted to have a stable 53mm right lung nodule and a new 3 mm right lung nodule. Pt has never smoked, but had been exposed to second hand smoke. 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   Ct Chest W Contrast  05/11/2015  CLINICAL DATA:  Shortness of breath, cough. EXAM: CT CHEST WITH CONTRAST TECHNIQUE: Multidetector CT imaging of the chest was performed during intravenous contrast administration. CONTRAST:  19mL OMNIPAQUE IOHEXOL 300 MG/ML  SOLN COMPARISON:  Chest radiograph of May 07, 2015. CT scan of November 02, 2010. FINDINGS: No pneumothorax or pleural effusion is noted. There is noted a 14 x 10 mm irregular pleural-based density seen anteriorly in right middle lobe which is slightly enlarged compared to prior exam. Given the very small amount of change over 4 years, this most likely represents scarring. Stable 4 mm nodule is noted in right lung apex for which no follow-up is required. New 3 mm nodule is noted laterally in the right upper lobe best seen on image number 15 of series 3. Atherosclerosis of thoracic aorta is noted without definite aneurysm or dissection. No mediastinal mass or adenopathy is noted. Stable large hiatal hernia is noted. No abnormality is noted in the visualized upper abdomen. No significant osseous abnormality is noted. IMPRESSION: Stable large hiatal hernia. Irregular pleural-based density seen anteriorly in the right middle  lobe which appears to be slightly enlarged compared to prior exam, but most likely represents scarring given the very minimal change noted over 4 years. New 3 mm nodule is noted in the right upper lobe. If the patient is at high risk for bronchogenic carcinoma, follow-up chest CT at 1 year is recommended. If the patient is at low risk, no follow-up is needed. This recommendation follows the consensus statement: Guidelines for Management of Small Pulmonary Nodules Detected on CT Scans: A Statement from the [employer] as published in Radiology 2005; 237:395-400. Electronically Signed   By: Marijo Conception, M.D.   On: 05/11/2015 14:59    Past Medical History  Diagnosis Date  . Hypertension   . Anemia   . Anxiety   . Arthritis   . Blood transfusion   . Gastric ulcer   . Hypothyroidism   . Depression   . GERD (gastroesophageal reflux disease)   . Heart murmur   . Migraines   . Degenerative disc disease, lumbar   . Osteoporosis   . DDD (degenerative disc disease), thoracolumbar    Allergies  Allergen Reactions  . Codeine Itching  . Latex Other (See Comments)    fainted  . Morphine And Related Itching  . Sulfa Antibiotics Nausea And Vomiting   Past Surgical History  Procedure Laterality Date  . Abdominal hysterectomy    . Bilateral oophorectomy    . Neck surgery      cervical disc  . 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 Esophagogastroduodenoscopy  11/05/2010    Procedure: ESOPHAGOGASTRODUODENOSCOPY (EGD);  Surgeon: Rogene Houston, MD;  [facility];  Service: Endoscopy;  Laterality: N/A;  7:00 am per [facility]  . Colonoscopy  01/19/2011    Procedure: COLONOSCOPY;  Surgeon: Rogene Houston, MD;  [facility]: [facility];  Service: Endoscopy;  Laterality: N/A;  2:00  . Esophagogastroduodenoscopy N/A 07/25/2012    Procedure: ESOPHAGOGASTRODUODENOSCOPY (EGD);  Surgeon: Rogene Houston, MD;  [facility];  Service:  Endoscopy;  Laterality: N/A;  400-moved to 3 Ann notified pt  . Cataract extraction w/phaco Left 10/20/2014    Procedure: CATARACT EXTRACTION PHACO AND INTRAOCULAR LENS PLACEMENT LEFT EYE;  Surgeon: Tonny Branch, MD;  Location: [facility];  Service: Ophthalmology;  Laterality: Left;  CDE:6.98  . Cataract extraction w/phaco Right 11/17/2014    Procedure: CATARACT EXTRACTION PHACO AND INTRAOCULAR LENS PLACEMENT RIGHT EYE CDE=6.57;  Surgeon: Tonny Branch, MD;  Location: [facility];  Service: Ophthalmology;  Laterality: Right;  . Thoracic tumor     Family History  Problem Relation Age of Onset  . Colon cancer Mother   . Heart disease Mother   . Arthritis Mother   . Heart disease Father   . Arthritis Father   . Arthritis Sister   . Arthritis Brother   . Arthritis Maternal Aunt   . Arthritis Maternal Uncle   . Diabetes Maternal Uncle    Social History   Social History  . Marital Status: Married    Spouse Name: N/A  . Number of Children: N/A  . Years of Education: N/A   Occupational History  . Not on file.   Social History Main Topics  . Smoking status: Never Smoker   . Smokeless tobacco: Never Used  . Alcohol Use: No  . Drug Use: No  . Sexual Activity: No   Other Topics Concern  . Not on file   Social History Narrative   Married to [employer].   2 children Kerin Ransom and Anne Ng)    Retired, 2 yrs college.    Caffeine beverages.   Takes a daily vitamin.   Wears her seatbelt. Smoke detector in the home.    Feels safe in her relationships.                        Review of Systems Negative, with the exception of above mentioned in HPI      Objective:   Physical Exam BP 118/71 mmHg  Pulse 67  Temp(Src) 97.7 F (36.5 C)  Resp 20  Wt 201 lb 12 oz (91.513 kg)  SpO2 100% Gen: Afebrile. No acute distress. Nontoxic in appearance, well-developed, well-nourished, Caucasian female. Pleasant. HENT: AT. [place].  MMM, no oral lesions. No cough presently exam, no hoarseness present on  exam. Eyes:Pupils Equal Round Reactive to light, Extraocular movements intact,  Conjunctiva without redness, discharge or icterus. Neck/lymp/endocrine: Supple, mild anterior cervical lymphadenopathy Chest: CTAB, no wheeze or crackles. Normal respiratory effort. Moderate air movement.     Assessment & Plan:  Debra Sanchez is a 75 y.o. female present for follow up on PNA and abnormal rpt CXR, and CT scan reassuring with favoring scar tissue today.  Pulmonary nodules - reviewed reports and images of all cxr and CT scan in detail with pt today.  - Reassured pt area of concern appears to be scar.  - discussed both the stable nodule and new nodule  in her right long and need to follow yearly.  - Pt was given the opportunity to ask questions, all questions answered. Pt appreciative of explanations and understood plan.

## 2015-05-20 ENCOUNTER — Telehealth (INDEPENDENT_AMBULATORY_CARE_PROVIDER_SITE_OTHER): Payer: Self-pay | Admitting: *Deleted

## 2015-05-20 NOTE — Telephone Encounter (Signed)
Patient has called 2 days in a row. A spot was seen on her lung. A CT was done and that was found to be scar tissue. However , it was found that she had a very large Hiatal Hernia. She is having trouble eating as it is very painful. She has an appointment with Terri for 07/14/15 , and she is to have repeat lab the end of February. Patient has a history o fbleeding ulcers.  She ask if Dr. Dorien Chihuahua would review CT , and advise his recommendations.  Per Dr.Rehman- reviewed the CT and is in agreement that is is very large  He would like to precede with a EGD.After this he will make further recommendations.  Forwarded to [place] to arrange. Patient has not been called back at this time.  Home # [phone] Work # [phone]

## 2015-05-20 NOTE — Telephone Encounter (Signed)
Left message asking patient to call me.

## 2015-05-21 ENCOUNTER — Other Ambulatory Visit (INDEPENDENT_AMBULATORY_CARE_PROVIDER_SITE_OTHER): Payer: Self-pay | Admitting: Internal Medicine

## 2015-05-21 DIAGNOSIS — K449 Diaphragmatic hernia without obstruction or gangrene: Secondary | ICD-10-CM

## 2015-05-21 DIAGNOSIS — K921 Melena: Secondary | ICD-10-CM

## 2015-05-21 NOTE — Telephone Encounter (Signed)
EGD sch'd 05/27/15, patient aware

## 2015-05-27 ENCOUNTER — Other Ambulatory Visit (INDEPENDENT_AMBULATORY_CARE_PROVIDER_SITE_OTHER): Payer: Self-pay | Admitting: Internal Medicine

## 2015-05-27 ENCOUNTER — Encounter (HOSPITAL_COMMUNITY)
Admission: RE | Disposition: A | Discharge status: Home / Self Care | Payer: Self-pay | Source: Ambulatory Visit | Attending: Internal Medicine

## 2015-05-27 ENCOUNTER — Ambulatory Visit (HOSPITAL_COMMUNITY)
Admission: RE | Admit: 2015-05-27 | Discharge: 2015-05-27 | Disposition: A | Discharge status: Home / Self Care | Payer: Medicare Other | Source: Ambulatory Visit | Attending: Internal Medicine | Admitting: Internal Medicine

## 2015-05-27 ENCOUNTER — Encounter (HOSPITAL_COMMUNITY): Payer: Self-pay | Admitting: *Deleted

## 2015-05-27 DIAGNOSIS — Z79899 Other long term (current) drug therapy: Secondary | ICD-10-CM | POA: Diagnosis not present

## 2015-05-27 DIAGNOSIS — F419 Anxiety disorder, unspecified: Secondary | ICD-10-CM | POA: Diagnosis not present

## 2015-05-27 DIAGNOSIS — R1013 Epigastric pain: Principal | ICD-10-CM

## 2015-05-27 DIAGNOSIS — Z7951 Long term (current) use of inhaled steroids: Secondary | ICD-10-CM | POA: Insufficient documentation

## 2015-05-27 DIAGNOSIS — K219 Gastro-esophageal reflux disease without esophagitis: Secondary | ICD-10-CM | POA: Insufficient documentation

## 2015-05-27 DIAGNOSIS — Z800 Family history of malignant neoplasm of digestive organs: Secondary | ICD-10-CM | POA: Diagnosis not present

## 2015-05-27 DIAGNOSIS — K921 Melena: Secondary | ICD-10-CM | POA: Diagnosis not present

## 2015-05-27 DIAGNOSIS — M1991 Primary osteoarthritis, unspecified site: Secondary | ICD-10-CM | POA: Diagnosis not present

## 2015-05-27 DIAGNOSIS — F329 Major depressive disorder, single episode, unspecified: Secondary | ICD-10-CM | POA: Diagnosis not present

## 2015-05-27 DIAGNOSIS — I10 Essential (primary) hypertension: Secondary | ICD-10-CM | POA: Insufficient documentation

## 2015-05-27 DIAGNOSIS — K449 Diaphragmatic hernia without obstruction or gangrene: Secondary | ICD-10-CM | POA: Insufficient documentation

## 2015-05-27 DIAGNOSIS — E039 Hypothyroidism, unspecified: Secondary | ICD-10-CM | POA: Diagnosis not present

## 2015-05-27 DIAGNOSIS — K469 Unspecified abdominal hernia without obstruction or gangrene: Secondary | ICD-10-CM | POA: Diagnosis not present

## 2015-05-27 DIAGNOSIS — G8929 Other chronic pain: Secondary | ICD-10-CM

## 2015-05-27 HISTORY — DX: Personal history of other diseases of the digestive system: Z87.19

## 2015-05-27 HISTORY — PX: ESOPHAGOGASTRODUODENOSCOPY: SHX5428

## 2015-05-27 LAB — HEMOGLOBIN AND HEMATOCRIT, BLOOD
HCT: 28.5 % — ABNORMAL LOW (ref 36.0–46.0)
Hemoglobin: 8.9 g/dL — ABNORMAL LOW (ref 12.0–15.0)

## 2015-05-27 SURGERY — EGD (ESOPHAGOGASTRODUODENOSCOPY)
Anesthesia: Moderate Sedation

## 2015-05-27 MED ORDER — MEPERIDINE HCL 50 MG/ML IJ SOLN
INTRAMUSCULAR | Status: DC
Start: 2015-05-27 — End: 2015-05-27
  Filled 2015-05-27: qty 1

## 2015-05-27 MED ORDER — MIDAZOLAM HCL 5 MG/5ML IJ SOLN
INTRAMUSCULAR | Status: DC | PRN
  Administered 2015-05-27 (×2): 2 mg via INTRAVENOUS
  Administered 2015-05-27: 1 mg via INTRAVENOUS

## 2015-05-27 MED ORDER — MEPERIDINE HCL 50 MG/ML IJ SOLN
INTRAMUSCULAR | Status: DC | PRN
  Administered 2015-05-27 (×2): 25 mg via INTRAVENOUS

## 2015-05-27 MED ORDER — MIDAZOLAM HCL 5 MG/5ML IJ SOLN
INTRAMUSCULAR | Status: AC
  Filled 2015-05-27: qty 10

## 2015-05-27 MED ORDER — STERILE WATER FOR IRRIGATION IR SOLN
Status: DC | PRN
  Administered 2015-05-27: 14:00:00

## 2015-05-27 MED ORDER — SODIUM CHLORIDE 0.9 % IV SOLN
INTRAVENOUS | Status: DC
  Administered 2015-05-27: 1000 mL via INTRAVENOUS

## 2015-05-27 MED ORDER — BUTAMBEN-TETRACAINE-BENZOCAINE 2-2-14 % EX AERO
INHALATION_SPRAY | CUTANEOUS | Status: DC | PRN
  Administered 2015-05-27: 2 via TOPICAL

## 2015-05-27 NOTE — Discharge Instructions (Signed)
Resume usual medications and diet.   No driving for 24 hours.  Upper GI series to be scheduled.  H&H will be checked today.       Esophagogastroduodenoscopy, Care After Refer to this sheet in the next few weeks. These instructions provide you with information about caring for yourself after your procedure. Your health care provider may also give you more specific instructions. Your treatment has been planned according to current medical practices, but problems sometimes occur. Call your health care provider if you have any problems or questions after your procedure. WHAT TO EXPECT AFTER THE PROCEDURE After your procedure, it is typical to feel:  Soreness in your throat.  Pain with swallowing.  Sick to your stomach (nauseous).  Bloated.  Dizzy.  Fatigued. HOME CARE INSTRUCTIONS  Do not eat or drink anything until the numbing medicine (local anesthetic) has worn off and your gag reflex has returned. You will know that the local anesthetic has worn off when you can swallow comfortably.  Do not drive or operate machinery until directed by your health care provider.  Take medicines only as directed by your health care provider. SEEK MEDICAL CARE IF:   You cannot stop coughing.  You are not urinating at all or less than usual. SEEK IMMEDIATE MEDICAL CARE IF:  You have difficulty swallowing.  You cannot eat or drink.  You have worsening throat or chest pain.  You have dizziness or lightheadedness or you faint.  You have nausea or vomiting.  You have chills.  You have a fever.  You have severe abdominal pain.  You have black, tarry, or bloody stools.   This information is not intended to replace advice given to you by your health care provider. Make sure you discuss any questions you have with your health care provider.   Document Released: 03/14/2012 Document Revised: 04/18/2014 Document Reviewed: 03/14/2012 Elsevier Interactive Patient Education [employer].

## 2015-05-27 NOTE — Op Note (Signed)
EGD PROCEDURE REPORT  PATIENT:  Debra Sanchez  MR#:  [id number] Birthdate:  [birth year], 75 y.o., female Endoscopist:  Dr. Rogene Houston, MD Referred By:  Dr.  Debroah BallerRaoul Pitch, [place]. Procedure Date: 05/27/2015  Procedure:   EGD  Indications:   Patient is 75 year old Caucasian female with chronic GERD and known large sliding hiatal hernia who presents with melena and epigastric pain.  Her stool was guaiac negative and she was seen in the office on 04/29/2015. Her hemoglobin was 9.1 on 05/08/2015.            Informed Consent:  The risks, benefits, alternatives & imponderables which include, but are not limited to, bleeding, infection, perforation, drug reaction and potential missed lesion have been reviewed.  The potential for biopsy, lesion removal, esophageal dilation, etc. have also been discussed.  Questions have been answered.  All parties agreeable.  Please see history & physical in medical record for more information.  Medications:  Demerol 50 mg IV Versed 5 mg IV Cetacaine spray topically for oropharyngeal anesthesia  First dose administered at 1414 Last dose administered at 1418  Description of procedure:  The endoscope was introduced through the mouth and advanced to the second portion of the duodenum without difficulty or limitations. The mucosal surfaces were surveyed very carefully during advancement of the scope and upon withdrawal.  Findings:  Esophagus:   Mucosa was normal throughout. GE junction unremarkable. GEJ:  33cm Hiatus:  41 cm Stomach:   Difficulty encountered in advancing the scope from herniated part of the stomach into antrum. This was accomplished by using pressure across upper abdomen. Mucosa of the herniated part of the stomach was normal. Mucosa at gastric body antrum pyloric channel was also unremarkable. Duodenum:   Normal bulbar and post bulbar mucosa.  Therapeutic/Diagnostic Maneuvers Performed:   none  Complications:   None  EBL:  none  Impression:  No evidence of erosive esophagitis.  Large sliding hiatal hernia with organoaxial rotation.  No gastritis or evidence of peptic ulcer disease.  Comment;  No lesion identified to account for patient's melena which has cleared. It is possible the bleeding was secondary to NSAID-induced injury. She did take ibuprofen for 4 days in a row.  She will benefit from anti-reflex surgery as she is having frequent postprandial pain.  Recommendations:   Standard instructions given.  Will check H&H today.  Upper GI series.  Sayf Kerner U  05/27/2015  2:31 PM  CC: Dr. Howard Pouch, DO & Dr. No ref. provider found

## 2015-05-27 NOTE — H&P (Addendum)
Debra Sanchez is an 75 y.o. female.   Chief Complaint:    Debra Sanchez is here for EGD. HPI:  Debra Sanchez is 75 year old Caucasian female who was chronic GERD and normal large hiatal hernia as well as his GFR per GI bleed in 2012 and 2014 who presents with 3-4 day history of melena anemia. She also complains of frequent burping and postprandial epigastric pain. She has not lost any weight. She ibuprofen occasionally.  Past Medical History  Diagnosis Date  . Hypertension   . Anemia   . Anxiety   . Arthritis   . Blood transfusion   . Gastric ulcer   . Hypothyroidism   . Depression   . GERD (gastroesophageal reflux disease)   . Heart murmur   . Migraines   . Degenerative disc disease, lumbar   . Osteoporosis   . DDD (degenerative disc disease), thoracolumbar   . History of hiatal hernia     Past Surgical History  Procedure Laterality Date  . Abdominal hysterectomy    . Bilateral oophorectomy    . Neck surgery      cervical disc  . 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 Esophagogastroduodenoscopy  11/05/2010    Procedure: ESOPHAGOGASTRODUODENOSCOPY (EGD);  Surgeon: Rogene Houston, MD;  [facility];  Service: Endoscopy;  Laterality: N/A;  7:00 am per [facility]  . Colonoscopy  01/19/2011    Procedure: COLONOSCOPY;  Surgeon: Rogene Houston, MD;  [facility];  Service: Endoscopy;  Laterality: N/A;  2:00  . Esophagogastroduodenoscopy N/A 07/25/2012    Procedure: ESOPHAGOGASTRODUODENOSCOPY (EGD);  Surgeon: Rogene Houston, MD;  [facility];  Service: Endoscopy;  Laterality: N/A;  400-moved to 34 Ann notified pt  . Cataract extraction w/phaco Left 10/20/2014    Procedure: CATARACT EXTRACTION PHACO AND INTRAOCULAR LENS PLACEMENT LEFT EYE;  Surgeon: Tonny Branch, MD;  Location: [facility];  Service: Ophthalmology;  Laterality: Left;  CDE:6.98  . Cataract extraction w/phaco Right 11/17/2014    Procedure: CATARACT EXTRACTION PHACO  AND INTRAOCULAR LENS PLACEMENT RIGHT EYE CDE=6.57;  Surgeon: Tonny Branch, MD;  Location: [facility];  Service: Ophthalmology;  Laterality: Right;  . Thoracic tumor      Family History  Problem Relation Age of Onset  . Colon cancer Mother   . Heart disease Mother   . Arthritis Mother   . Heart disease Father   . Arthritis Father   . Arthritis Sister   . Arthritis Brother   . Arthritis Maternal Aunt   . Arthritis Maternal Uncle   . Diabetes Maternal Uncle    Social History:  reports that she has never smoked. She has never used smokeless tobacco. She reports that she does not drink alcohol or use illicit drugs.  Allergies:  Allergies  Allergen Reactions  . Codeine Itching  . Latex Other (See Comments)    fainted  . Morphine And Related Itching  . Sulfa Antibiotics Nausea And Vomiting    Medications Prior to Admission  Medication Sig Dispense Refill  . amLODipine (NORVASC) 2.5 MG tablet Take 2.5 mg by mouth daily.    Marland Kitchen CALCIUM PO Take 1 tablet by mouth daily.    . Cholecalciferol (VITAMIN D PO) Take 1 tablet by mouth daily.    . Cyanocobalamin (B-12 PO) Take 1 tablet by mouth.    . fluticasone (FLONASE) 50 MCG/ACT nasal spray Place 2 sprays into both nostrils daily. 16 g 6  .  hydrochlorothiazide (MICROZIDE) 12.5 MG capsule Take 1 capsule (12.5 mg total) by mouth daily as needed (swelling). 90 capsule 0  . IRON PO Take 325 tablets by mouth daily.     Marland Kitchen levothyroxine (SYNTHROID, LEVOTHROID) 50 MCG tablet Take 50 mcg by mouth daily.    Marland Kitchen losartan (COZAAR) 100 MG tablet TAKE (1) TABLET BY MOUTH ONCE DAILY. 30 tablet 6  . MULTIPLE VITAMINS PO Take 1 tablet by mouth daily.     . Omega-3 Fatty Acids (FISH OIL PO) Take 1 tablet by mouth daily.    . pantoprazole (PROTONIX) 40 MG tablet     . sertraline (ZOLOFT) 100 MG tablet Take 50 mg by mouth daily.     . SUMAtriptan (IMITREX) 100 MG tablet Take 100 mg by mouth every 2 (two) hours as needed for migraine. May repeat in 2 hours if headache  persists or recurs.    . traMADol (ULTRAM) 50 MG tablet Take 50 mg by mouth every 6 (six) hours as needed for moderate pain.    . traZODone (DESYREL) 50 MG tablet Take 0.5-1 tablets (25-50 mg total) by mouth at bedtime as needed for sleep. 60 tablet 3    No results found for this or any previous visit (from the past 48 hour(s)). No results found.  ROS  Blood pressure 139/73, pulse 72, temperature 99 F (37.2 C), temperature source Oral, resp. rate 15, height 5\' 7"  (1.702 m), weight 200 lb (90.719 kg), SpO2 99 %. Physical Exam  Constitutional: She appears well-developed and well-nourished.  HENT:  Mouth/Throat: Oropharynx is clear and moist.  Eyes: Conjunctivae are normal. No scleral icterus.  Neck: No thyromegaly present.  Cardiovascular: Normal rate, regular rhythm and normal heart sounds.   No murmur heard. Respiratory: Effort normal and breath sounds normal.  GI: Soft. She exhibits no distension and no mass. There is no tenderness.  Musculoskeletal: She exhibits no edema.  Lymphadenopathy:    She has no cervical adenopathy.  Neurological: She is alert.  Skin: Skin is warm and dry.     Assessment/Plan  Melena anemia and epigastric pain.  Diagnostic EGD.   Rogene Houston, MD 05/27/2015, 2:08 PM

## 2015-05-29 ENCOUNTER — Encounter (HOSPITAL_COMMUNITY): Payer: Self-pay | Admitting: Internal Medicine

## 2015-06-01 ENCOUNTER — Telehealth (INDEPENDENT_AMBULATORY_CARE_PROVIDER_SITE_OTHER): Payer: Self-pay | Admitting: *Deleted

## 2015-06-01 ENCOUNTER — Other Ambulatory Visit (HOSPITAL_COMMUNITY): Payer: Medicare Other

## 2015-06-01 DIAGNOSIS — K921 Melena: Secondary | ICD-10-CM

## 2015-06-01 NOTE — Telephone Encounter (Signed)
Per Dr.Rehman the patient will need to have labs drawn on 03/04/2016.

## 2015-06-04 ENCOUNTER — Ambulatory Visit (HOSPITAL_COMMUNITY)
Admission: RE | Admit: 2015-06-04 | Discharge: 2015-06-04 | Disposition: A | Discharge status: Home / Self Care | Payer: Medicare Other | Source: Ambulatory Visit | Attending: Internal Medicine | Admitting: Internal Medicine

## 2015-06-04 DIAGNOSIS — R1013 Epigastric pain: Secondary | ICD-10-CM | POA: Insufficient documentation

## 2015-06-04 DIAGNOSIS — K449 Diaphragmatic hernia without obstruction or gangrene: Secondary | ICD-10-CM | POA: Insufficient documentation

## 2015-06-04 DIAGNOSIS — G8929 Other chronic pain: Secondary | ICD-10-CM

## 2015-06-05 ENCOUNTER — Telehealth (INDEPENDENT_AMBULATORY_CARE_PROVIDER_SITE_OTHER): Payer: Self-pay | Admitting: *Deleted

## 2015-06-05 DIAGNOSIS — D508 Other iron deficiency anemias: Secondary | ICD-10-CM | POA: Diagnosis not present

## 2015-06-05 DIAGNOSIS — K279 Peptic ulcer, site unspecified, unspecified as acute or chronic, without hemorrhage or perforation: Secondary | ICD-10-CM | POA: Diagnosis not present

## 2015-06-05 LAB — HEMOGLOBIN AND HEMATOCRIT, BLOOD
HCT: 34.6 % — ABNORMAL LOW (ref 36.0–46.0)
Hemoglobin: 10.6 g/dL — ABNORMAL LOW (ref 12.0–15.0)

## 2015-06-05 NOTE — Telephone Encounter (Signed)
Per Dr.Rehman the patient will need to have labs drawn today ,06/05/2015.

## 2015-06-08 ENCOUNTER — Encounter (INDEPENDENT_AMBULATORY_CARE_PROVIDER_SITE_OTHER): Payer: Self-pay | Admitting: *Deleted

## 2015-06-08 ENCOUNTER — Telehealth (INDEPENDENT_AMBULATORY_CARE_PROVIDER_SITE_OTHER): Payer: Self-pay | Admitting: Internal Medicine

## 2015-06-08 ENCOUNTER — Other Ambulatory Visit (INDEPENDENT_AMBULATORY_CARE_PROVIDER_SITE_OTHER): Payer: Self-pay | Admitting: *Deleted

## 2015-06-08 ENCOUNTER — Telehealth (INDEPENDENT_AMBULATORY_CARE_PROVIDER_SITE_OTHER): Payer: Self-pay | Admitting: *Deleted

## 2015-06-08 DIAGNOSIS — K279 Peptic ulcer, site unspecified, unspecified as acute or chronic, without hemorrhage or perforation: Secondary | ICD-10-CM

## 2015-06-08 DIAGNOSIS — K921 Melena: Secondary | ICD-10-CM

## 2015-06-08 DIAGNOSIS — D508 Other iron deficiency anemias: Secondary | ICD-10-CM

## 2015-06-08 NOTE — Telephone Encounter (Signed)
Per Dr.Rehman the patient will need to have labs drawn in 2 weeks. 

## 2015-06-08 NOTE — Telephone Encounter (Signed)
Patient has been called . It may be tomorrow before Dr.Rehman will address this, she was made aware.

## 2015-06-08 NOTE — Telephone Encounter (Signed)
Ms. Blong left a message saying she's spoken to Tammy about Dr. Laural Golden seeing her brother as a patient and she'd like a phone call regarding this.   Pt's ph# [phone]  Thank you.

## 2015-06-22 DIAGNOSIS — K921 Melena: Secondary | ICD-10-CM | POA: Diagnosis not present

## 2015-06-22 DIAGNOSIS — D508 Other iron deficiency anemias: Secondary | ICD-10-CM | POA: Diagnosis not present

## 2015-06-22 DIAGNOSIS — K279 Peptic ulcer, site unspecified, unspecified as acute or chronic, without hemorrhage or perforation: Secondary | ICD-10-CM | POA: Diagnosis not present

## 2015-06-22 LAB — HEMOGLOBIN AND HEMATOCRIT, BLOOD
HCT: 36.6 % (ref 36.0–46.0)
Hemoglobin: 11.3 g/dL — ABNORMAL LOW (ref 12.0–15.0)

## 2015-06-24 ENCOUNTER — Telehealth (INDEPENDENT_AMBULATORY_CARE_PROVIDER_SITE_OTHER): Payer: Self-pay | Admitting: *Deleted

## 2015-06-24 DIAGNOSIS — K921 Melena: Secondary | ICD-10-CM

## 2015-06-24 DIAGNOSIS — K279 Peptic ulcer, site unspecified, unspecified as acute or chronic, without hemorrhage or perforation: Secondary | ICD-10-CM

## 2015-06-24 NOTE — Telephone Encounter (Signed)
Per Dr.Rehman the patient will need to have labs drawn in 8 weeks. 

## 2015-06-29 ENCOUNTER — Telehealth (INDEPENDENT_AMBULATORY_CARE_PROVIDER_SITE_OTHER): Payer: Self-pay | Admitting: *Deleted

## 2015-06-29 NOTE — Telephone Encounter (Signed)
Patient left message for Tammy to call her

## 2015-06-30 NOTE — Telephone Encounter (Signed)
Patient was called, message was left for her to call the office back.

## 2015-07-03 ENCOUNTER — Other Ambulatory Visit (INDEPENDENT_AMBULATORY_CARE_PROVIDER_SITE_OTHER): Payer: Self-pay | Admitting: Internal Medicine

## 2015-07-14 ENCOUNTER — Ambulatory Visit (INDEPENDENT_AMBULATORY_CARE_PROVIDER_SITE_OTHER): Payer: Medicare Other | Admitting: Internal Medicine

## 2015-07-16 ENCOUNTER — Ambulatory Visit: Payer: Self-pay | Admitting: Surgery

## 2015-07-16 DIAGNOSIS — K449 Diaphragmatic hernia without obstruction or gangrene: Secondary | ICD-10-CM | POA: Diagnosis not present

## 2015-07-16 NOTE — H&P (Signed)
Debra Sanchez 07/16/2015 10:42 AM Location: [facility] Patient #: [id number] DOB: 1940/09/23 Married / Language: English / Race: White Female  History of Present Illness [name] Done MD; 07/16/2015 11:43 AM) Patient words: Large type III mixed hiatal hernia  This 75 year old white female comes down from reasonable where she has been evaluated by Dr. Sophiya Morello Saras. She's had at least 2 episodes of significant bleeding one back in 2012 and then recently where she bled down to a hemoglobin of around 8. She has had have transfusions in the past. She also has problems with chest tightness and discomfort occasionally.  Upper GI series reveals a very large type III mixed type hernia with about 70% of her stomach in her chest. I reviewed this in Epic. I then discussed laparoscopic repair with her husband in some detail and gave him a booklet on this drawing pictures of how would repair this. Also indicated some as we have to tack the stomach in the abdomen without actually doing the wrap but would try to do a Nissen fundoplication. Since she had not had reflux in a number of years. She does take a PPI.  She has an important court date in early May and she cannot miss that. She would like to schedule this after May 9. We'll go ahead and schedule for laparoscopic repair of large type III mixed type hernia with Nissen fundoplication.  The patient is a 75 year old female.   Other Problems [name], CMA; 07/16/2015 10:47 AM) Arthritis Gastric Ulcer Gastroesophageal Reflux Disease High blood pressure Thyroid Disease  Past Surgical History [name], CMA; 07/16/2015 10:47 AM) Spinal Surgery - Neck Spinal Surgery Midback  Allergies ([name], CMA; 07/16/2015 10:49 AM) Codeine Phosphate *ANALGESICS - OPIOID* Latex Exam Gloves *MEDICAL DEVICES AND SUPPLIES* Morphine Sulfate (Concentrate) *ANALGESICS - OPIOID* Sulfa Antibiotics  Medication History ([name], CMA;  07/16/2015 10:49 AM) AmLODIPine Besylate (2.5MG  Tablet, Oral) Active. Fluticasone Propionate (50MCG/ACT Suspension, Nasal) Active. Losartan Potassium (100MG  Tablet, Oral) Active. Levothyroxine Sodium (50MCG Tablet, Oral) Active. Sertraline HCl (100MG  Tablet, Oral) Active. Pantoprazole Sodium (40MG  Tablet DR, Oral) Active. TraZODone HCl (50MG  Tablet, Oral) Active. Medications Reconciled  Pregnancy / Birth History [name], CMA; 07/16/2015 10:47 AM) Maternal age 41-20 Para 2    Vitals ([place]; 07/16/2015 10:48 AM) 07/16/2015 10:48 AM Height: 68in Pulse: 62 (Regular)  BP: 126/80 (Sitting, Left Arm, Standard)      Physical Exam (Jacoria Keiffer B. Hassell Done MD; 07/16/2015 11:45 AM)  The physical exam findings are as follows: Note:HEENT negative Neck-no masses (hypothyroid) Chest clear Heart SR without murmurs Abdomen nontender Ext FROM with no history of DVT Neuro alert and oriented x 3    Assessment & Plan [name] Done MD; 07/16/2015 11:46 AM)  HIATAL HERNIA (K44.9) Impression: Will schedule repair of large type III mixed hiatal hernia

## 2015-07-20 ENCOUNTER — Encounter: Payer: Self-pay | Admitting: Family Medicine

## 2015-07-20 DIAGNOSIS — K449 Diaphragmatic hernia without obstruction or gangrene: Secondary | ICD-10-CM | POA: Insufficient documentation

## 2015-08-05 ENCOUNTER — Encounter (INDEPENDENT_AMBULATORY_CARE_PROVIDER_SITE_OTHER): Payer: Self-pay | Admitting: *Deleted

## 2015-08-05 ENCOUNTER — Other Ambulatory Visit (INDEPENDENT_AMBULATORY_CARE_PROVIDER_SITE_OTHER): Payer: Self-pay | Admitting: *Deleted

## 2015-08-05 DIAGNOSIS — K279 Peptic ulcer, site unspecified, unspecified as acute or chronic, without hemorrhage or perforation: Secondary | ICD-10-CM

## 2015-08-05 DIAGNOSIS — K921 Melena: Secondary | ICD-10-CM

## 2015-08-11 ENCOUNTER — Telehealth (INDEPENDENT_AMBULATORY_CARE_PROVIDER_SITE_OTHER): Payer: Self-pay | Admitting: Internal Medicine

## 2015-08-11 NOTE — Telephone Encounter (Signed)
Patient called and stated that she is scheduled for labs on 08/19/15, but she will not be able to get these done.  She stated that she'll be at the hospital having labs done, she is having surgery 08/25/15  I returned the patients call to let her know we got the message and to have all labs forwarded to our office.

## 2015-08-20 ENCOUNTER — Encounter (HOSPITAL_COMMUNITY)
Admission: RE | Admit: 2015-08-20 | Discharge: 2015-08-20 | Disposition: A | Discharge status: Home / Self Care | Payer: Medicare Other | Source: Ambulatory Visit | Attending: Surgery | Admitting: Surgery

## 2015-08-20 ENCOUNTER — Encounter (INDEPENDENT_AMBULATORY_CARE_PROVIDER_SITE_OTHER): Payer: Self-pay

## 2015-08-20 ENCOUNTER — Encounter (HOSPITAL_COMMUNITY): Payer: Self-pay

## 2015-08-20 DIAGNOSIS — R0789 Other chest pain: Secondary | ICD-10-CM | POA: Insufficient documentation

## 2015-08-20 DIAGNOSIS — R9431 Abnormal electrocardiogram [ECG] [EKG]: Secondary | ICD-10-CM | POA: Diagnosis not present

## 2015-08-20 DIAGNOSIS — I440 Atrioventricular block, first degree: Secondary | ICD-10-CM | POA: Diagnosis not present

## 2015-08-20 DIAGNOSIS — Z01812 Encounter for preprocedural laboratory examination: Secondary | ICD-10-CM | POA: Insufficient documentation

## 2015-08-20 DIAGNOSIS — Z01810 Encounter for preprocedural cardiovascular examination: Secondary | ICD-10-CM | POA: Diagnosis not present

## 2015-08-20 DIAGNOSIS — K274 Chronic or unspecified peptic ulcer, site unspecified, with hemorrhage: Secondary | ICD-10-CM | POA: Diagnosis not present

## 2015-08-20 HISTORY — DX: Syncope and collapse: R55

## 2015-08-20 HISTORY — DX: Presence of spectacles and contact lenses: Z97.3

## 2015-08-20 HISTORY — DX: Polyp of colon: K63.5

## 2015-08-20 HISTORY — DX: Family history of other specified conditions: Z84.89

## 2015-08-20 HISTORY — DX: Complete or unspecified spontaneous abortion without complication: O03.9

## 2015-08-20 LAB — CBC
HCT: 37.3 % (ref 36.0–46.0)
Hemoglobin: 12.1 g/dL (ref 12.0–15.0)
MCH: 27.5 pg (ref 26.0–34.0)
MCHC: 32.4 g/dL (ref 30.0–36.0)
MCV: 84.8 fL (ref 78.0–100.0)
Platelets: 208 10*3/uL (ref 150–400)
RBC: 4.4 MIL/uL (ref 3.87–5.11)
RDW: 16.4 % — ABNORMAL HIGH (ref 11.5–15.5)
WBC: 4.7 10*3/uL (ref 4.0–10.5)

## 2015-08-20 LAB — BASIC METABOLIC PANEL
Anion gap: 8 (ref 5–15)
BUN: 12 mg/dL (ref 6–20)
CO2: 27 mmol/L (ref 22–32)
Calcium: 9.1 mg/dL (ref 8.9–10.3)
Chloride: 108 mmol/L (ref 101–111)
Creatinine, Ser: 0.83 mg/dL (ref 0.44–1.00)
GFR calc Af Amer: >60 mL/min (ref >60)
GFR calc non Af Amer: >60 mL/min (ref >60)
Glucose, Bld: 92 mg/dL (ref 65–99)
Potassium: 4.4 mmol/L (ref 3.5–5.1)
Sodium: 143 mmol/L (ref 135–145)

## 2015-08-20 NOTE — Patient Instructions (Signed)
Debra Sanchez  08/20/2015   Your procedure is scheduled on: Tuesday Aug 25, 2015  Report to [facility] Main  Entrance take [place]  elevators to 3rd floor to  [place] at 8:15 AM.  Call this number if you have problems the morning of surgery [phone]   Remember: ONLY 1 PERSON MAY GO WITH YOU TO SHORT STAY TO GET  READY MORNING OF [place].  Do not eat food or drink liquids :After Midnight.     Take these medicines the morning of surgery with A SIP OF WATER: Amlodipine (Norvasc); May use Flonase Nasal Spray if needed (bring bottle with you day of surgery); Levothyroxine; May use eye drops if needed (bring bottle with you day of surgery); Pantoprazole (Protonix)                               You may not have any metal on your body including hair pins and              piercings  Do not wear jewelry, make-up, lotions, powders or perfumes, deodorant             Do not wear nail polish.  Do not shave  48 hours prior to surgery.               Do not bring valuables to the hospital. [place].  Contacts, dentures or bridgework may not be worn into surgery.  Leave suitcase in the car. After surgery it may be brought to your room.   _____________________________________________________________________             [facility] - Preparing for Surgery Before surgery, you can play an important role.  Because skin is not sterile, your skin needs to be as free of germs as possible.  You can reduce the number of germs on your skin by washing with CHG (chlorahexidine gluconate) soap before surgery.  CHG is an antiseptic cleaner which kills germs and bonds with the skin to continue killing germs even after washing. Please DO NOT use if you have an allergy to CHG or antibacterial soaps.  If your skin becomes reddened/irritated stop using the CHG and inform your nurse when you arrive at Short Stay. Do not shave  (including legs and underarms) for at least 48 hours prior to the first CHG shower.  You may shave your face/neck. Please follow these instructions carefully:  1.  Shower with CHG Soap the night before surgery and the  morning of Surgery.  2.  If you choose to wash your hair, wash your hair first as usual with your  normal  shampoo.  3.  After you shampoo, rinse your hair and body thoroughly to remove the  shampoo.                           4.  Use CHG as you would any other liquid soap.  You can apply chg directly  to the skin and wash                       Gently with a scrungie or clean washcloth.  5.  Apply the CHG  Soap to your body ONLY FROM THE NECK DOWN.   Do not use on face/ open                           Wound or open sores. Avoid contact with eyes, ears mouth and genitals (private parts).                       Wash face,  Genitals (private parts) with your normal soap.             6.  Wash thoroughly, paying special attention to the area where your surgery  will be performed.  7.  Thoroughly rinse your body with warm water from the neck down.  8.  DO NOT shower/wash with your normal soap after using and rinsing off  the CHG Soap.                9.  Pat yourself dry with a clean towel.            10.  Wear clean pajamas.            11.  Place clean sheets on your bed the night of your first shower and do not  sleep with pets. Day of Surgery : Do not apply any lotions/deodorants the morning of surgery.  Please wear clean clothes to the hospital/surgery center.  FAILURE TO FOLLOW THESE INSTRUCTIONS MAY RESULT IN THE CANCELLATION OF YOUR SURGERY PATIENT SIGNATURE_________________________________  NURSE SIGNATURE__________________________________  ________________________________________________________________________

## 2015-08-21 NOTE — Progress Notes (Signed)
Dr Linna Caprice / anesthesia reviewed pts EKGs/epic from 08/20/2015 and 02/03/2014. Also reviewed H&P. No orders given. Anesthesia to see pt day of surgery.

## 2015-08-23 NOTE — H&P (Signed)
Debra Sanchez 07/16/2015 10:42 AM Location: [name] #: [id number] DOB: [birth year] Married / Language: English / Race: White Female   History of Present Illness [name] Done MD; 07/16/2015 11:43 AM) Patient words: Large type III mixed hiatal hernia  This 75 year old white female comes down from reasonable where she has been evaluated by [name]. She's had at least 2 episodes of significant bleeding one back in 2012 and then recently where she bled down to a hemoglobin of around 8. She has had have transfusions in the past. She also has problems with chest tightness and discomfort occasionally.  Upper GI series reveals a very large type III mixed type hernia with about 70% of her stomach in her chest. I reviewed this in Epic. I then discussed laparoscopic repair with her husband in some detail and gave him a booklet on this drawing pictures of how would repair this. Also indicated some as we have to tack the stomach in the abdomen without actually doing the wrap but would try to do a Nissen fundoplication. Since she had not had reflux in a number of years. She does take a PPI.  She has an important court date in early May and she cannot miss that. She would like to schedule this after May 9. We'll go ahead and schedule for laparoscopic repair of large type III mixed type hernia with Nissen fundoplication.  The patient is a 75 year old female.   Other Problems Debra Sanchez, CMA; 07/16/2015 10:47 AM) Arthritis Gastric Ulcer Gastroesophageal Reflux Disease High blood pressure Thyroid Disease  Past Surgical History Debra Sanchez, CMA; 07/16/2015 10:47 AM) Spinal Surgery - Neck Spinal Surgery Midback  Allergies (Sonya Sanchez, CMA; 07/16/2015 10:49 AM) Codeine Phosphate *ANALGESICS - OPIOID* Latex Exam Gloves *MEDICAL DEVICES AND SUPPLIES* Morphine Sulfate (Concentrate) *ANALGESICS - OPIOID* Sulfa Antibiotics  Medication History (Sonya Sanchez,  CMA; 07/16/2015 10:49 AM) AmLODIPine Besylate (2.5MG  Tablet, Oral) Active. Fluticasone Propionate (50MCG/ACT Suspension, Nasal) Active. Losartan Potassium (100MG  Tablet, Oral) Active. Levothyroxine Sodium (50MCG Tablet, Oral) Active. Sertraline HCl (100MG  Tablet, Oral) Active. Pantoprazole Sodium (40MG  Tablet DR, Oral) Active. TraZODone HCl (50MG  Tablet, Oral) Active. Medications Reconciled  Pregnancy / Birth History Debra Sanchez, CMA; 07/16/2015 10:47 AM) Maternal age 37-20 Para 2  Vitals ([place]; 07/16/2015 10:48 AM) 07/16/2015 10:48 AM Height: 68in Pulse: 62 (Regular)  BP: 126/80 (Sitting, Left Arm, Standard)       Physical Exam ([name] Done MD; 07/16/2015 11:45 AM) The physical exam findings are as follows: Note:HEENT negative Neck-no masses (hypothyroid) Chest clear Heart SR without murmurs Abdomen nontender Ext FROM with no history of DVT Neuro alert and oriented x 3    Assessment & Plan [name] Done MD; 07/16/2015 11:46 AM) HIATAL HERNIA (K44.9) Impression: Will schedule repair of large type III mixed hiatal hernia at [facility].  Preop informed consent with the patient and her husband.

## 2015-08-25 ENCOUNTER — Encounter (HOSPITAL_COMMUNITY): Payer: Self-pay | Admitting: *Deleted

## 2015-08-25 ENCOUNTER — Inpatient Hospital Stay (HOSPITAL_COMMUNITY): Payer: Medicare Other | Admitting: Anesthesiology

## 2015-08-25 ENCOUNTER — Inpatient Hospital Stay (HOSPITAL_COMMUNITY)
Admission: RE | Admit: 2015-08-25 | Discharge: 2015-08-28 | DRG: 328 | Disposition: A | Discharge status: Home / Self Care | Payer: Medicare Other | Source: Ambulatory Visit | Attending: Surgery | Admitting: Surgery

## 2015-08-25 ENCOUNTER — Encounter (HOSPITAL_COMMUNITY)
Admission: RE | Disposition: A | Discharge status: Home / Self Care | Payer: Self-pay | Source: Ambulatory Visit | Attending: Surgery

## 2015-08-25 DIAGNOSIS — Z9889 Other specified postprocedural states: Secondary | ICD-10-CM

## 2015-08-25 DIAGNOSIS — K449 Diaphragmatic hernia without obstruction or gangrene: Principal | ICD-10-CM | POA: Diagnosis present

## 2015-08-25 DIAGNOSIS — I10 Essential (primary) hypertension: Secondary | ICD-10-CM | POA: Diagnosis present

## 2015-08-25 DIAGNOSIS — Z6830 Body mass index (BMI) 30.0-30.9, adult: Secondary | ICD-10-CM

## 2015-08-25 DIAGNOSIS — K219 Gastro-esophageal reflux disease without esophagitis: Secondary | ICD-10-CM | POA: Diagnosis not present

## 2015-08-25 DIAGNOSIS — E669 Obesity, unspecified: Secondary | ICD-10-CM | POA: Diagnosis present

## 2015-08-25 HISTORY — PX: HIATAL HERNIA REPAIR: SHX195

## 2015-08-25 LAB — CBC
HCT: 35.4 % — ABNORMAL LOW (ref 36.0–46.0)
Hemoglobin: 11.3 g/dL — ABNORMAL LOW (ref 12.0–15.0)
MCH: 28.1 pg (ref 26.0–34.0)
MCHC: 31.9 g/dL (ref 30.0–36.0)
MCV: 88.1 fL (ref 78.0–100.0)
Platelets: 203 10*3/uL (ref 150–400)
RBC: 4.02 MIL/uL (ref 3.87–5.11)
RDW: 16.1 % — ABNORMAL HIGH (ref 11.5–15.5)
WBC: 13.5 10*3/uL — ABNORMAL HIGH (ref 4.0–10.5)

## 2015-08-25 LAB — CREATININE, SERUM
Creatinine, Ser: 0.96 mg/dL (ref 0.44–1.00)
GFR calc Af Amer: >60 mL/min (ref >60)
GFR calc non Af Amer: 56 mL/min — ABNORMAL LOW (ref >60)

## 2015-08-25 SURGERY — REPAIR, HERNIA, HIATAL, LAPAROSCOPIC
Anesthesia: General | Site: Abdomen

## 2015-08-25 MED ORDER — LACTATED RINGERS IV SOLN
INTRAVENOUS | Status: DC
  Administered 2015-08-25: 13:00:00 via INTRAVENOUS
  Administered 2015-08-25: 1000 mL via INTRAVENOUS
  Administered 2015-08-25: 11:00:00 via INTRAVENOUS

## 2015-08-25 MED ORDER — OXYCODONE HCL 5 MG PO TABS: 5.0000 mg | ORAL_TABLET | Freq: Once | ORAL | Status: DC | PRN

## 2015-08-25 MED ORDER — MEPERIDINE HCL 50 MG/ML IJ SOLN: 6.2500 mg | INTRAMUSCULAR | Status: DC | PRN

## 2015-08-25 MED ORDER — PROMETHAZINE HCL 25 MG RE SUPP
25.0000 mg | Freq: Four times a day (QID) | RECTAL | Status: DC | PRN
  Administered 2015-08-25: 25 mg via RECTAL
  Filled 2015-08-25: qty 1

## 2015-08-25 MED ORDER — SODIUM CHLORIDE 0.9 % IJ SOLN
INTRAMUSCULAR | Status: DC | PRN
  Administered 2015-08-25: 30 mL

## 2015-08-25 MED ORDER — OXYCODONE HCL 5 MG/5ML PO SOLN: 5.0000 mg | Freq: Once | ORAL | Status: DC | PRN

## 2015-08-25 MED ORDER — DEXTROSE 5 % IV SOLN
2.0000 g | Freq: Two times a day (BID) | INTRAVENOUS | Status: AC
  Administered 2015-08-25: 2 g via INTRAVENOUS
  Filled 2015-08-25: qty 2

## 2015-08-25 MED ORDER — PROPOFOL 10 MG/ML IV BOLUS
INTRAVENOUS | Status: DC | PRN
  Administered 2015-08-25: 150 mg via INTRAVENOUS
  Administered 2015-08-25: 50 mg via INTRAVENOUS

## 2015-08-25 MED ORDER — ONDANSETRON 4 MG PO TBDP: 4.0000 mg | ORAL_TABLET | Freq: Four times a day (QID) | ORAL | Status: DC | PRN

## 2015-08-25 MED ORDER — SODIUM CHLORIDE 0.9 % IJ SOLN
INTRAMUSCULAR | Status: AC
  Filled 2015-08-25: qty 20

## 2015-08-25 MED ORDER — HYDROMORPHONE HCL 1 MG/ML IJ SOLN
INTRAMUSCULAR | Status: AC
  Filled 2015-08-25: qty 1

## 2015-08-25 MED ORDER — ROCURONIUM BROMIDE 50 MG/5ML IV SOLN
INTRAVENOUS | Status: AC
  Filled 2015-08-25: qty 2

## 2015-08-25 MED ORDER — ONDANSETRON HCL 4 MG/2ML IJ SOLN
INTRAMUSCULAR | Status: AC
  Filled 2015-08-25: qty 2

## 2015-08-25 MED ORDER — CHLORHEXIDINE GLUCONATE 4 % EX LIQD: 1.0000 "application " | Freq: Once | CUTANEOUS | Status: DC

## 2015-08-25 MED ORDER — MORPHINE SULFATE (PF) 2 MG/ML IV SOLN
1.0000 mg | INTRAVENOUS | Status: DC | PRN
  Administered 2015-08-25 – 2015-08-27 (×10): 1 mg via INTRAVENOUS
  Filled 2015-08-25 (×11): qty 1

## 2015-08-25 MED ORDER — PROMETHAZINE HCL 25 MG/ML IJ SOLN
INTRAMUSCULAR | Status: AC
  Filled 2015-08-25: qty 1

## 2015-08-25 MED ORDER — HYDROCODONE-ACETAMINOPHEN 5-325 MG PO TABS
1.0000 | ORAL_TABLET | ORAL | Status: DC | PRN
  Administered 2015-08-27 – 2015-08-28 (×5): 2 via ORAL
  Filled 2015-08-25 (×5): qty 2

## 2015-08-25 MED ORDER — ONDANSETRON HCL 4 MG/2ML IJ SOLN
4.0000 mg | Freq: Four times a day (QID) | INTRAMUSCULAR | Status: DC | PRN
  Administered 2015-08-25 – 2015-08-28 (×2): 4 mg via INTRAVENOUS
  Filled 2015-08-25 (×3): qty 2

## 2015-08-25 MED ORDER — LACTATED RINGERS IV SOLN
INTRAVENOUS | Status: DC | PRN
  Administered 2015-08-25: 1000 mL

## 2015-08-25 MED ORDER — LIDOCAINE HCL (CARDIAC) 20 MG/ML IV SOLN
INTRAVENOUS | Status: DC | PRN
  Administered 2015-08-25: 50 mg via INTRAVENOUS

## 2015-08-25 MED ORDER — AMLODIPINE BESYLATE 2.5 MG PO TABS
2.5000 mg | ORAL_TABLET | Freq: Every day | ORAL | Status: DC
  Administered 2015-08-25: 2.5 mg via ORAL
  Filled 2015-08-25: qty 1

## 2015-08-25 MED ORDER — SUGAMMADEX SODIUM 200 MG/2ML IV SOLN
INTRAVENOUS | Status: DC | PRN
  Administered 2015-08-25: 200 mg via INTRAVENOUS

## 2015-08-25 MED ORDER — PROMETHAZINE HCL 25 MG/ML IJ SOLN
6.2500 mg | Freq: Once | INTRAMUSCULAR | Status: AC
  Administered 2015-08-25: 6.25 mg via INTRAVENOUS

## 2015-08-25 MED ORDER — SUGAMMADEX SODIUM 200 MG/2ML IV SOLN
INTRAVENOUS | Status: AC
  Filled 2015-08-25: qty 2

## 2015-08-25 MED ORDER — HYDROMORPHONE HCL 1 MG/ML IJ SOLN
0.2500 mg | INTRAMUSCULAR | Status: DC | PRN
  Administered 2015-08-25 (×5): 0.25 mg via INTRAVENOUS

## 2015-08-25 MED ORDER — KCL IN DEXTROSE-NACL 20-5-0.45 MEQ/L-%-% IV SOLN
INTRAVENOUS | Status: DC
  Administered 2015-08-25: 17:00:00 via INTRAVENOUS
  Administered 2015-08-25: 100 mL/h via INTRAVENOUS
  Administered 2015-08-26: 15:00:00 via INTRAVENOUS
  Administered 2015-08-26: 100 mL/h via INTRAVENOUS
  Administered 2015-08-27: 12:00:00 via INTRAVENOUS
  Filled 2015-08-25 (×7): qty 1000

## 2015-08-25 MED ORDER — PANTOPRAZOLE SODIUM 40 MG IV SOLR
40.0000 mg | Freq: Every day | INTRAVENOUS | Status: DC
  Administered 2015-08-25 – 2015-08-27 (×3): 40 mg via INTRAVENOUS
  Filled 2015-08-25 (×4): qty 40

## 2015-08-25 MED ORDER — CEFOTETAN DISODIUM-DEXTROSE 2-2.08 GM-% IV SOLR
INTRAVENOUS | Status: AC
  Filled 2015-08-25: qty 50

## 2015-08-25 MED ORDER — CEFOTETAN DISODIUM-DEXTROSE 2-2.08 GM-% IV SOLR
2.0000 g | INTRAVENOUS | Status: AC
  Administered 2015-08-25: 2 g via INTRAVENOUS

## 2015-08-25 MED ORDER — FENTANYL CITRATE (PF) 250 MCG/5ML IJ SOLN
INTRAMUSCULAR | Status: AC
  Filled 2015-08-25: qty 5

## 2015-08-25 MED ORDER — FENTANYL CITRATE (PF) 100 MCG/2ML IJ SOLN
INTRAMUSCULAR | Status: DC | PRN
  Administered 2015-08-25: 100 ug via INTRAVENOUS
  Administered 2015-08-25 (×3): 50 ug via INTRAVENOUS

## 2015-08-25 MED ORDER — HEPARIN SODIUM (PORCINE) 5000 UNIT/ML IJ SOLN
5000.0000 [IU] | Freq: Three times a day (TID) | INTRAMUSCULAR | Status: DC
  Administered 2015-08-25 – 2015-08-28 (×8): 5000 [IU] via SUBCUTANEOUS
  Filled 2015-08-25 (×13): qty 1

## 2015-08-25 MED ORDER — HEPARIN SODIUM (PORCINE) 5000 UNIT/ML IJ SOLN
5000.0000 [IU] | Freq: Once | INTRAMUSCULAR | Status: AC
  Administered 2015-08-25: 5000 [IU] via SUBCUTANEOUS
  Filled 2015-08-25: qty 1

## 2015-08-25 MED ORDER — BUPIVACAINE LIPOSOME 1.3 % IJ SUSP
20.0000 mL | Freq: Once | INTRAMUSCULAR | Status: AC
  Administered 2015-08-25: 20 mL
  Filled 2015-08-25: qty 20

## 2015-08-25 MED ORDER — ONDANSETRON HCL 4 MG/2ML IJ SOLN
4.0000 mg | Freq: Once | INTRAMUSCULAR | Status: AC
  Administered 2015-08-25: 4 mg via INTRAVENOUS

## 2015-08-25 MED ORDER — EPHEDRINE SULFATE 50 MG/ML IJ SOLN
INTRAMUSCULAR | Status: DC | PRN
  Administered 2015-08-25: 5 mg via INTRAVENOUS
  Administered 2015-08-25: 10 mg via INTRAVENOUS
  Administered 2015-08-25: 5 mg via INTRAVENOUS
  Administered 2015-08-25: 15 mg via INTRAVENOUS
  Administered 2015-08-25: 5 mg via INTRAVENOUS

## 2015-08-25 MED ORDER — ROCURONIUM BROMIDE 100 MG/10ML IV SOLN
INTRAVENOUS | Status: DC | PRN
  Administered 2015-08-25: 20 mg via INTRAVENOUS
  Administered 2015-08-25: 10 mg via INTRAVENOUS
  Administered 2015-08-25: 50 mg via INTRAVENOUS

## 2015-08-25 SURGICAL SUPPLY — 53 items
APL SKNCLS STERI-STRIP NONHPOA (GAUZE/BANDAGES/DRESSINGS)
APPLIER CLIP ROT 10 11.4 M/L (STAPLE)
APR CLP MED LRG 11.4X10 (STAPLE)
BENZOIN TINCTURE PRP APPL 2/3 (GAUZE/BANDAGES/DRESSINGS) IMPLANT
CABLE HIGH FREQUENCY MONO STRZ (ELECTRODE) IMPLANT
CATH FOLEY LATEX FREE 16FR (CATHETERS) ×1 IMPLANT
CLAMP ENDO BABCK 10MM (STAPLE) IMPLANT
CLIP APPLIE ROT 10 11.4 M/L (STAPLE) IMPLANT
COVER SURGICAL LIGHT HANDLE (MISCELLANEOUS) ×2 IMPLANT
DECANTER SPIKE VIAL GLASS SM (MISCELLANEOUS) ×2 IMPLANT
DEVICE SUT QUICK LOAD TK 5 (STAPLE) ×6 IMPLANT
DEVICE SUT TI-KNOT TK 5X26 (MISCELLANEOUS) IMPLANT
DEVICE SUTURE ENDOST 10MM (ENDOMECHANICALS) ×2 IMPLANT
DISSECTOR BLUNT TIP ENDO 5MM (MISCELLANEOUS) ×2 IMPLANT
DRAIN PENROSE 18X1/2 LTX STRL (DRAIN) ×2 IMPLANT
DRAPE LAPAROSCOPIC ABDOMINAL (DRAPES) ×2 IMPLANT
ELECT PENCIL ROCKER SW 15FT (MISCELLANEOUS) IMPLANT
ELECT REM PT RETURN 9FT ADLT (ELECTROSURGICAL) ×2
ELECTRODE REM PT RTRN 9FT ADLT (ELECTROSURGICAL) ×1 IMPLANT
GLOVE BIOGEL M 8.0 STRL (GLOVE) ×2 IMPLANT
GOWN STRL REUS W/TWL XL LVL3 (GOWN DISPOSABLE) ×8 IMPLANT
GRASPER ENDO BABCOCK 10 (MISCELLANEOUS) IMPLANT
GRASPER ENDO BABCOCK 10MM (MISCELLANEOUS)
KIT BASIN OR (CUSTOM PROCEDURE TRAY) ×2 IMPLANT
LIQUID BAND (GAUZE/BANDAGES/DRESSINGS) ×2 IMPLANT
LUBRICANT JELLY K Y 4OZ (MISCELLANEOUS) ×1 IMPLANT
NDL SPNL 22GX3.5 QUINCKE BK (NEEDLE) ×1 IMPLANT
NEEDLE SPNL 22GX3.5 QUINCKE BK (NEEDLE) ×2 IMPLANT
PAD POSITIONING PINK XL (MISCELLANEOUS) IMPLANT
POSITIONER SURGICAL ARM (MISCELLANEOUS) IMPLANT
SCISSORS LAP 5X45 EPIX DISP (ENDOMECHANICALS) ×2 IMPLANT
SCRUB PCMX 4 OZ (MISCELLANEOUS) ×1 IMPLANT
SET IRRIG TUBING LAPAROSCOPIC (IRRIGATION / IRRIGATOR) ×2 IMPLANT
SHEARS HARMONIC ACE PLUS 45CM (MISCELLANEOUS) ×2 IMPLANT
SLEEVE ADV FIXATION 5X100MM (TROCAR) ×2 IMPLANT
SOLUTION ANTI FOG 6CC (MISCELLANEOUS) ×2 IMPLANT
STAPLER VISISTAT 35W (STAPLE) ×2 IMPLANT
STRIP CLOSURE SKIN 1/2X4 (GAUZE/BANDAGES/DRESSINGS) IMPLANT
SUT SURGIDAC NAB ES-9 0 48 120 (SUTURE) ×10 IMPLANT
SUT VIC AB 4-0 SH 18 (SUTURE) ×2 IMPLANT
TAPE CLOTH 4X10 WHT NS (GAUZE/BANDAGES/DRESSINGS) IMPLANT
TIP INNERVISION DETACH 40FR (MISCELLANEOUS) IMPLANT
TIP INNERVISION DETACH 50FR (MISCELLANEOUS) IMPLANT
TIP INNERVISION DETACH 56FR (MISCELLANEOUS) ×1 IMPLANT
TIPS INNERVISION DETACH 40FR (MISCELLANEOUS)
TOWEL OR 17X26 10 PK STRL BLUE (TOWEL DISPOSABLE) ×2 IMPLANT
TRAY FOLEY W/METER SILVER 14FR (SET/KITS/TRAYS/PACK) ×1 IMPLANT
TRAY FOLEY W/METER SILVER 16FR (SET/KITS/TRAYS/PACK) ×1 IMPLANT
TRAY LAPAROSCOPIC (CUSTOM PROCEDURE TRAY) ×2 IMPLANT
TROCAR ADV FIXATION 11X100MM (TROCAR) ×1 IMPLANT
TROCAR ADV FIXATION 5X100MM (TROCAR) ×1 IMPLANT
TROCAR BLADELESS OPT 5 100 (ENDOMECHANICALS) ×2 IMPLANT
TUBING INSUF HEATED (TUBING) ×2 IMPLANT

## 2015-08-25 NOTE — Anesthesia Preprocedure Evaluation (Addendum)
Anesthesia Evaluation  Patient identified by MRN, date of birth, ID band Patient awake    Reviewed: Allergy & Precautions, H&P , NPO status , Patient's Chart, lab work & pertinent test results  History of Anesthesia Complications (+) Family history of anesthesia reaction  Airway Mallampati: III  TM Distance: >3 FB     Dental  (+) Teeth Intact   Pulmonary pneumonia,    breath sounds clear to auscultation       Cardiovascular hypertension, Pt. on medications + Valvular Problems/Murmurs  Rhythm:Regular Rate:Normal     Neuro/Psych  Headaches, PSYCHIATRIC DISORDERS Anxiety Depression    GI/Hepatic hiatal hernia, PUD, GERD  ,  Endo/Other  Hypothyroidism   Renal/GU      Musculoskeletal  (+) Arthritis ,   Abdominal (+) + obese,   Peds  Hematology  (+) anemia ,   Anesthesia Other Findings   Reproductive/Obstetrics                           Anesthesia Physical  Anesthesia Plan  ASA: II  Anesthesia Plan: General   Post-op Pain Management:    Induction: Intravenous  Airway Management Planned: Oral ETT  Additional Equipment:   Intra-op Plan:   Post-operative Plan: Extubation in OR  Informed Consent: I have reviewed the patients History and Physical, chart, labs and discussed the procedure including the risks, benefits and alternatives for the proposed anesthesia with the patient or authorized representative who has indicated his/her understanding and acceptance.   Dental advisory given  Plan Discussed with: CRNA  Anesthesia Plan Comments: (.)       Anesthesia Quick Evaluation

## 2015-08-25 NOTE — Anesthesia Procedure Notes (Signed)
Procedure Name: Intubation Performed by: Gean Maidens Pre-anesthesia Checklist: Patient identified, Emergency Drugs available, Suction available, Patient being monitored and Timeout performed Patient Re-evaluated:Patient Re-evaluated prior to inductionPreoxygenation: Pre-oxygenation with 100% oxygen Intubation Type: IV induction Ventilation: Mask ventilation without difficulty Laryngoscope Size: Mac and 3 Grade View: Grade I Tube type: Oral Tube size: 7.0 mm Number of attempts: 1 Airway Equipment and Method: Stylet Placement Confirmation: ETT inserted through vocal cords under direct vision,  positive ETCO2,  CO2 detector and breath sounds checked- equal and bilateral Secured at: 21 cm Tube secured with: Tape Dental Injury: Teeth and Oropharynx as per pre-operative assessment

## 2015-08-25 NOTE — Anesthesia Postprocedure Evaluation (Signed)
Anesthesia Post Note  Patient: Debra Sanchez  Procedure(s) Performed: Procedure(s) (LRB): LAPAROSCOPIC REPAIR OF LARGE TYPE III  HIATAL HERNIA (N/A)  Patient location during evaluation: PACU Anesthesia Type: General Level of consciousness: sedated and patient cooperative Pain management: pain level controlled Vital Signs Assessment: post-procedure vital signs reviewed and stable Respiratory status: spontaneous breathing Cardiovascular status: stable Anesthetic complications: no    Last Vitals:  Filed Vitals:   08/25/15 1500 08/25/15 1536  BP: 148/81 160/78  Pulse: 81 85  Temp: 36.7 C 36.6 C  Resp: 15 15    Last Pain:  Filed Vitals:   08/25/15 1614  PainSc: Asleep                 [name]

## 2015-08-25 NOTE — Interval H&P Note (Signed)
History and Physical Interval Note:  08/25/2015 9:50 AM  Debra Sanchez  has presented today for surgery, with the diagnosis of Large type III hiatal hernia   The various methods of treatment have been discussed with the patient and family. After consideration of risks, benefits and other options for treatment, the patient has consented to  Procedure(s): [place] (N/A) as a surgical intervention .  The patient's history has been reviewed, patient examined, no change in status, stable for surgery.  I have reviewed the patient's chart and labs.  Questions were answered to the patient's satisfaction.     [name] B

## 2015-08-25 NOTE — Op Note (Signed)
Surgeon: Kaylyn Lim, MD, FACS  Asst:  Gurney Maxin, MD  Anes:  general  Procedure: Laparoscopic repair of large type III hiatus hernia, endoscopy, Nissen fundoplication over a 0000000 lighted bougie  Diagnosis: Large type III mixed hiatus hernia  Complications: none  EBL:   25 cc  Drains: none  Description of Procedure:  The patient was taken to OR 4 at [facility].  After anesthesia was administered and the patient was prepped a timeout was performed.  Access to the abdomen was achieved with a 5 mm Optiview through the left upper quadrant.  A total of 55 mm ports were used and one 1112 was placed to the right of midline. Nathanson retractor was inserted and the liver was elevated. A very large defect was noted and within it was about 60% of the stomach.  Dissection was begun on the patient's right side dissecting that in pulling the sac out of the chest. This was done with a Harmonic scalpel. Carried this anteriorly across midline down on the left crus. I didn't teases out of the mediastinum. I saw the right crus and left crus and then dissected posteriorly to be able to see both and inserted a nonlatex Penrose drain. With that traction I was able to mobilize the sac and the distal esophagus and get some length.  I then performed upper endoscopy and went into the stomach. We saw no evidence of esophageal or gastric injury. We proceeded back and identified the anterior and posterior vagus nerves which were preserved. Sac was then stripped off and a 56 lighted bougie was placed into the stomach. This was passed after we closed the hiatus with 3 sutures using Endo Stitch and time knots. That seemed to occupy the esophageal hiatus. I didn't feel I needed to tightness hiatus anymore. I then debrided the sac from the foregut bougie in place. I then reached around and grab the portion of stomach that I could contiguously wrap and brought it anteriorly. The other portion was brought up to the midline. A 3  sutures using Endo Stitch and 0 Surgidek were done thing purchases on the stomach the esophagogastric junction and the other wrap stomach and secured with tie knots. Everything looked to be in order. Nathanson retractor was removed ports. Port sites were injected with ex Georges Lynch and after deflation were closed with 4-0 Vicryl and Liquiban.   The patient tolerated the procedure well and was taken to the PACU in stable condition.     Matt B. Hassell Done, [place], [facility], [place]

## 2015-08-25 NOTE — Transfer of Care (Signed)
Immediate Anesthesia Transfer of Care Note  Patient: Debra Sanchez  Procedure(s) Performed: Procedure(s): LAPAROSCOPIC REPAIR OF LARGE TYPE III  HIATAL HERNIA (N/A)  Patient Location: PACU  Anesthesia Type:General  Level of Consciousness: awake, alert  and oriented  Airway & Oxygen Therapy: Patient Spontanous Breathing and Patient connected to face mask oxygen  Post-op Assessment: Report given to RN and Post -op Vital signs reviewed and stable  Post vital signs: Reviewed and stable  Last Vitals:  Filed Vitals:   08/25/15 0815 08/25/15 0855  BP: 161/81 147/76  Pulse: 71 67  Temp: 36.8 C   Resp: 18     Last Pain:  Filed Vitals:   08/25/15 1002  PainSc: 0-No pain      Patients Stated Pain Goal: 4 ([id number])  Complications: No apparent anesthesia complications

## 2015-08-26 ENCOUNTER — Inpatient Hospital Stay (HOSPITAL_COMMUNITY): Payer: Medicare Other

## 2015-08-26 LAB — BASIC METABOLIC PANEL
Anion gap: 6 (ref 5–15)
BUN: 9 mg/dL (ref 6–20)
CO2: 28 mmol/L (ref 22–32)
Calcium: 8.8 mg/dL — ABNORMAL LOW (ref 8.9–10.3)
Chloride: 108 mmol/L (ref 101–111)
Creatinine, Ser: 0.86 mg/dL (ref 0.44–1.00)
GFR calc Af Amer: >60 mL/min (ref >60)
GFR calc non Af Amer: >60 mL/min (ref >60)
Glucose, Bld: 119 mg/dL — ABNORMAL HIGH (ref 65–99)
Potassium: 4.2 mmol/L (ref 3.5–5.1)
Sodium: 142 mmol/L (ref 135–145)

## 2015-08-26 LAB — CBC
HCT: 34.7 % — ABNORMAL LOW (ref 36.0–46.0)
Hemoglobin: 11 g/dL — ABNORMAL LOW (ref 12.0–15.0)
MCH: 27.8 pg (ref 26.0–34.0)
MCHC: 31.7 g/dL (ref 30.0–36.0)
MCV: 87.8 fL (ref 78.0–100.0)
Platelets: 182 10*3/uL (ref 150–400)
RBC: 3.95 MIL/uL (ref 3.87–5.11)
RDW: 16 % — ABNORMAL HIGH (ref 11.5–15.5)
WBC: 7.6 10*3/uL (ref 4.0–10.5)

## 2015-08-26 MED ORDER — DIATRIZOATE MEGLUMINE & SODIUM 66-10 % PO SOLN: 30.0000 mL | Freq: Once | ORAL | Status: DC

## 2015-08-26 NOTE — Progress Notes (Signed)
Patient ID: Debra Sanchez, female   DOB: 11-29-1940, 75 y.o.   MRN: [id number] [facility] Progress Note:   1 Day Post-Op  Subjective: Mental status is clear.  Answered questions about surgery Objective: Vital signs in last 24 hours: Temp:  [97.4 F (36.3 C)-98.7 F (37.1 C)] 98.3 F (36.8 C) (05/17 0540) Pulse Rate:  [67-92] 71 (05/17 0540) Resp:  [10-18] 16 (05/17 0540) BP: (125-166)/(53-91) 130/59 mmHg (05/17 0540) SpO2:  [96 %-100 %] 100 % (05/17 0540) Weight:  [91.286 kg (201 lb 4 oz)] 91.286 kg (201 lb 4 oz) (05/16 0855)  Intake/Output from previous day: 05/16 0701 - 05/17 0700 In: 2801.7 [I.V.:2801.7] Out: 2700 [Urine:2675; Blood:25] Intake/Output this shift:    Physical Exam: Work of breathing is normal.    Lab Results:  Results for orders placed or performed during the hospital encounter of 08/25/15 (from the past 48 hour(s))  CBC     Status: Abnormal   Collection Time: 08/25/15  4:10 PM  Result Value Ref Range   WBC 13.5 (H) 4.0 - 10.5 K/uL   RBC 4.02 3.87 - 5.11 MIL/uL   Hemoglobin 11.3 (L) 12.0 - 15.0 g/dL   HCT 35.4 (L) 36.0 - 46.0 %   MCV 88.1 78.0 - 100.0 fL   MCH 28.1 26.0 - 34.0 pg   MCHC 31.9 30.0 - 36.0 g/dL   RDW 16.1 (H) 11.5 - 15.5 %   Platelets 203 150 - 400 K/uL  Creatinine, serum     Status: Abnormal   Collection Time: 08/25/15  4:10 PM  Result Value Ref Range   Creatinine, Ser 0.96 0.44 - 1.00 mg/dL   GFR calc non Af Amer 56 (L) >60 mL/min   GFR calc Af Amer >60 >60 mL/min    Comment: (NOTE) The eGFR has been calculated using the CKD EPI equation. This calculation has not been validated in all clinical situations. eGFR's persistently <60 mL/min signify possible Chronic Kidney Disease.   CBC     Status: Abnormal   Collection Time: 08/26/15  4:45 AM  Result Value Ref Range   WBC 7.6 4.0 - 10.5 K/uL   RBC 3.95 3.87 - 5.11 MIL/uL   Hemoglobin 11.0 (L) 12.0 - 15.0 g/dL   HCT 34.7 (L) 36.0 - 46.0 %   MCV 87.8 78.0 - 100.0  fL   MCH 27.8 26.0 - 34.0 pg   MCHC 31.7 30.0 - 36.0 g/dL   RDW 16.0 (H) 11.5 - 15.5 %   Platelets 182 150 - 400 K/uL  Basic metabolic panel     Status: Abnormal   Collection Time: 08/26/15  4:45 AM  Result Value Ref Range   Sodium 142 135 - 145 mmol/L   Potassium 4.2 3.5 - 5.1 mmol/L   Chloride 108 101 - 111 mmol/L   CO2 28 22 - 32 mmol/L   Glucose, Bld 119 (H) 65 - 99 mg/dL   BUN 9 6 - 20 mg/dL   Creatinine, Ser 0.86 0.44 - 1.00 mg/dL   Calcium 8.8 (L) 8.9 - 10.3 mg/dL   GFR calc non Af Amer >60 >60 mL/min   GFR calc Af Amer >60 >60 mL/min    Comment: (NOTE) The eGFR has been calculated using the CKD EPI equation. This calculation has not been validated in all clinical situations. eGFR's persistently <60 mL/min signify possible Chronic Kidney Disease.    Anion gap 6 5 - 15    Radiology/Results: No results found.  Anti-infectives: Anti-infectives  Start     Dose/Rate Route Frequency Ordered Stop   08/25/15 2200  cefoTEtan (CEFOTAN) 2 g in dextrose 5 % 50 mL IVPB     2 g 100 mL/hr over 30 Minutes Intravenous Every 12 hours 08/25/15 1553 08/26/15 0005   08/25/15 0842  cefoTEtan in Dextrose 5% (CEFOTAN) IVPB 2 g     2 g Intravenous On call to O.R. 08/25/15 0842 08/25/15 1040      Assessment/Plan: Problem List: Patient Active Problem List   Diagnosis Date Noted  . Status post laparoscopic Nissen fundoplication 24/81/4439  . Hiatal hernia 07/20/2015  . Pulmonary nodules 05/12/2015  . Acute maxillary sinusitis 04/20/2015  . Insomnia 04/20/2015  . CAP (community acquired pneumonia) 04/20/2015  . Abnormal CXR 04/20/2015  . Melena 07/19/2012  . Hypertension 05/15/2012  . Hypothyroidism 05/15/2012  . History of colonic polyps 05/15/2012  . Anemia 05/15/2012  . Upper GI bleed 05/15/2012  . Heme positive stool 05/15/2012    UGI pending today.   1 Day Post-Op    LOS: 1 day   Matt B. Hassell Done, MD, [facility] [phone]  beeper [phone]  08/26/2015 8:29 AM

## 2015-08-26 NOTE — Progress Notes (Addendum)
PA on call paged by office as patient is requesting fluids post swallow exam.  Await return call.  Dr Donne Hazel returned page and stated she could have ice chips at this time

## 2015-08-27 LAB — BASIC METABOLIC PANEL
Anion gap: 4 — ABNORMAL LOW (ref 5–15)
BUN: 6 mg/dL (ref 6–20)
CO2: 30 mmol/L (ref 22–32)
Calcium: 8.8 mg/dL — ABNORMAL LOW (ref 8.9–10.3)
Chloride: 105 mmol/L (ref 101–111)
Creatinine, Ser: 0.84 mg/dL (ref 0.44–1.00)
GFR calc Af Amer: >60 mL/min (ref >60)
GFR calc non Af Amer: >60 mL/min (ref >60)
Glucose, Bld: 107 mg/dL — ABNORMAL HIGH (ref 65–99)
Potassium: 4.2 mmol/L (ref 3.5–5.1)
Sodium: 139 mmol/L (ref 135–145)

## 2015-08-27 LAB — CBC
HCT: 36 % (ref 36.0–46.0)
Hemoglobin: 11.4 g/dL — ABNORMAL LOW (ref 12.0–15.0)
MCH: 28.1 pg (ref 26.0–34.0)
MCHC: 31.7 g/dL (ref 30.0–36.0)
MCV: 88.7 fL (ref 78.0–100.0)
Platelets: 184 10*3/uL (ref 150–400)
RBC: 4.06 MIL/uL (ref 3.87–5.11)
RDW: 16.1 % — ABNORMAL HIGH (ref 11.5–15.5)
WBC: 5 10*3/uL (ref 4.0–10.5)

## 2015-08-27 NOTE — Progress Notes (Signed)
Patient ID: Debra Sanchez, female   DOB: 1940-07-22, 75 y.o.   MRN: [id number] [facility] Surgery Progress Note:   2 Days Post-Op  Subjective: Mental status is clear.  I gave her a pic of her hernia and surgery Objective: Vital signs in last 24 hours: Temp:  [98.6 F (37 C)-98.9 F (37.2 C)] 98.6 F (37 C) (05/18 0612) Pulse Rate:  [68-79] 68 (05/18 0612) Resp:  [16] 16 (05/18 0612) BP: (162-174)/(83-88) 174/83 mmHg (05/18 0612) SpO2:  [91 %-99 %] 91 % (05/18 0612)  Intake/Output from previous day: 05/17 0701 - 05/18 0700 In: 3080 [I.V.:3080] Out: 3900 [Urine:3900] Intake/Output this shift:    Physical Exam: Work of breathing is normal.  Minimal pain.    Lab Results:  Results for orders placed or performed during the hospital encounter of 08/25/15 (from the past 48 hour(s))  CBC     Status: Abnormal   Collection Time: 08/25/15  4:10 PM  Result Value Ref Range   WBC 13.5 (H) 4.0 - 10.5 K/uL   RBC 4.02 3.87 - 5.11 MIL/uL   Hemoglobin 11.3 (L) 12.0 - 15.0 g/dL   HCT 35.4 (L) 36.0 - 46.0 %   MCV 88.1 78.0 - 100.0 fL   MCH 28.1 26.0 - 34.0 pg   MCHC 31.9 30.0 - 36.0 g/dL   RDW 16.1 (H) 11.5 - 15.5 %   Platelets 203 150 - 400 K/uL  Creatinine, serum     Status: Abnormal   Collection Time: 08/25/15  4:10 PM  Result Value Ref Range   Creatinine, Ser 0.96 0.44 - 1.00 mg/dL   GFR calc non Af Amer 56 (L) >60 mL/min   GFR calc Af Amer >60 >60 mL/min    Comment: (NOTE) The eGFR has been calculated using the CKD EPI equation. This calculation has not been validated in all clinical situations. eGFR's persistently <60 mL/min signify possible Chronic Kidney Disease.   CBC     Status: Abnormal   Collection Time: 08/26/15  4:45 AM  Result Value Ref Range   WBC 7.6 4.0 - 10.5 K/uL   RBC 3.95 3.87 - 5.11 MIL/uL   Hemoglobin 11.0 (L) 12.0 - 15.0 g/dL   HCT 34.7 (L) 36.0 - 46.0 %   MCV 87.8 78.0 - 100.0 fL   MCH 27.8 26.0 - 34.0 pg   MCHC 31.7 30.0 - 36.0 g/dL   RDW  16.0 (H) 11.5 - 15.5 %   Platelets 182 150 - 400 K/uL  Basic metabolic panel     Status: Abnormal   Collection Time: 08/26/15  4:45 AM  Result Value Ref Range   Sodium 142 135 - 145 mmol/L   Potassium 4.2 3.5 - 5.1 mmol/L   Chloride 108 101 - 111 mmol/L   CO2 28 22 - 32 mmol/L   Glucose, Bld 119 (H) 65 - 99 mg/dL   BUN 9 6 - 20 mg/dL   Creatinine, Ser 0.86 0.44 - 1.00 mg/dL   Calcium 8.8 (L) 8.9 - 10.3 mg/dL   GFR calc non Af Amer >60 >60 mL/min   GFR calc Af Amer >60 >60 mL/min    Comment: (NOTE) The eGFR has been calculated using the CKD EPI equation. This calculation has not been validated in all clinical situations. eGFR's persistently <60 mL/min signify possible Chronic Kidney Disease.    Anion gap 6 5 - 15  CBC     Status: Abnormal   Collection Time: 08/27/15  4:37 AM  Result Value  Ref Range   WBC 5.0 4.0 - 10.5 K/uL   RBC 4.06 3.87 - 5.11 MIL/uL   Hemoglobin 11.4 (L) 12.0 - 15.0 g/dL   HCT 36.0 36.0 - 46.0 %   MCV 88.7 78.0 - 100.0 fL   MCH 28.1 26.0 - 34.0 pg   MCHC 31.7 30.0 - 36.0 g/dL   RDW 16.1 (H) 11.5 - 15.5 %   Platelets 184 150 - 400 K/uL  Basic metabolic panel     Status: Abnormal   Collection Time: 08/27/15  4:37 AM  Result Value Ref Range   Sodium 139 135 - 145 mmol/L   Potassium 4.2 3.5 - 5.1 mmol/L   Chloride 105 101 - 111 mmol/L   CO2 30 22 - 32 mmol/L   Glucose, Bld 107 (H) 65 - 99 mg/dL   BUN 6 6 - 20 mg/dL   Creatinine, Ser 0.84 0.44 - 1.00 mg/dL   Calcium 8.8 (L) 8.9 - 10.3 mg/dL   GFR calc non Af Amer >60 >60 mL/min   GFR calc Af Amer >60 >60 mL/min    Comment: (NOTE) The eGFR has been calculated using the CKD EPI equation. This calculation has not been validated in all clinical situations. eGFR's persistently <60 mL/min signify possible Chronic Kidney Disease.    Anion gap 4 (L) 5 - 15    Radiology/Results: Dg Ugi W/water Sol Cm  08/26/2015  CLINICAL DATA:  Hiatal hernia repair yesterday. EXAM: WATER SOLUBLE UPPER GI SERIES  TECHNIQUE: Single-column upper GI series was performed using water soluble contrast. CONTRAST:  30 cc Gastrografin. COMPARISON:  06/04/2015. FLUOROSCOPY TIME:  Radiation Exposure Index (as provided by the fluoroscopic device): 6.8 mGy If the device does not provide the exposure index: Fluoroscopy Time (in minutes and seconds):  0 minutes 40 seconds. Number of Acquired Images:  1 FINDINGS: Scout view of the abdomen shows a normal bowel gas pattern. Patient drank 30 cc of Gastrografin. Postoperative changes of Nissen fundoplication. There is slight dilatation just above the level of the wrap, which may be physiologic. No leak. IMPRESSION: Nissen fundoplication with slight dilatation above the wrap, possibly physiologic. Electronically Signed   By: Lorin Picket M.D.   On: 08/26/2015 10:51    Anti-infectives: Anti-infectives    Start     Dose/Rate Route Frequency Ordered Stop   08/25/15 2200  cefoTEtan (CEFOTAN) 2 g in dextrose 5 % 50 mL IVPB     2 g 100 mL/hr over 30 Minutes Intravenous Every 12 hours 08/25/15 1553 08/26/15 0005   08/25/15 0842  cefoTEtan in Dextrose 5% (CEFOTAN) IVPB 2 g     2 g Intravenous On call to O.R. 08/25/15 0842 08/25/15 1040      Assessment/Plan: Problem List: Patient Active Problem List   Diagnosis Date Noted  . Status post laparoscopic Nissen fundoplication 07/86/7544  . Hiatal hernia 07/20/2015  . Pulmonary nodules 05/12/2015  . Acute maxillary sinusitis 04/20/2015  . Insomnia 04/20/2015  . CAP (community acquired pneumonia) 04/20/2015  . Abnormal CXR 04/20/2015  . Melena 07/19/2012  . Hypertension 05/15/2012  . Hypothyroidism 05/15/2012  . History of colonic polyps 05/15/2012  . Anemia 05/15/2012  . Upper GI bleed 05/15/2012  . Heme positive stool 05/15/2012    Begin clear liquids.   2 Days Post-Op    LOS: 2 days   Matt B. Hassell Done, MD, [facility] [phone] beeper [phone]  08/27/2015 8:21 AM

## 2015-08-27 NOTE — Progress Notes (Signed)
Dr. Zella Richer aware via phone pt POD#2 and foley catheter in. Adequate amount of clear, yellow urine noted. See new orders received.

## 2015-08-28 MED ORDER — ONDANSETRON 4 MG PO TBDP: 4.0000 mg | ORAL_TABLET | Freq: Four times a day (QID) | ORAL | Status: DC | PRN

## 2015-08-28 MED ORDER — HYDROCODONE-ACETAMINOPHEN 5-325 MG PO TABS: 1.0000 | ORAL_TABLET | ORAL | Status: DC | PRN

## 2015-08-28 NOTE — Discharge Summary (Signed)
Physician Discharge Summary  Patient ID: Debra Sanchez MRN: [id number] DOB/AGE: 09/01/1940 75 y.o.  Admit date: 08/25/2015 Discharge date: 08/28/2015  Admission Diagnoses:  Large type III mixed hiatal hernia  Discharge Diagnoses:  Same post repair of hiatus and Nissen fundoplication  Active Problems:   Status post laparoscopic Nissen fundoplication   Surgery:  Lap repair of large type III hiatal hernia  Discharged Condition: improved  Hospital Course:   Had surgery.  Swallow on PD 1.  Liquids begund and advanced.    Consults: none  Significant Diagnostic Studies: UGI    Discharge Exam: Blood pressure 139/66, pulse 61, temperature 98 F (36.7 C), temperature source Oral, resp. rate 18, height 5\' 8"  (1.727 m), weight 91.286 kg (201 lb 4 oz), SpO2 100 %. incisions  Disposition: 01-Home or Self Care  Discharge Instructions    Call MD for:  persistant nausea and vomiting    Complete by:  As directed      Diet - low sodium heart healthy    Complete by:  As directed      Discharge instructions    Complete by:  As directed   Full liquids by mouth for a week then pureed food for 1 month     Increase activity slowly    Complete by:  As directed      No wound care    Complete by:  As directed             Medication List    STOP taking these medications        pantoprazole 40 MG tablet  Commonly known as:  PROTONIX      TAKE these medications        acetaminophen 325 MG tablet  Commonly known as:  TYLENOL  Take 325 mg by mouth every 6 (six) hours as needed (For pain.).     amLODipine 2.5 MG tablet  Commonly known as:  NORVASC  Take 2.5 mg by mouth daily.     B-12 PO  Take 1 tablet by mouth daily.     CALCIUM PO  Take 1 tablet by mouth daily.     ferrous sulfate 325 (65 FE) MG tablet  Take 325 mg by mouth daily with breakfast.     FISH OIL PO  Take 1 capsule by mouth daily.     fluticasone 50 MCG/ACT nasal spray  Commonly known as:  FLONASE   Place 2 sprays into both nostrils daily.     hydrochlorothiazide 12.5 MG capsule  Commonly known as:  MICROZIDE  Take 1 capsule (12.5 mg total) by mouth daily as needed (swelling).     HYDROcodone-acetaminophen 5-325 MG tablet  Commonly known as:  NORCO/VICODIN  Take 1-2 tablets by mouth every 4 (four) hours as needed for moderate pain.     levothyroxine 50 MCG tablet  Commonly known as:  SYNTHROID, LEVOTHROID  Take 50 mcg by mouth daily.     losartan 100 MG tablet  Commonly known as:  COZAAR  TAKE (1) TABLET BY MOUTH ONCE DAILY.     Melatonin 5 MG Tabs  Take 5 mg by mouth at bedtime.     multivitamin with minerals Tabs tablet  Take 1 tablet by mouth daily.     ondansetron 4 MG disintegrating tablet  Commonly known as:  ZOFRAN-ODT  Take 1 tablet (4 mg total) by mouth every 6 (six) hours as needed for nausea.     OVER THE COUNTER MEDICATION  Place 1 drop  into both eyes 4 (four) times daily as needed (For dry eyes.). Visine Dry Eye Relief     sertraline 100 MG tablet  Commonly known as:  ZOLOFT  Take 50 mg by mouth at bedtime.     SUMAtriptan 100 MG tablet  Commonly known as:  IMITREX  Take 100 mg by mouth every 2 (two) hours as needed for migraine. May repeat in 2 hours if headache persists or recurs.     VITAMIN D PO  Take 1 tablet by mouth daily.           Follow-up Information    Follow up with Johnathan Hausen B, MD. Schedule an appointment as soon as possible for a visit in 3 weeks.   Specialty:  General Surgery   Contact information:   [address] [place] [address] [phone]       Signed: Pedro Earls 08/28/2015, 8:13 AM

## 2015-08-28 NOTE — Discharge Instructions (Signed)
Laparoscopic Nissen Fundoplication, Care After °Refer to this sheet in the next few weeks. These instructions provide you with information about caring for yourself after your procedure. Your health care provider may also give you more specific instructions. Your treatment has been planned according to current medical practices, but problems sometimes occur. Call your health care provider if you have any problems or questions after your procedure. °WHAT TO EXPECT AFTER THE PROCEDURE °After your procedure, it is common to have:  °· Difficulty swallowing (dysphagia). °· Excess gas (bloating). °HOME CARE INSTRUCTIONS °Medicines °· Take medicines only as directed by your health care provider. °· Do not drive or operate heavy machinery while taking pain medicine. °Incision Care °· There are many different ways to close and cover an incision, including stitches (sutures), skin glue, and adhesive strips. Follow your health care provider's instructions about: °¨ Incision care. °¨ Bandage (dressing) changes and removal. °¨ Incision closure removal. °· Check your incision areas every day for signs of infection. Watch for: °¨ Redness, swelling, or pain. °¨ Fluid, blood, or pus. °· Do not take baths, swim, or use a hot tub until your health care provider approves. Take showers as directed by your health care provider. °Eating and Drinking °· Follow your health care provider's instructions about eating. °¨ You may need to follow a liquid-only diet for 2 weeks, followed by a diet of soft foods for 2 weeks. °¨ You should return to your usual diet gradually. °· Drink enough fluid to keep your urine clear or pale yellow. °Activities °· Return to your normal activities as directed by your health care provider. Ask your health care provider what activities are safe for you. °· Avoid strenuous exercise. °· Do not lift anything that is heavier than 10 lb (4.5 kg). °· Ask your health care provider when you can: °¨ Return to sexual  activity. °¨ Drive. °¨ Go back to work. °SEEK MEDICAL CARE IF: °· You have a fever. °· Your pain gets worse or is not helped by medicine. °· You have frequent nausea or vomiting. °· You have continued abdominal bloating. °· You have an ongoing (persistent) cough. °· You have redness, swelling, or pain in any incision areas. °· You have fluid, blood, or pus coming from any incisions. °SEEK IMMEDIATE MEDICAL CARE IF: °· You have trouble breathing. °· You are unable to swallow. °· You have persistent vomiting. °· You have blood in your vomit. °· You have severe abdominal pain. °  °This information is not intended to replace advice given to you by your health care provider. Make sure you discuss any questions you have with your health care provider. °  °Document Released: 11/19/2003 Document Revised: 04/18/2014 Document Reviewed: 11/27/2013 °Elsevier Interactive Patient Education ©2016 [employer] ° °

## 2015-08-28 NOTE — Care Management Important Message (Signed)
Important Message  Patient Details  Name: Debra Sanchez MRN: [id number] Date of Birth: 28-Mar-1941   Medicare Important Message Given:  Yes    Camillo Flaming 08/28/2015, 9:12 AMImportant Message  Patient Details  Name: Debra Sanchez MRN: [id number] Date of Birth: November 08, 1940   Medicare Important Message Given:  Yes    Camillo Flaming 08/28/2015, 9:12 AM

## 2015-08-28 NOTE — Progress Notes (Signed)
Pt's vitals are WNL, pain is under control and pt is tolerating diet. Discussed discharge instructions with both patient and husband. Discharged home with prescriptions.

## 2015-09-04 ENCOUNTER — Other Ambulatory Visit (INDEPENDENT_AMBULATORY_CARE_PROVIDER_SITE_OTHER): Payer: Self-pay | Admitting: Internal Medicine

## 2015-12-08 ENCOUNTER — Other Ambulatory Visit (INDEPENDENT_AMBULATORY_CARE_PROVIDER_SITE_OTHER): Payer: Self-pay | Admitting: Internal Medicine

## 2016-01-01 DIAGNOSIS — R05 Cough: Secondary | ICD-10-CM | POA: Diagnosis not present

## 2016-01-21 ENCOUNTER — Encounter (INDEPENDENT_AMBULATORY_CARE_PROVIDER_SITE_OTHER): Payer: Self-pay | Admitting: *Deleted

## 2016-01-22 ENCOUNTER — Other Ambulatory Visit (HOSPITAL_COMMUNITY): Payer: Self-pay | Admitting: Surgery

## 2016-01-22 DIAGNOSIS — Z9889 Other specified postprocedural states: Secondary | ICD-10-CM

## 2016-01-25 ENCOUNTER — Ambulatory Visit: Payer: Medicare Other | Admitting: Family Medicine

## 2016-01-27 ENCOUNTER — Ambulatory Visit (INDEPENDENT_AMBULATORY_CARE_PROVIDER_SITE_OTHER): Payer: Medicare Other | Admitting: Internal Medicine

## 2016-01-28 ENCOUNTER — Ambulatory Visit (HOSPITAL_COMMUNITY)
Admission: RE | Admit: 2016-01-28 | Discharge: 2016-01-28 | Disposition: A | Discharge status: Home / Self Care | Payer: Medicare Other | Source: Ambulatory Visit | Attending: Surgery | Admitting: Surgery

## 2016-01-28 DIAGNOSIS — K219 Gastro-esophageal reflux disease without esophagitis: Secondary | ICD-10-CM | POA: Diagnosis not present

## 2016-01-28 DIAGNOSIS — K449 Diaphragmatic hernia without obstruction or gangrene: Secondary | ICD-10-CM | POA: Diagnosis not present

## 2016-01-28 DIAGNOSIS — Z9889 Other specified postprocedural states: Secondary | ICD-10-CM | POA: Diagnosis not present

## 2016-02-01 ENCOUNTER — Telehealth: Payer: Self-pay | Admitting: Family Medicine

## 2016-02-01 ENCOUNTER — Encounter: Payer: Self-pay | Admitting: Family Medicine

## 2016-02-01 ENCOUNTER — Ambulatory Visit (INDEPENDENT_AMBULATORY_CARE_PROVIDER_SITE_OTHER): Payer: Medicare Other | Admitting: Family Medicine

## 2016-02-01 VITALS — BP 100/67 | HR 73 | Temp 98.2°F | Resp 20 | Ht 68.0 in | Wt 197.8 lb

## 2016-02-01 DIAGNOSIS — I10 Essential (primary) hypertension: Secondary | ICD-10-CM | POA: Diagnosis not present

## 2016-02-01 DIAGNOSIS — G4700 Insomnia, unspecified: Secondary | ICD-10-CM

## 2016-02-01 DIAGNOSIS — E039 Hypothyroidism, unspecified: Secondary | ICD-10-CM

## 2016-02-01 DIAGNOSIS — Z9889 Other specified postprocedural states: Secondary | ICD-10-CM

## 2016-02-01 LAB — TSH: TSH: 1.39 u[IU]/mL (ref 0.35–4.50)

## 2016-02-01 MED ORDER — LEVOTHYROXINE SODIUM 50 MCG PO TABS: 50.0000 ug | ORAL_TABLET | Freq: Every day | ORAL | 3 refills | Status: DC

## 2016-02-01 MED ORDER — AMLODIPINE BESYLATE 2.5 MG PO TABS: 2.5000 mg | ORAL_TABLET | Freq: Every day | ORAL | 1 refills | Status: DC

## 2016-02-01 MED ORDER — LOSARTAN POTASSIUM 100 MG PO TABS: ORAL_TABLET | ORAL | 1 refills | Status: DC

## 2016-02-01 MED ORDER — SUMATRIPTAN SUCCINATE 100 MG PO TABS: 100.0000 mg | ORAL_TABLET | ORAL | 2 refills | Status: DC | PRN

## 2016-02-01 MED ORDER — SERTRALINE HCL 100 MG PO TABS: 100.0000 mg | ORAL_TABLET | Freq: Every day | ORAL | 1 refills | Status: DC

## 2016-02-01 NOTE — Telephone Encounter (Signed)
TSH in normal range. Refills called in for her.  If fatigue continues she will need to make an appt to address. Especially if her BP remain lower than her normal.

## 2016-02-01 NOTE — Patient Instructions (Signed)
Every 6 months  For chronic medical conditions.  I will refill thyroid medication after results tomorrow.

## 2016-02-01 NOTE — Progress Notes (Signed)
Debra Sanchez , [birth year], 75 y.o., female MRN: [id number] Patient Care Team    Relationship Specialty Notifications Start End  Ma Hillock, DO PCP - General Family Medicine  04/20/15     CC: chronic disease  Subjective:  Hypothyroidism, unspecified type Pt reports compliance with levothyroxine 50 mcg daily. She endorses feeling more fatigued the last few weeks. She denies constipation, diarrhea, flushing or palpitations. She has not had her TSH drawn in over a year.  Essential hypertension Pt BP is controlled on current regimen of losartan 100 mg, amlodipine 2.5 mg. She reports normal BP range of 120-130/70-80.She has been more fatigued the last few weeks. She denies chest pain, shortness of breath, dizziness, LE edema, or syncope.   Insomnia, unspecified type Pt continues to take zoloft 100 mg for anxiety/insomnia. She reports she is unable "turn her brain" off at night and feels the zoloft is helpful. She denies side effects to medication and is in need of refills today.   Status post laparoscopic Nissen fundoplication Pt states her hiatal hernia repair has failed. She has follow up scheduled with them November 10 th. She states she may desire a second opinion depending on how that appt goes.    Allergies  Allergen Reactions  . Codeine Itching  . Latex Other (See Comments)    fainted  . Morphine And Related Itching  . Sulfa Antibiotics Nausea And Vomiting   Social History  Substance Use Topics  . Smoking status: Never Smoker  . Smokeless tobacco: Never Used  . Alcohol use No   Past Medical History:  Diagnosis Date  . Anemia   . Anxiety   . Arthritis   . Blood transfusion   . Colon polyp   . DDD (degenerative disc disease), thoracolumbar   . Degenerative disc disease, lumbar   . Family history of adverse reaction to anesthesia    pts sister got very sick / burnt esophagus   . Gastric ulcer   . GERD (gastroesophageal reflux disease)   . Heart murmur     . History of hiatal hernia   . Hypertension   . Hypothyroidism   . Migraines   . Miscarriage   . Osteoporosis   . Syncope   . Wears glasses    Past Surgical History:  Procedure Laterality Date  . ABDOMINAL HYSTERECTOMY    . BILATERAL OOPHORECTOMY    . CATARACT EXTRACTION W/PHACO Left 10/20/2014   Procedure: CATARACT EXTRACTION PHACO AND INTRAOCULAR LENS PLACEMENT LEFT EYE;  Surgeon: Tonny Branch, MD;  Location: [facility];  Service: Ophthalmology;  Laterality: Left;  CDE:6.98  . CATARACT EXTRACTION W/PHACO Right 11/17/2014   Procedure: CATARACT EXTRACTION PHACO AND INTRAOCULAR LENS PLACEMENT RIGHT EYE CDE=6.57;  Surgeon: Tonny Branch, MD;  Location: [facility];  Service: Ophthalmology;  Laterality: Right;  . COLONOSCOPY  01/19/2011   Procedure: COLONOSCOPY;  Surgeon: Rogene Houston, MD;  Location: [facility];  Service: Endoscopy;  Laterality: N/A;  2:00  . DILATION AND CURETTAGE OF UTERUS    . ESOPHAGOGASTRODUODENOSCOPY  11/05/2010   Procedure: ESOPHAGOGASTRODUODENOSCOPY (EGD);  Surgeon: Rogene Houston, MD;  Location: [facility];  Service: Endoscopy;  Laterality: N/A;  7:00 am per [facility]  . ESOPHAGOGASTRODUODENOSCOPY N/A 07/25/2012   Procedure: ESOPHAGOGASTRODUODENOSCOPY (EGD);  Surgeon: Rogene Houston, MD;  Location: [facility];  Service: Endoscopy;  Laterality: N/A;  400-moved to 66 Ann notified pt  . ESOPHAGOGASTRODUODENOSCOPY N/A 05/27/2015   Procedure: ESOPHAGOGASTRODUODENOSCOPY (EGD);  Surgeon: Bernadene Person  Gloriann Loan, MD;  Location: [facility];  Service: Endoscopy;  Laterality: N/A;  2:00  . HIATAL HERNIA REPAIR N/A 08/25/2015   Procedure: LAPAROSCOPIC REPAIR OF LARGE TYPE III  HIATAL HERNIA;  Surgeon: Johnathan Hausen, MD;  Location: [facility];  Service: General;  Laterality: N/A;  . NECK SURGERY     cervical disc  . 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 thoracic tumor     Family History  Problem Relation Age of Onset  . Colon cancer Mother   .  Heart disease Mother   . Arthritis Mother   . Heart disease Father   . Arthritis Father   . Arthritis Sister   . Arthritis Brother   . Arthritis Maternal Aunt   . Arthritis Maternal Uncle   . Diabetes Maternal Uncle      Medication List       Accurate as of 02/01/16  2:46 PM. Always use your most recent med list.          acetaminophen 325 MG tablet Commonly known as:  TYLENOL Take 325 mg by mouth every 6 (six) hours as needed (For pain.).   amLODipine 2.5 MG tablet Commonly known as:  NORVASC Take 2.5 mg by mouth daily.   B-12 PO Take 1 tablet by mouth daily.   CALCIUM PO Take 1 tablet by mouth daily.   ferrous sulfate 325 (65 FE) MG tablet Take 325 mg by mouth daily with breakfast.   FISH OIL PO Take 1 capsule by mouth daily.   fluticasone 50 MCG/ACT nasal spray Commonly known as:  FLONASE Place 2 sprays into both nostrils daily.   hydrochlorothiazide 12.5 MG capsule Commonly known as:  MICROZIDE Take 1 capsule (12.5 mg total) by mouth daily as needed (swelling).   levothyroxine 50 MCG tablet Commonly known as:  SYNTHROID, LEVOTHROID Take 50 mcg by mouth daily.   losartan 100 MG tablet Commonly known as:  COZAAR TAKE (1) TABLET BY MOUTH ONCE DAILY.   Melatonin 5 MG Tabs Take 5 mg by mouth at bedtime.   multivitamin with minerals Tabs tablet Take 1 tablet by mouth daily.   ondansetron 4 MG disintegrating tablet Commonly known as:  ZOFRAN-ODT Take 1 tablet (4 mg total) by mouth every 6 (six) hours as needed for nausea.   OVER THE COUNTER MEDICATION Place 1 drop into both eyes 4 (four) times daily as needed (For dry eyes.). Visine Dry Eye Relief   pantoprazole 40 MG tablet Commonly known as:  PROTONIX TAKE 1 TABLET TWICE A DAY BEFORE MEALS.   sertraline 100 MG tablet Commonly known as:  ZOLOFT Take 50 mg by mouth at bedtime.   SUMAtriptan 100 MG tablet Commonly known as:  IMITREX Take 100 mg by mouth every 2 (two) hours as needed for  migraine. May repeat in 2 hours if headache persists or recurs.   VITAMIN D PO Take 1 tablet by mouth daily.       No results found for this or any previous visit (from the past 24 hour(s)). No results found.   ROS: Negative, with the exception of above mentioned in HPI   Objective:  BP 100/67 (BP Location: Left Arm, Patient Position: Sitting, Cuff Size: Large)   Pulse 73   Temp 98.2 F (36.8 C)   Resp 20   Ht 5\' 8"  (1.727 m)   Wt 197 lb 12 oz (89.7 kg)   SpO2 99%  BMI 30.07 kg/m  Body mass index is 30.07 kg/m. Gen: Afebrile. No acute distress. Nontoxic in appearance, well developed, well nourished.  HENT: AT. [place].MMM, no oral lesions.  Eyes:Pupils Equal Round Reactive to light, Extraocular movements intact,  Conjunctiva without redness, discharge or icterus. Neck/lymp/endocrine: Supple,no lymphadenopathy, no thyromegaly.  CV: RRR, no edema Chest: CTAB, no wheeze or crackles. Good air movement, normal resp effort.  Abd: Soft. NTND. BS present Skin: WWW. skin intact.  Neuro: Normal gait. PERLA. EOMi. Alert. Oriented x3  Psych: Normal affect, dress and demeanor. Normal speech. Normal thought content and judgment.  Assessment/Plan: Debra Sanchez is a 75 y.o. female present for OV for  Hypothyroidism, unspecified type - refills will be called in after results received  - TSH  Essential hypertension - stable. Discussed fatigue could be secondary to lower end BP. Pt to maintain adequate hydration and monitor BP.  - If remains lower end normal, could consider DC amlodipine.  - F/U 6 months if remains stable, sooner if medication adjustment needed.   Insomnia, unspecified type Refills on zoloft today, pt encouraged to take 100 mg QHS. She has cut back to 50 mg intermittently.   Status post laparoscopic Nissen fundoplication - discussed pt will need to follow with her GI concerning this matter. If she desires she can call in to have 2nd opinion referral placed, if  needed, and we will completed this for her.   electronically signed by:  Howard Pouch, DO  [facility] - [place]    '

## 2016-02-02 ENCOUNTER — Telehealth: Payer: Self-pay | Admitting: Family Medicine

## 2016-02-02 DIAGNOSIS — K449 Diaphragmatic hernia without obstruction or gangrene: Secondary | ICD-10-CM

## 2016-02-02 NOTE — Telephone Encounter (Signed)
I told her if she desired we could refer her for second opinion. Placed today I will place this referral today. They will likely need records from her current surgeon Dr. Zenaida Niece. Pt underwent a Large type III mixed hiatal hernia repair by Nissen fundoplication on [id number], which she states she is having complications and seeking second opinion.

## 2016-02-02 NOTE — Telephone Encounter (Signed)
Patient calling to request referral to [facility] for hiatal hernia.

## 2016-02-02 NOTE — Telephone Encounter (Signed)
Spoke with patient reviewed results and instructions. 

## 2016-02-03 NOTE — Telephone Encounter (Signed)
Left detailed message with information on patient voice mail per DPR. 

## 2016-02-04 ENCOUNTER — Telehealth: Payer: Self-pay | Admitting: Family Medicine

## 2016-02-04 NOTE — Telephone Encounter (Signed)
Left message for patient we did not Rx this medication it is Rx'd by GI. Patient needs to contact them or have mail order pharmacy call [facility] to get current Rx transferred.

## 2016-02-04 NOTE — Telephone Encounter (Signed)
Patient called stating that [facility] never received the pantoprazole.  Please sent [facility] to [facility].  Patient states she does not use [facility] any longer.

## 2016-02-11 DIAGNOSIS — L03031 Cellulitis of right toe: Secondary | ICD-10-CM | POA: Diagnosis not present

## 2016-02-11 DIAGNOSIS — L600 Ingrowing nail: Secondary | ICD-10-CM | POA: Diagnosis not present

## 2016-02-11 DIAGNOSIS — M79674 Pain in right toe(s): Secondary | ICD-10-CM | POA: Diagnosis not present

## 2016-02-11 DIAGNOSIS — M79671 Pain in right foot: Secondary | ICD-10-CM | POA: Diagnosis not present

## 2016-02-22 ENCOUNTER — Other Ambulatory Visit (INDEPENDENT_AMBULATORY_CARE_PROVIDER_SITE_OTHER): Payer: Self-pay | Admitting: Internal Medicine

## 2016-02-25 ENCOUNTER — Ambulatory Visit (INDEPENDENT_AMBULATORY_CARE_PROVIDER_SITE_OTHER): Payer: Medicare Other | Admitting: Internal Medicine

## 2016-02-25 DIAGNOSIS — M79671 Pain in right foot: Secondary | ICD-10-CM | POA: Diagnosis not present

## 2016-02-25 DIAGNOSIS — L600 Ingrowing nail: Secondary | ICD-10-CM | POA: Diagnosis not present

## 2016-02-25 DIAGNOSIS — M79674 Pain in right toe(s): Secondary | ICD-10-CM | POA: Diagnosis not present

## 2016-02-25 DIAGNOSIS — L03031 Cellulitis of right toe: Secondary | ICD-10-CM | POA: Diagnosis not present

## 2016-03-18 NOTE — Progress Notes (Signed)
Pt is being scheduled for preop appt; please place surgical orders in epic. Thanks.  

## 2016-04-07 ENCOUNTER — Encounter (HOSPITAL_COMMUNITY)
Admission: RE | Admit: 2016-04-07 | Discharge: 2016-04-07 | Disposition: A | Discharge status: Home / Self Care | Payer: Medicare Other | Source: Ambulatory Visit | Attending: Surgery | Admitting: Surgery

## 2016-04-12 IMAGING — RF DG UGI W/ HIGH DENSITY W/KUB
19 of 24 series · 19 of 24 positions shown · non-contrast
Comparison: None

CLINICAL DATA: Epigastric pain when eating, early satiety, gets
sick the stomach every morning for past 2 weeks, pain began shortly
after taking antibiotics for pneumonia, has noticed blood in stool,
black in color

EXAM:
UPPER GI SERIES WITH KUB
TECHNIQUE: After obtaining a scout radiograph a routine upper GI series was
performed using thin and high density barium.
FLUOROSCOPY TIME:  Radiation Exposure Index (as provided by the
fluoroscopic device): Not provided
If the device does not provide the exposure index:
Fluoroscopy Time (in minutes and seconds):  2 minutes 30 seconds
Number of Acquired Images: 3 plus multiple screen captures during
fluoroscopy

[Series 1: run · 1 of 1 slices shown (1 of 19)]
[im 1/1]
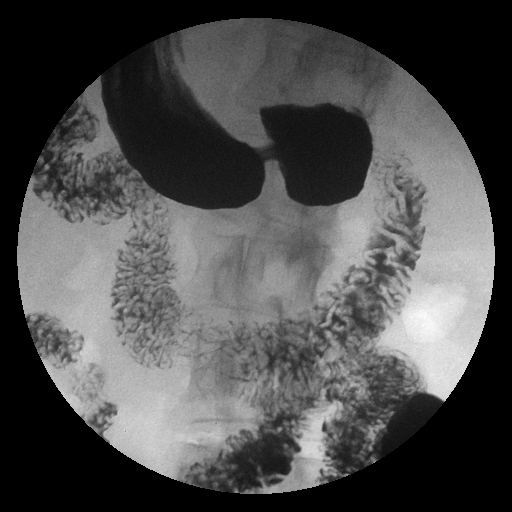

[Series 2: run · 1 of 1 slices shown (2 of 19)]
[im 1/1]
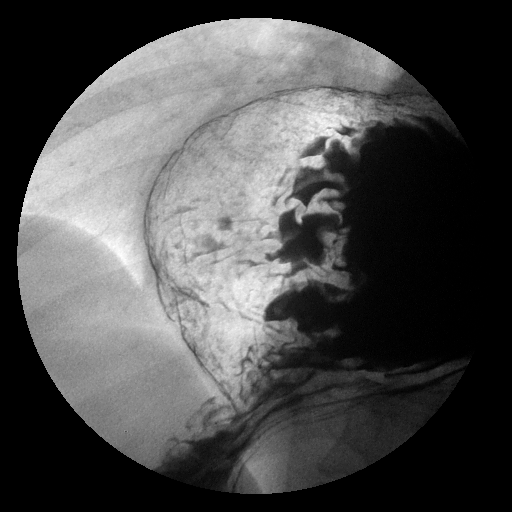

[Series 4: run · 1 of 1 slices shown (3 of 19)]
[im 1/1]
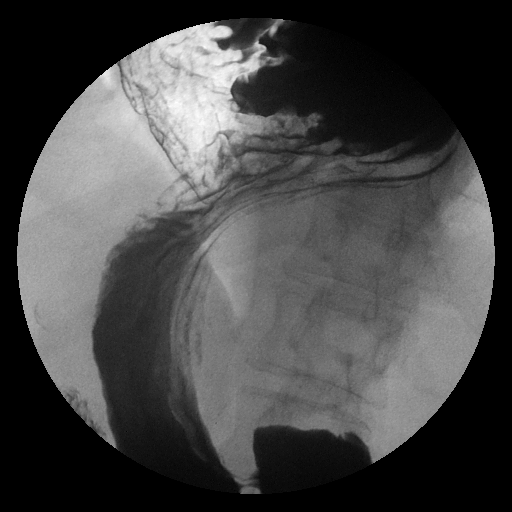

[Series 5: run · 1 of 1 slices shown (4 of 19)]
[im 1/1]
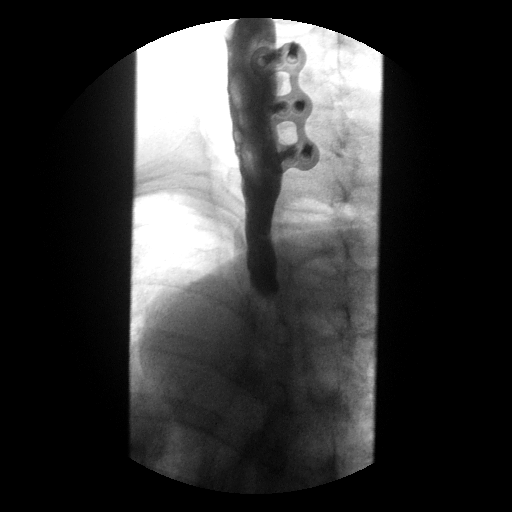

[Series 6: run · 1 of 1 slices shown (5 of 19)]
[im 1/1]
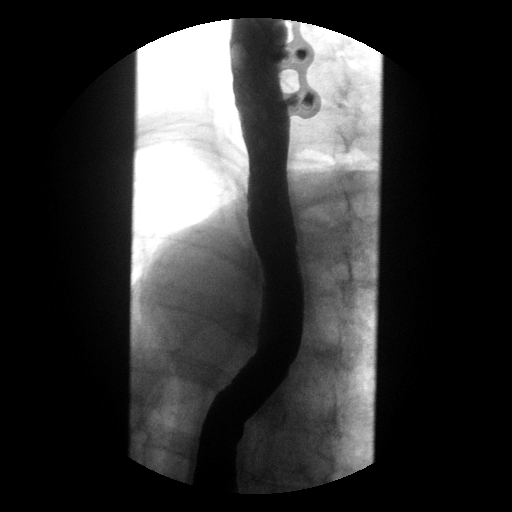

[Series 7: run · 1 of 1 slices shown (6 of 19)]
[im 1/1]
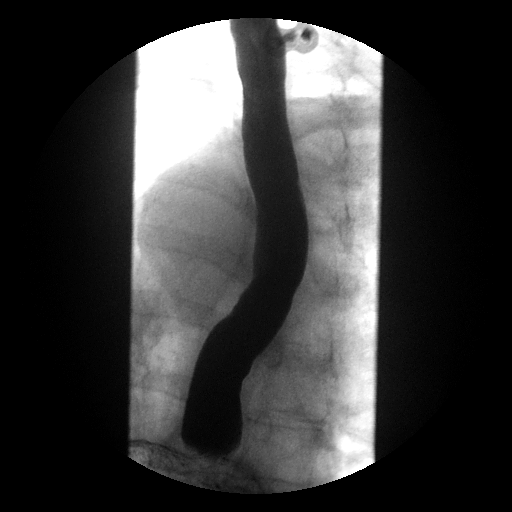

[Series 9: run · 1 of 1 slices shown (7 of 19)]
[im 1/1]
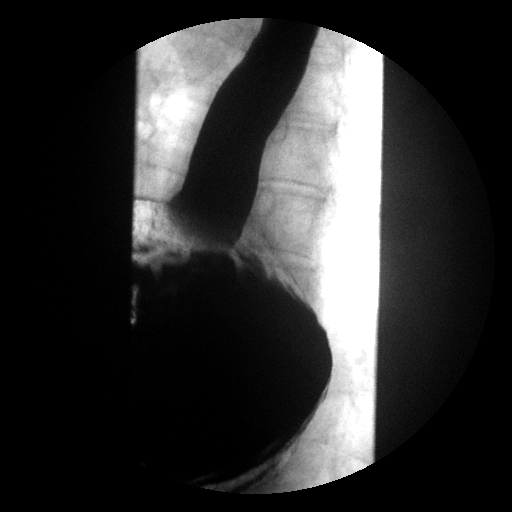

[Series 10: run · 1 of 1 slices shown (8 of 19)]
[im 1/1]
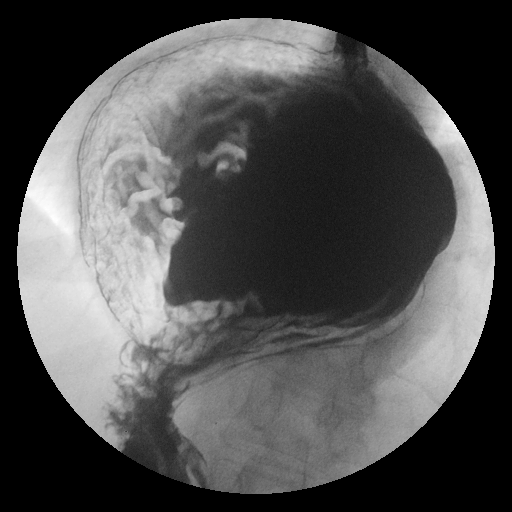

[Series 11: run · 1 of 1 slices shown (9 of 19)]
[im 1/1]
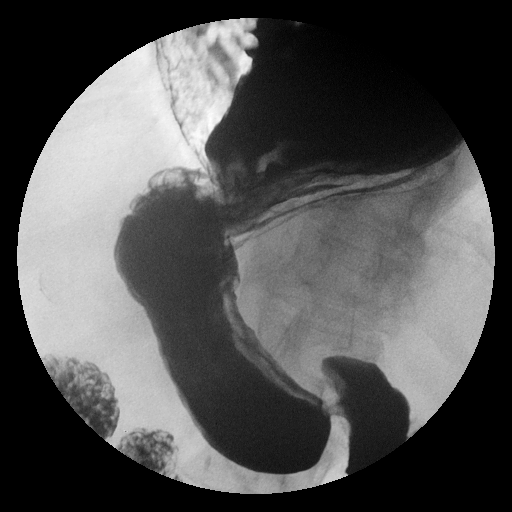

[Series 13: run · 1 of 1 slices shown (10 of 19)]
[im 1/1]
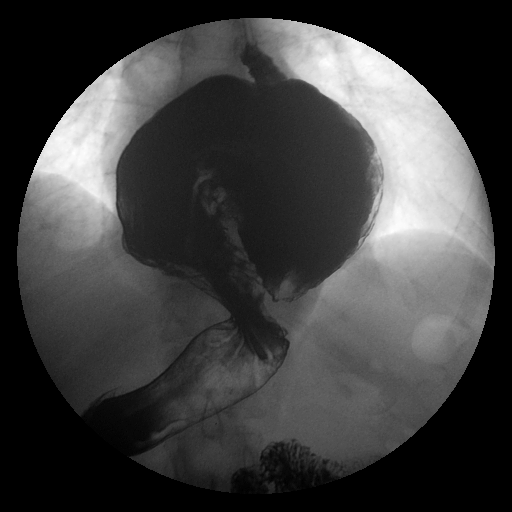

[Series 14: run · 1 of 1 slices shown (11 of 19)]
[im 1/1]
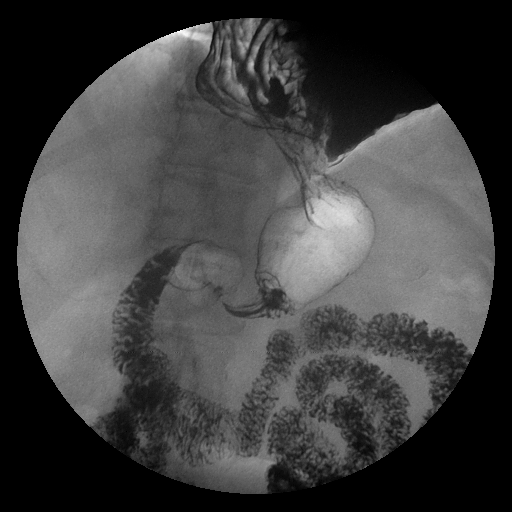

[Series 15: run · 1 of 1 slices shown (12 of 19)]
[im 1/1]
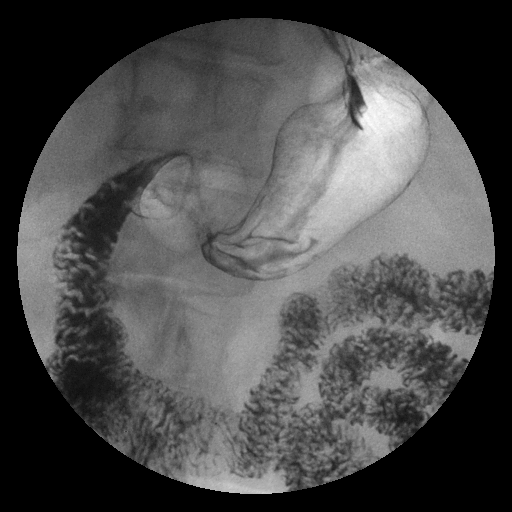

[Series 16: run · 1 of 1 slices shown (13 of 19)]
[im 1/1]
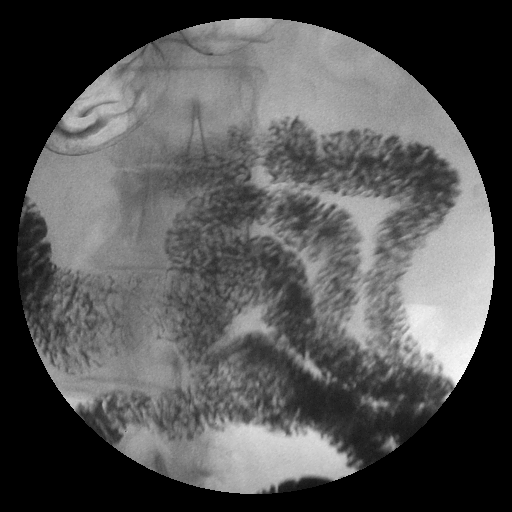

[Series 18: run · 1 of 1 slices shown (14 of 19)]
[im 1/1]
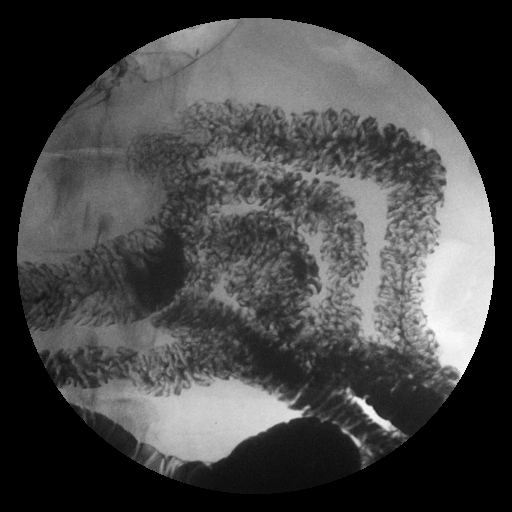

[Series 19: run · 1 of 1 slices shown (15 of 19)]
[im 1/1]
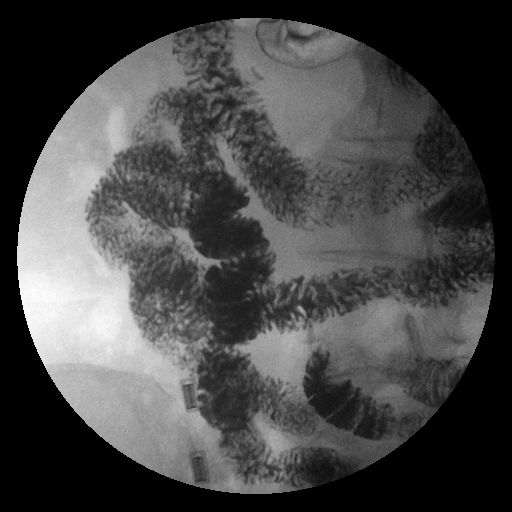

[Series 20: run · 1 of 1 slices shown (16 of 19)]
[im 1/1]
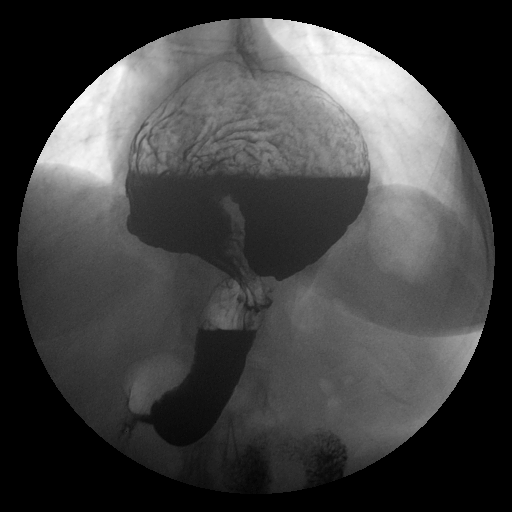

[Series 21: run · 1 of 1 slices shown (17 of 19)]
[im 1/1]
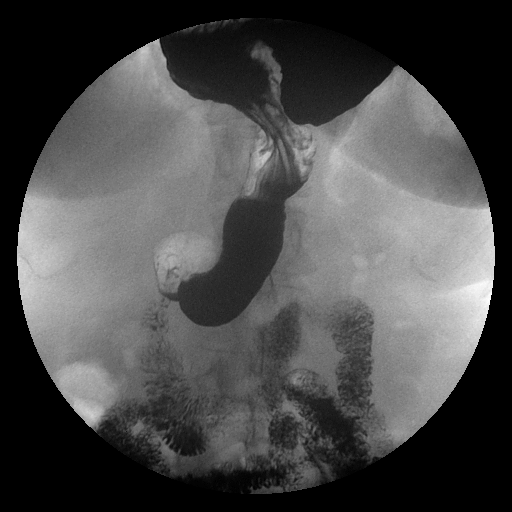

[Series 23: run · 1 of 1 slices shown (18 of 19)]
[im 1/1]
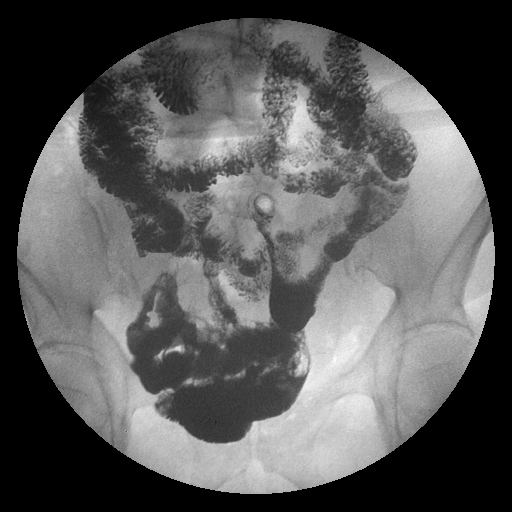

[Series 24: run · 1 of 1 slices shown (19 of 19)]
[im 1/1]
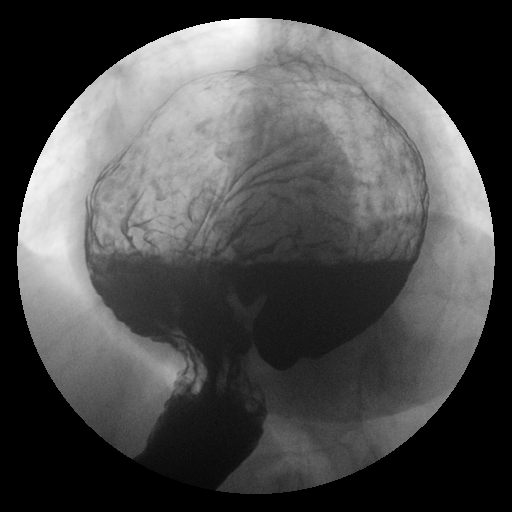

[19 of 24 positions shown; findings below may reference images not displayed]

FINDINGS: Normal bowel gas pattern on scout image.

Mild biconvex thoracolumbar scoliosis.

Normal esophageal distention without mass or stricture.

Diffuse age-related impairment of esophageal motility.

Very large hiatal hernia estimated 60- 70% of stomach within
inferior hemi thorax.

Relatively poor clearance of contrast from the proximal stomach into
the distal stomach and duodenum due to size of hiatal hernia and
partial rotation of the hiatal hernia, greater curvature portion
directed cranial.

Rugal folds grossly normal appearance without definite mass or
ulceration.

Pylorus patent.

Duodenal bulb and sweep normal appearance.

Mucosal folds of the duodenum and jejunum are normal in appearance.

No duodenal ulcer or post ulcer disease duodenal deformity is
identified.

Ligament of Treitz normal position.
IMPRESSION: Large hiatal hernia with estimated 60-70% of stomach within the
inferior hemi thorax.

Mildly delayed outflow of contrast from the hiatal hernia into the
distal stomach and small bowel due to size of hiatal hernia and to
flipped orientation of the hiatal hernia, with greater curvature
directed cranially.

## 2016-04-14 ENCOUNTER — Encounter (HOSPITAL_COMMUNITY): Admission: RE | Payer: Self-pay | Source: Ambulatory Visit

## 2016-04-14 ENCOUNTER — Inpatient Hospital Stay (HOSPITAL_COMMUNITY): Admission: RE | Admit: 2016-04-14 | Payer: Medicare Other | Source: Ambulatory Visit | Admitting: Surgery

## 2016-04-14 SURGERY — FUNDOPLICATION, NISSEN, LAPAROSCOPIC
Anesthesia: General

## 2016-05-03 DIAGNOSIS — K449 Diaphragmatic hernia without obstruction or gangrene: Secondary | ICD-10-CM | POA: Diagnosis not present

## 2016-05-11 ENCOUNTER — Other Ambulatory Visit (INDEPENDENT_AMBULATORY_CARE_PROVIDER_SITE_OTHER): Payer: Self-pay | Admitting: Internal Medicine

## 2016-05-11 ENCOUNTER — Other Ambulatory Visit: Payer: Self-pay | Admitting: Family Medicine

## 2016-05-11 NOTE — Telephone Encounter (Signed)
Patient will need to have a OV prior to further refills or she may get from her PCP.

## 2016-06-01 ENCOUNTER — Encounter (INDEPENDENT_AMBULATORY_CARE_PROVIDER_SITE_OTHER): Payer: Self-pay

## 2016-06-01 ENCOUNTER — Encounter (INDEPENDENT_AMBULATORY_CARE_PROVIDER_SITE_OTHER): Payer: Self-pay | Admitting: Internal Medicine

## 2016-06-01 NOTE — Progress Notes (Signed)
Please place orders in EPIC as patient has a Pre-op appointment on 06/08/2016 at 100 pm! Thank you!

## 2016-06-01 NOTE — Telephone Encounter (Signed)
Patient was given an appointment for 06/13/16 at 10:15am with Deberah Castle, NP.  A letter was mailed to the patient.

## 2016-06-03 NOTE — Progress Notes (Signed)
Please place orders in EPIC as patient has a Pre-op appointment on 06/08/2016 at 1 pm! Thank you!

## 2016-06-07 ENCOUNTER — Other Ambulatory Visit (HOSPITAL_COMMUNITY): Payer: Self-pay | Admitting: *Deleted

## 2016-06-07 NOTE — Patient Instructions (Signed)
Debra Sanchez  06/07/2016   Your procedure is scheduled on: 06-17-16  Report to [facility] Main  Entrance take [facility]  elevators to 3rd floor to  [place] at 530  AM.  Call this number if you have problems the morning of surgery [phone]   Remember: ONLY 1 PERSON MAY GO WITH YOU TO SHORT STAY TO GET  READY MORNING OF [place].  Do not eat food or drink liquids :After Midnight.     Take these medicines the morning of surgery with A SIP OF WATER: AMLODIPINE (NORVASC), LEVOTHYROXINE (SYNTHROID), PANTAPRAZOLE (PROTONIX)                               You may not have any metal on your body including hair pins and              piercings  Do not wear jewelry, make-up, lotions, powders or perfumes, deodorant             Do not wear nail polish.  Do not shave  48 hours prior to surgery.              Men may shave face and neck.   Do not bring valuables to the hospital. [facility].  Contacts, dentures or bridgework may not be worn into surgery.  Leave suitcase in the car. After surgery it may be brought to your room.                  Please read over the following fact sheets you were given: _____________________________________________________________________             [facility] - Preparing for Surgery Before surgery, you can play an important role.  Because skin is not sterile, your skin needs to be as free of germs as possible.  You can reduce the number of germs on your skin by washing with CHG (chlorahexidine gluconate) soap before surgery.  CHG is an antiseptic cleaner which kills germs and bonds with the skin to continue killing germs even after washing. Please DO NOT use if you have an allergy to CHG or antibacterial soaps.  If your skin becomes reddened/irritated stop using the CHG and inform your nurse when you arrive at Short Stay. Do not shave (including legs and underarms) for at  least 48 hours prior to the first CHG shower.  You may shave your face/neck. Please follow these instructions carefully:  1.  Shower with CHG Soap the night before surgery and the  morning of Surgery.  2.  If you choose to wash your hair, wash your hair first as usual with your  normal  shampoo.  3.  After you shampoo, rinse your hair and body thoroughly to remove the  shampoo.                           4.  Use CHG as you would any other liquid soap.  You can apply chg directly  to the skin and wash                       Gently with a scrungie or clean washcloth.  5.  Apply the CHG Soap to your body ONLY FROM THE NECK DOWN.   Do not use on face/ open                           Wound or open sores. Avoid contact with eyes, ears mouth and genitals (private parts).                       Wash face,  Genitals (private parts) with your normal soap.             6.  Wash thoroughly, paying special attention to the area where your surgery  will be performed.  7.  Thoroughly rinse your body with warm water from the neck down.  8.  DO NOT shower/wash with your normal soap after using and rinsing off  the CHG Soap.                9.  Pat yourself dry with a clean towel.            10.  Wear clean pajamas.            11.  Place clean sheets on your bed the night of your first shower and do not  sleep with pets. Day of Surgery : Do not apply any lotions/deodorants the morning of surgery.  Please wear clean clothes to the hospital/surgery center.  FAILURE TO FOLLOW THESE INSTRUCTIONS MAY RESULT IN THE CANCELLATION OF YOUR SURGERY PATIENT SIGNATURE_________________________________  NURSE SIGNATURE__________________________________  ________________________________________________________________________

## 2016-06-07 NOTE — Progress Notes (Signed)
ECHO 05-28-14 EPIC  EKG 08-20-15 EPIC

## 2016-06-08 ENCOUNTER — Encounter (HOSPITAL_COMMUNITY)
Admission: RE | Admit: 2016-06-08 | Discharge: 2016-06-08 | Disposition: A | Discharge status: Home / Self Care | Payer: Medicare Other | Source: Ambulatory Visit | Attending: Surgery | Admitting: Surgery

## 2016-06-08 ENCOUNTER — Encounter (HOSPITAL_COMMUNITY): Payer: Self-pay

## 2016-06-08 DIAGNOSIS — Z01812 Encounter for preprocedural laboratory examination: Secondary | ICD-10-CM | POA: Insufficient documentation

## 2016-06-08 HISTORY — DX: Pneumonia, unspecified organism: J18.9

## 2016-06-08 LAB — CBC
HCT: 37.3 % (ref 36.0–46.0)
Hemoglobin: 12.3 g/dL (ref 12.0–15.0)
MCH: 29.9 pg (ref 26.0–34.0)
MCHC: 33 g/dL (ref 30.0–36.0)
MCV: 90.5 fL (ref 78.0–100.0)
Platelets: 223 10*3/uL (ref 150–400)
RBC: 4.12 MIL/uL (ref 3.87–5.11)
RDW: 13.3 % (ref 11.5–15.5)
WBC: 6.4 10*3/uL (ref 4.0–10.5)

## 2016-06-08 LAB — BASIC METABOLIC PANEL
Anion gap: 5 (ref 5–15)
BUN: 13 mg/dL (ref 6–20)
CO2: 27 mmol/L (ref 22–32)
Calcium: 9.4 mg/dL (ref 8.9–10.3)
Chloride: 104 mmol/L (ref 101–111)
Creatinine, Ser: 0.85 mg/dL (ref 0.44–1.00)
GFR calc Af Amer: >60 mL/min (ref >60)
GFR calc non Af Amer: >60 mL/min (ref >60)
Glucose, Bld: 90 mg/dL (ref 65–99)
Potassium: 4.1 mmol/L (ref 3.5–5.1)
Sodium: 136 mmol/L (ref 135–145)

## 2016-06-13 ENCOUNTER — Ambulatory Visit (INDEPENDENT_AMBULATORY_CARE_PROVIDER_SITE_OTHER): Payer: Medicare Other | Admitting: Internal Medicine

## 2016-06-15 ENCOUNTER — Ambulatory Visit: Payer: Self-pay | Admitting: Surgery

## 2016-06-15 NOTE — H&P (Signed)
Chief Complaint:  Recurrent GERD  History of Present Illness:  Debra Sanchez is an 76 y.o. 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  This was performed by me with a 3 suture repair of the hiatus and Nissen around a 56 lighted bougie.  With the first several weeks after surgery, she contracted a "virus" from her grandchildren and had some forceful vomiting.  After that she had return of her GER symptoms.  Repeat UGI showed herniation above the wrap.  She presents for redo Nissen fundoplication.   Past Medical History:  Diagnosis Date  . Anemia   . Anxiety   . Arthritis   . Blood transfusion 2012  . Colon polyp   . DDD (degenerative disc disease), thoracolumbar   . Degenerative disc disease, lumbar   . Family history of adverse reaction to anesthesia    pts sister got very sick / burnt esophagus   . Gastric ulcer   . GERD (gastroesophageal reflux disease)   . Heart murmur   . History of hiatal hernia   . Hypertension   . Hypothyroidism   . Migraines   . Miscarriage   . Osteoporosis   . Pneumonia yrs ago  . Syncope 2012  . Wears glasses     Past Surgical History:  Procedure Laterality Date  . ABDOMINAL HYSTERECTOMY    . BILATERAL OOPHORECTOMY    . CATARACT EXTRACTION W/PHACO Left 10/20/2014   Procedure: CATARACT EXTRACTION PHACO AND INTRAOCULAR LENS PLACEMENT LEFT EYE;  Surgeon: Tonny Branch, MD;  Location: [facility];  Service: Ophthalmology;  Laterality: Left;  CDE:6.98  . CATARACT EXTRACTION W/PHACO Right 11/17/2014   Procedure: CATARACT EXTRACTION PHACO AND INTRAOCULAR LENS PLACEMENT RIGHT EYE CDE=6.57;  Surgeon: Tonny Branch, MD;  Location: [facility];  Service: Ophthalmology;  Laterality: Right;  . COLONOSCOPY  01/19/2011   Procedure: COLONOSCOPY;  Surgeon: Rogene Houston, MD;  Location: [facility];  Service: Endoscopy;  Laterality: N/A;  2:00  . DILATION AND CURETTAGE OF UTERUS    . ESOPHAGOGASTRODUODENOSCOPY  11/05/2010    Procedure: ESOPHAGOGASTRODUODENOSCOPY (EGD);  Surgeon: Rogene Houston, MD;  [facility];  Service: Endoscopy;  Laterality: N/A;  7:00 am per [name]  . ESOPHAGOGASTRODUODENOSCOPY N/A 07/25/2012   Procedure: ESOPHAGOGASTRODUODENOSCOPY (EGD);  Surgeon: Rogene Houston, MD;  [facility];  Service: Endoscopy;  Laterality: N/A;  400-moved to 102 Ann notified pt  . ESOPHAGOGASTRODUODENOSCOPY N/A 05/27/2015   Procedure: ESOPHAGOGASTRODUODENOSCOPY (EGD);  Surgeon: Rogene Houston, MD;  [facility]: [facility];  Service: Endoscopy;  Laterality: N/A;  2:00  . HIATAL HERNIA REPAIR N/A 08/25/2015   Procedure: LAPAROSCOPIC REPAIR OF LARGE TYPE III  HIATAL HERNIA;  Surgeon: Johnathan Hausen, MD;  Location: [facility];  Service: General;  Laterality: N/A;  . NECK SURGERY     cervical disc  . 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 acetaminophen (TYLENOL) 325 MG tablet Take 325 mg by mouth every 6 (six) hours as needed for mild pain (For pain.).     Marland Kitchen amLODipine (NORVASC) 2.5 MG tablet TAKE 1 TABLET BY MOUTH  DAILY 90 tablet 0  . CALCIUM PO Take 1 tablet by mouth daily.    . Cholecalciferol (VITAMIN D PO) Take 1 tablet by mouth daily.    . Cyanocobalamin (B-12  PO) Take 1 tablet by mouth daily.     Marland Kitchen levothyroxine (SYNTHROID, LEVOTHROID) 50 MCG tablet Take 1 tablet (50 mcg total) by mouth daily. 90 tablet 3  . losartan (COZAAR) 100 MG tablet TAKE TABLET BY MOUTH ONCE  DAILY. 90 tablet 0  . Melatonin 5 MG TABS Take 5 mg by mouth at bedtime.    . Multiple Vitamin (MULTIVITAMIN) LIQD Take 5 mLs by mouth daily after lunch.    . ondansetron (ZOFRAN-ODT) 4 MG disintegrating tablet Take 1 tablet (4 mg total) by mouth every 6 (six) hours as needed for nausea. (Patient not taking: Reported on 03/31/2016) 20 tablet 0  . pantoprazole (PROTONIX) 40 MG tablet TAKE 1 TABLET BY  MOUTH  TWICE A DAY BEFORE MEALS 180 tablet 1  . sertraline (ZOLOFT) 100 MG tablet TAKE 1 TABLET BY MOUTH AT  BEDTIME 90 tablet 0  . SUMAtriptan (IMITREX) 100 MG tablet Take 1 tablet (100 mg total) by mouth every 2 (two) hours as needed for migraine. May repeat in 2 hours if headache persists or recurs. 10 tablet 2   No current facility-administered medications for this visit.    Codeine; Latex; Morphine and related; Penicillins; and Sulfa antibiotics Family History  Problem Relation Age of Onset  . Colon cancer Mother   . Heart disease Mother   . Arthritis Mother   . Heart disease Father   . Arthritis Father   . Arthritis Sister   . Arthritis Brother   . Arthritis Maternal Aunt   . Arthritis Maternal Uncle   . Diabetes Maternal Uncle    Social History:   reports that she has never smoked. She has never used smokeless tobacco. She reports that she does not drink alcohol or use drugs.   REVIEW OF SYSTEMS : Negative except for see problem list  Physical Exam:   There were no vitals taken for this visit. There is no height or weight on file to calculate BMI.  Gen:  WDWN WF NAD  Neurological: Alert and oriented to person, place, and time. Motor and sensory function is grossly intact  Head: Normocephalic and atraumatic.  Eyes: Conjunctivae are normal. Pupils are equal, round, and reactive to light. No scleral icterus.  Neck: Normal range of motion. Neck supple. No tracheal deviation or thyromegaly present.  Cardiovascular:  SR without murmurs or gallops.  No carotid bruits Breast:  Not examined  Respiratory: Effort normal.  No respiratory distress. No chest wall tenderness. Breath sounds normal.  No wheezes, rales or rhonchi.  Abdomen:  nontender GU:  Not examined Musculoskeletal: Normal range of motion. Extremities are nontender. No cyanosis, edema or clubbing noted Lymphadenopathy: No cervical, preauricular, postauricular or axillary adenopathy is present Skin: Skin is warm and  dry. No rash noted. No diaphoresis. No erythema. No pallor. Pscyh: Normal mood and affect. Behavior is normal. Judgment and thought content normal.   LABORATORY RESULTS: No results found for this or any previous visit (from the past 48 hour(s)).   RADIOLOGY RESULTS: No results found.  Problem List: Patient Active Problem List   Diagnosis Date Noted  . Status post laparoscopic Nissen fundoplication 16/60/6301  . Hiatal hernia 07/20/2015  . Pulmonary nodules 05/12/2015  . Insomnia 04/20/2015  . Melena 07/19/2012  . Hypertension 05/15/2012  . Hypothyroidism 05/15/2012  . History of colonic polyps 05/15/2012  . Anemia 05/15/2012  . Upper GI bleed 05/15/2012  . Heme positive stool 05/15/2012    Assessment & Plan: Recurrent GER with Nissen breakdown.  Plan  redo Nissen fundoplication.      Matt B. Hassell Done, MD, [facility] [phone] beeper [phone]  06/15/2016 10:37 AM

## 2016-06-17 ENCOUNTER — Inpatient Hospital Stay (HOSPITAL_COMMUNITY): Payer: Medicare Other | Admitting: Anesthesiology

## 2016-06-17 ENCOUNTER — Encounter (HOSPITAL_COMMUNITY): Payer: Self-pay | Admitting: *Deleted

## 2016-06-17 ENCOUNTER — Inpatient Hospital Stay (HOSPITAL_COMMUNITY)
Admission: RE | Admit: 2016-06-17 | Discharge: 2016-06-19 | DRG: 328 | Disposition: A | Discharge status: Home / Self Care | Payer: Medicare Other | Source: Ambulatory Visit | Attending: Surgery | Admitting: Surgery

## 2016-06-17 ENCOUNTER — Encounter (HOSPITAL_COMMUNITY)
Admission: RE | Disposition: A | Discharge status: Home / Self Care | Payer: Self-pay | Source: Ambulatory Visit | Attending: Surgery

## 2016-06-17 DIAGNOSIS — K219 Gastro-esophageal reflux disease without esophagitis: Secondary | ICD-10-CM | POA: Diagnosis not present

## 2016-06-17 DIAGNOSIS — K449 Diaphragmatic hernia without obstruction or gangrene: Secondary | ICD-10-CM | POA: Diagnosis not present

## 2016-06-17 DIAGNOSIS — E039 Hypothyroidism, unspecified: Secondary | ICD-10-CM | POA: Diagnosis not present

## 2016-06-17 DIAGNOSIS — Z8711 Personal history of peptic ulcer disease: Secondary | ICD-10-CM

## 2016-06-17 DIAGNOSIS — I10 Essential (primary) hypertension: Secondary | ICD-10-CM | POA: Diagnosis present

## 2016-06-17 DIAGNOSIS — K922 Gastrointestinal hemorrhage, unspecified: Secondary | ICD-10-CM | POA: Diagnosis not present

## 2016-06-17 DIAGNOSIS — R1310 Dysphagia, unspecified: Secondary | ICD-10-CM | POA: Diagnosis not present

## 2016-06-17 DIAGNOSIS — G43909 Migraine, unspecified, not intractable, without status migrainosus: Secondary | ICD-10-CM | POA: Diagnosis present

## 2016-06-17 DIAGNOSIS — F419 Anxiety disorder, unspecified: Secondary | ICD-10-CM | POA: Diagnosis present

## 2016-06-17 HISTORY — PX: LAPAROSCOPIC NISSEN FUNDOPLICATION: SHX1932

## 2016-06-17 LAB — CBC
HCT: 35.2 % — ABNORMAL LOW (ref 36.0–46.0)
Hemoglobin: 11.7 g/dL — ABNORMAL LOW (ref 12.0–15.0)
MCH: 30.2 pg (ref 26.0–34.0)
MCHC: 33.2 g/dL (ref 30.0–36.0)
MCV: 90.7 fL (ref 78.0–100.0)
Platelets: 179 10*3/uL (ref 150–400)
RBC: 3.88 MIL/uL (ref 3.87–5.11)
RDW: 13.5 % (ref 11.5–15.5)
WBC: 8.5 10*3/uL (ref 4.0–10.5)

## 2016-06-17 LAB — CREATININE, SERUM
Creatinine, Ser: 0.97 mg/dL (ref 0.44–1.00)
GFR calc Af Amer: >60 mL/min (ref >60)
GFR calc non Af Amer: 55 mL/min — ABNORMAL LOW (ref >60)

## 2016-06-17 SURGERY — FUNDOPLICATION, NISSEN, LAPAROSCOPIC
Anesthesia: General | Site: Abdomen

## 2016-06-17 MED ORDER — PROPOFOL 10 MG/ML IV BOLUS
INTRAVENOUS | Status: AC
  Filled 2016-06-17: qty 20

## 2016-06-17 MED ORDER — DEXAMETHASONE SODIUM PHOSPHATE 10 MG/ML IJ SOLN
INTRAMUSCULAR | Status: AC
  Filled 2016-06-17: qty 1

## 2016-06-17 MED ORDER — CEFAZOLIN SODIUM-DEXTROSE 2-4 GM/100ML-% IV SOLN
INTRAVENOUS | Status: AC
  Filled 2016-06-17: qty 100

## 2016-06-17 MED ORDER — ROCURONIUM BROMIDE 50 MG/5ML IV SOSY
PREFILLED_SYRINGE | INTRAVENOUS | Status: AC
  Filled 2016-06-17: qty 5

## 2016-06-17 MED ORDER — FENTANYL CITRATE (PF) 100 MCG/2ML IJ SOLN
INTRAMUSCULAR | Status: AC
  Filled 2016-06-17: qty 2

## 2016-06-17 MED ORDER — LIDOCAINE 2% (20 MG/ML) 5 ML SYRINGE
INTRAMUSCULAR | Status: DC | PRN
  Administered 2016-06-17: 100 mg via INTRAVENOUS

## 2016-06-17 MED ORDER — SUCCINYLCHOLINE CHLORIDE 200 MG/10ML IV SOSY
PREFILLED_SYRINGE | INTRAVENOUS | Status: AC
  Filled 2016-06-17: qty 10

## 2016-06-17 MED ORDER — ACETAMINOPHEN 500 MG PO TABS
1000.0000 mg | ORAL_TABLET | ORAL | Status: AC
  Administered 2016-06-17: 1000 mg via ORAL
  Filled 2016-06-17: qty 2

## 2016-06-17 MED ORDER — CHLORHEXIDINE GLUCONATE CLOTH 2 % EX PADS: 6.0000 | MEDICATED_PAD | Freq: Once | CUTANEOUS | Status: DC

## 2016-06-17 MED ORDER — ONDANSETRON 4 MG PO TBDP: 4.0000 mg | ORAL_TABLET | Freq: Four times a day (QID) | ORAL | Status: DC | PRN

## 2016-06-17 MED ORDER — DIPHENHYDRAMINE HCL 12.5 MG/5ML PO ELIX: 12.5000 mg | ORAL_SOLUTION | Freq: Four times a day (QID) | ORAL | Status: DC | PRN

## 2016-06-17 MED ORDER — AMLODIPINE BESYLATE 5 MG PO TABS
2.5000 mg | ORAL_TABLET | Freq: Every day | ORAL | Status: DC
  Administered 2016-06-18 – 2016-06-19 (×2): 2.5 mg via ORAL
  Filled 2016-06-17 (×2): qty 1

## 2016-06-17 MED ORDER — ONDANSETRON HCL 4 MG/2ML IJ SOLN
INTRAMUSCULAR | Status: AC
  Filled 2016-06-17: qty 2

## 2016-06-17 MED ORDER — SODIUM CHLORIDE 0.9 % IJ SOLN
INTRAMUSCULAR | Status: AC
  Filled 2016-06-17: qty 10

## 2016-06-17 MED ORDER — FENTANYL CITRATE (PF) 100 MCG/2ML IJ SOLN
INTRAMUSCULAR | Status: DC | PRN
  Administered 2016-06-17 (×3): 50 ug via INTRAVENOUS

## 2016-06-17 MED ORDER — DEXAMETHASONE SODIUM PHOSPHATE 10 MG/ML IJ SOLN
INTRAMUSCULAR | Status: DC | PRN
  Administered 2016-06-17: 10 mg via INTRAVENOUS

## 2016-06-17 MED ORDER — GABAPENTIN 300 MG PO CAPS
300.0000 mg | ORAL_CAPSULE | ORAL | Status: AC
  Administered 2016-06-17: 300 mg via ORAL
  Filled 2016-06-17: qty 1

## 2016-06-17 MED ORDER — SUGAMMADEX SODIUM 200 MG/2ML IV SOLN
INTRAVENOUS | Status: AC
  Filled 2016-06-17: qty 2

## 2016-06-17 MED ORDER — LIDOCAINE 2% (20 MG/ML) 5 ML SYRINGE
INTRAMUSCULAR | Status: AC
  Filled 2016-06-17: qty 5

## 2016-06-17 MED ORDER — KCL IN DEXTROSE-NACL 20-5-0.45 MEQ/L-%-% IV SOLN
INTRAVENOUS | Status: DC
  Administered 2016-06-17 – 2016-06-19 (×5): via INTRAVENOUS
  Filled 2016-06-17 (×5): qty 1000

## 2016-06-17 MED ORDER — PANTOPRAZOLE SODIUM 40 MG IV SOLR
40.0000 mg | Freq: Every day | INTRAVENOUS | Status: DC
  Administered 2016-06-17 – 2016-06-18 (×2): 40 mg via INTRAVENOUS
  Filled 2016-06-17 (×2): qty 40

## 2016-06-17 MED ORDER — CEFAZOLIN SODIUM-DEXTROSE 2-4 GM/100ML-% IV SOLN
2.0000 g | INTRAVENOUS | Status: AC
  Administered 2016-06-17: 2 g via INTRAVENOUS

## 2016-06-17 MED ORDER — SODIUM CHLORIDE 0.9 % IJ SOLN
INTRAMUSCULAR | Status: DC | PRN
  Administered 2016-06-17: 10 mL

## 2016-06-17 MED ORDER — ROCURONIUM BROMIDE 10 MG/ML (PF) SYRINGE
PREFILLED_SYRINGE | INTRAVENOUS | Status: DC | PRN
Start: 2016-06-17 — End: 2016-06-17
  Administered 2016-06-17: 20 mg via INTRAVENOUS
  Administered 2016-06-17: 50 mg via INTRAVENOUS
  Administered 2016-06-17: 10 mg via INTRAVENOUS
  Administered 2016-06-17: 20 mg via INTRAVENOUS

## 2016-06-17 MED ORDER — HEPARIN SODIUM (PORCINE) 5000 UNIT/ML IJ SOLN
5000.0000 [IU] | Freq: Once | INTRAMUSCULAR | Status: AC
  Administered 2016-06-17: 5000 [IU] via SUBCUTANEOUS
  Filled 2016-06-17: qty 1

## 2016-06-17 MED ORDER — HEPARIN SODIUM (PORCINE) 5000 UNIT/ML IJ SOLN
5000.0000 [IU] | Freq: Three times a day (TID) | INTRAMUSCULAR | Status: DC
  Administered 2016-06-17 – 2016-06-19 (×6): 5000 [IU] via SUBCUTANEOUS
  Filled 2016-06-17 (×6): qty 1

## 2016-06-17 MED ORDER — ONDANSETRON HCL 4 MG/2ML IJ SOLN
4.0000 mg | Freq: Four times a day (QID) | INTRAMUSCULAR | Status: DC | PRN
  Administered 2016-06-18: 4 mg via INTRAVENOUS
  Filled 2016-06-17: qty 2

## 2016-06-17 MED ORDER — FENTANYL CITRATE (PF) 100 MCG/2ML IJ SOLN
12.5000 ug | INTRAMUSCULAR | Status: DC | PRN
  Administered 2016-06-17 – 2016-06-19 (×12): 12.5 ug via INTRAVENOUS
  Filled 2016-06-17 (×12): qty 2

## 2016-06-17 MED ORDER — CELECOXIB 200 MG PO CAPS
400.0000 mg | ORAL_CAPSULE | ORAL | Status: AC
  Administered 2016-06-17: 400 mg via ORAL
  Filled 2016-06-17: qty 2

## 2016-06-17 MED ORDER — LACTATED RINGERS IV SOLN
INTRAVENOUS | Status: DC | PRN
  Administered 2016-06-17 (×3): via INTRAVENOUS

## 2016-06-17 MED ORDER — EPHEDRINE SULFATE-NACL 50-0.9 MG/10ML-% IV SOSY
PREFILLED_SYRINGE | INTRAVENOUS | Status: DC | PRN
  Administered 2016-06-17 (×3): 10 mg via INTRAVENOUS

## 2016-06-17 MED ORDER — SODIUM CHLORIDE 0.9 % IR SOLN
Status: DC | PRN
  Administered 2016-06-17: 1000 mL

## 2016-06-17 MED ORDER — PROPOFOL 10 MG/ML IV BOLUS
INTRAVENOUS | Status: DC | PRN
  Administered 2016-06-17: 170 mg via INTRAVENOUS

## 2016-06-17 MED ORDER — LACTATED RINGERS IR SOLN
Status: DC | PRN
  Administered 2016-06-17: 2000 mL

## 2016-06-17 MED ORDER — ONDANSETRON HCL 4 MG/2ML IJ SOLN
INTRAMUSCULAR | Status: DC | PRN
  Administered 2016-06-17: 4 mg via INTRAVENOUS

## 2016-06-17 MED ORDER — BUPIVACAINE LIPOSOME 1.3 % IJ SUSP
20.0000 mL | Freq: Once | INTRAMUSCULAR | Status: AC
  Administered 2016-06-17: 20 mL
  Filled 2016-06-17: qty 20

## 2016-06-17 MED ORDER — SUCCINYLCHOLINE CHLORIDE 200 MG/10ML IV SOSY
PREFILLED_SYRINGE | INTRAVENOUS | Status: DC | PRN
  Administered 2016-06-17: 120 mg via INTRAVENOUS

## 2016-06-17 MED ORDER — DIPHENHYDRAMINE HCL 50 MG/ML IJ SOLN
12.5000 mg | Freq: Four times a day (QID) | INTRAMUSCULAR | Status: DC | PRN
  Administered 2016-06-17 – 2016-06-19 (×3): 12.5 mg via INTRAVENOUS
  Filled 2016-06-17 (×3): qty 1

## 2016-06-17 SURGICAL SUPPLY — 49 items
ADH SKN CLS APL DERMABOND .7 (GAUZE/BANDAGES/DRESSINGS) ×1
APPLIER CLIP ROT 10 11.4 M/L (STAPLE)
APR CLP MED LRG 11.4X10 (STAPLE)
CABLE HIGH FREQUENCY MONO STRZ (ELECTRODE) ×3 IMPLANT
CATH FOLEY LATEX FREE 16FR (CATHETERS) ×2 IMPLANT
CLIP APPLIE ROT 10 11.4 M/L (STAPLE) IMPLANT
COVER SURGICAL LIGHT HANDLE (MISCELLANEOUS) ×3 IMPLANT
DECANTER SPIKE VIAL GLASS SM (MISCELLANEOUS) ×3 IMPLANT
DERMABOND ADVANCED (GAUZE/BANDAGES/DRESSINGS) ×2
DERMABOND ADVANCED .7 DNX12 (GAUZE/BANDAGES/DRESSINGS) IMPLANT
DEVICE SUT QUICK LOAD TK 5 (STAPLE) ×5 IMPLANT
DEVICE SUT TI-KNOT TK 5X26 (MISCELLANEOUS) ×2 IMPLANT
DEVICE SUTURE ENDOST 10MM (ENDOMECHANICALS) ×5 IMPLANT
DEVICE TI KNOT TK5 (MISCELLANEOUS) ×1
DEVICE TROCAR PUNCTURE CLOSURE (ENDOMECHANICALS) ×2 IMPLANT
DISSECTOR BLUNT TIP ENDO 5MM (MISCELLANEOUS) ×3 IMPLANT
DRAIN PENROSE 18X1/2 LTX STRL (DRAIN) ×3 IMPLANT
DRAPE LAPAROSCOPIC ABDOMINAL (DRAPES) ×3 IMPLANT
ELECT REM PT RETURN 9FT ADLT (ELECTROSURGICAL) ×3
ELECTRODE REM PT RTRN 9FT ADLT (ELECTROSURGICAL) ×1 IMPLANT
GLOVE BIOGEL M 8.0 STRL (GLOVE) ×3 IMPLANT
GOWN STRL REUS W/TWL XL LVL3 (GOWN DISPOSABLE) ×9 IMPLANT
GRASPER ENDO BABCOCK 10 (MISCELLANEOUS) IMPLANT
GRASPER ENDO BABCOCK 10MM (MISCELLANEOUS) ×3
KIT BASIN OR (CUSTOM PROCEDURE TRAY) ×3 IMPLANT
PAD POSITIONING PINK XL (MISCELLANEOUS) ×2 IMPLANT
POSITIONER SURGICAL ARM (MISCELLANEOUS) IMPLANT
QUICK LOAD TK 5 (STAPLE)
RELOAD ENDO STITCH (ENDOMECHANICALS) IMPLANT
RELOAD SUT TRIPLE-STITCH 2-0 (ENDOMECHANICALS) ×3 IMPLANT
SCISSORS LAP 5X45 EPIX DISP (ENDOMECHANICALS) ×3 IMPLANT
SET IRRIG TUBING LAPAROSCOPIC (IRRIGATION / IRRIGATOR) ×2 IMPLANT
SHEARS HARMONIC ACE PLUS 45CM (MISCELLANEOUS) ×3 IMPLANT
SLEEVE ADV FIXATION 5X100MM (TROCAR) ×6 IMPLANT
STAPLER VISISTAT 35W (STAPLE) ×3 IMPLANT
SUT SURGIDAC NAB ES-9 0 48 120 (SUTURE) ×11 IMPLANT
SUT VIC AB 4-0 SH 18 (SUTURE) ×1 IMPLANT
TAPE CLOTH 4X10 WHT NS (GAUZE/BANDAGES/DRESSINGS) IMPLANT
TIP INNERVISION DETACH 40FR (MISCELLANEOUS) IMPLANT
TIP INNERVISION DETACH 50FR (MISCELLANEOUS) IMPLANT
TIP INNERVISION DETACH 56FR (MISCELLANEOUS) IMPLANT
TIPS INNERVISION DETACH 40FR (MISCELLANEOUS)
TOWEL OR 17X26 10 PK STRL BLUE (TOWEL DISPOSABLE) ×6 IMPLANT
TOWEL OR NON WOVEN STRL DISP B (DISPOSABLE) ×3 IMPLANT
TRAY LAPAROSCOPIC (CUSTOM PROCEDURE TRAY) ×3 IMPLANT
TROCAR ADV FIXATION 11X100MM (TROCAR) ×3 IMPLANT
TROCAR ADV FIXATION 5X100MM (TROCAR) ×3 IMPLANT
TROCAR BLADELESS OPT 5 100 (ENDOMECHANICALS) ×3 IMPLANT
TUBING INSUF HEATED (TUBING) ×3 IMPLANT

## 2016-06-17 NOTE — Anesthesia Procedure Notes (Signed)
Procedure Name: Intubation Date/Time: 06/17/2016 7:31 AM Performed by: Lind Covert Pre-anesthesia Checklist: Patient identified, Emergency Drugs available, Suction available, Patient being monitored and Timeout performed Patient Re-evaluated:Patient Re-evaluated prior to inductionOxygen Delivery Method: Circle system utilized Preoxygenation: Pre-oxygenation with 100% oxygen Intubation Type: IV induction Laryngoscope Size: Mac, 3 and Glidescope Grade View: Grade I Tube type: Oral Tube size: 7.0 mm Number of attempts: 1 Airway Equipment and Method: Stylet and Video-laryngoscopy Placement Confirmation: ETT inserted through vocal cords under direct vision,  positive ETCO2 and breath sounds checked- equal and bilateral Secured at: 21 cm Tube secured with: Tape Dental Injury: Teeth and Oropharynx as per pre-operative assessment

## 2016-06-17 NOTE — H&P (View-Only) (Signed)
Chief Complaint:  Recurrent GERD  History of Present Illness:  Debra Sanchez is an 76 y.o. 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  This was performed by me with a 3 suture repair of the hiatus and Nissen around a 56 lighted bougie.  With the first several weeks after surgery, she contracted a "virus" from her grandchildren and had some forceful vomiting.  After that she had return of her GER symptoms.  Repeat UGI showed herniation above the wrap.  She presents for redo Nissen fundoplication.   Past Medical History:  Diagnosis Date  . Anemia   . Anxiety   . Arthritis   . Blood transfusion 2012  . Colon polyp   . DDD (degenerative disc disease), thoracolumbar   . Degenerative disc disease, lumbar   . Family history of adverse reaction to anesthesia    pts sister got very sick / burnt esophagus   . Gastric ulcer   . GERD (gastroesophageal reflux disease)   . Heart murmur   . History of hiatal hernia   . Hypertension   . Hypothyroidism   . Migraines   . Miscarriage   . Osteoporosis   . Pneumonia yrs ago  . Syncope 2012  . Wears glasses     Past Surgical History:  Procedure Laterality Date  . ABDOMINAL HYSTERECTOMY    . BILATERAL OOPHORECTOMY    . CATARACT EXTRACTION W/PHACO Left 10/20/2014   Procedure: CATARACT EXTRACTION PHACO AND INTRAOCULAR LENS PLACEMENT LEFT EYE;  Surgeon: Tonny Branch, MD;  Location: [facility];  Service: Ophthalmology;  Laterality: Left;  CDE:6.98  . CATARACT EXTRACTION W/PHACO Right 11/17/2014   Procedure: CATARACT EXTRACTION PHACO AND INTRAOCULAR LENS PLACEMENT RIGHT EYE CDE=6.57;  Surgeon: Tonny Branch, MD;  Location: [facility];  Service: Ophthalmology;  Laterality: Right;  . COLONOSCOPY  01/19/2011   Procedure: COLONOSCOPY;  Surgeon: Rogene Houston, MD;  [facility];  Service: Endoscopy;  Laterality: N/A;  2:00  . DILATION AND CURETTAGE OF UTERUS    . ESOPHAGOGASTRODUODENOSCOPY  11/05/2010    Procedure: ESOPHAGOGASTRODUODENOSCOPY (EGD);  Surgeon: Rogene Houston, MD;  [facility];  Service: Endoscopy;  Laterality: N/A;  7:00 am per [facility]  . ESOPHAGOGASTRODUODENOSCOPY N/A 07/25/2012   Procedure: ESOPHAGOGASTRODUODENOSCOPY (EGD);  Surgeon: Rogene Houston, MD;  [facility];  Service: Endoscopy;  Laterality: N/A;  400-moved to 42 Ann notified pt  . ESOPHAGOGASTRODUODENOSCOPY N/A 05/27/2015   Procedure: ESOPHAGOGASTRODUODENOSCOPY (EGD);  Surgeon: Rogene Houston, MD;  [facility];  Service: Endoscopy;  Laterality: N/A;  2:00  . HIATAL HERNIA REPAIR N/A 08/25/2015   Procedure: LAPAROSCOPIC REPAIR OF LARGE TYPE III  HIATAL HERNIA;  Surgeon: Johnathan Hausen, MD;  Location: [facility];  Service: General;  Laterality: N/A;  . NECK SURGERY     cervical disc  . 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 acetaminophen (TYLENOL) 325 MG tablet Take 325 mg by mouth every 6 (six) hours as needed for mild pain (For pain.).     Marland Kitchen amLODipine (NORVASC) 2.5 MG tablet TAKE 1 TABLET BY MOUTH  DAILY 90 tablet 0  . CALCIUM PO Take 1 tablet by mouth daily.    . Cholecalciferol (VITAMIN D PO) Take 1 tablet by mouth daily.    . Cyanocobalamin (B-12  PO) Take 1 tablet by mouth daily.     Marland Kitchen levothyroxine (SYNTHROID, LEVOTHROID) 50 MCG tablet Take 1 tablet (50 mcg total) by mouth daily. 90 tablet 3  . losartan (COZAAR) 100 MG tablet TAKE TABLET BY MOUTH ONCE  DAILY. 90 tablet 0  . Melatonin 5 MG TABS Take 5 mg by mouth at bedtime.    . Multiple Vitamin (MULTIVITAMIN) LIQD Take 5 mLs by mouth daily after lunch.    . ondansetron (ZOFRAN-ODT) 4 MG disintegrating tablet Take 1 tablet (4 mg total) by mouth every 6 (six) hours as needed for nausea. (Patient not taking: Reported on 03/31/2016) 20 tablet 0  . pantoprazole (PROTONIX) 40 MG tablet TAKE 1 TABLET BY  MOUTH  TWICE A DAY BEFORE MEALS 180 tablet 1  . sertraline (ZOLOFT) 100 MG tablet TAKE 1 TABLET BY MOUTH AT  BEDTIME 90 tablet 0  . SUMAtriptan (IMITREX) 100 MG tablet Take 1 tablet (100 mg total) by mouth every 2 (two) hours as needed for migraine. May repeat in 2 hours if headache persists or recurs. 10 tablet 2   No current facility-administered medications for this visit.    Codeine; Latex; Morphine and related; Penicillins; and Sulfa antibiotics Family History  Problem Relation Age of Onset  . Colon cancer Mother   . Heart disease Mother   . Arthritis Mother   . Heart disease Father   . Arthritis Father   . Arthritis Sister   . Arthritis Brother   . Arthritis Maternal Aunt   . Arthritis Maternal Uncle   . Diabetes Maternal Uncle    Social History:   reports that she has never smoked. She has never used smokeless tobacco. She reports that she does not drink alcohol or use drugs.   REVIEW OF SYSTEMS : Negative except for see problem list  Physical Exam:   There were no vitals taken for this visit. There is no height or weight on file to calculate BMI.  Gen:  WDWN WF NAD  Neurological: Alert and oriented to person, place, and time. Motor and sensory function is grossly intact  Head: Normocephalic and atraumatic.  Eyes: Conjunctivae are normal. Pupils are equal, round, and reactive to light. No scleral icterus.  Neck: Normal range of motion. Neck supple. No tracheal deviation or thyromegaly present.  Cardiovascular:  SR without murmurs or gallops.  No carotid bruits Breast:  Not examined  Respiratory: Effort normal.  No respiratory distress. No chest wall tenderness. Breath sounds normal.  No wheezes, rales or rhonchi.  Abdomen:  nontender GU:  Not examined Musculoskeletal: Normal range of motion. Extremities are nontender. No cyanosis, edema or clubbing noted Lymphadenopathy: No cervical, preauricular, postauricular or axillary adenopathy is present Skin: Skin is warm and  dry. No rash noted. No diaphoresis. No erythema. No pallor. Pscyh: Normal mood and affect. Behavior is normal. Judgment and thought content normal.   LABORATORY RESULTS: No results found for this or any previous visit (from the past 48 hour(s)).   RADIOLOGY RESULTS: No results found.  Problem List: Patient Active Problem List   Diagnosis Date Noted  . Status post laparoscopic Nissen fundoplication 21/30/8657  . Hiatal hernia 07/20/2015  . Pulmonary nodules 05/12/2015  . Insomnia 04/20/2015  . Melena 07/19/2012  . Hypertension 05/15/2012  . Hypothyroidism 05/15/2012  . History of colonic polyps 05/15/2012  . Anemia 05/15/2012  . Upper GI bleed 05/15/2012  . Heme positive stool 05/15/2012    Assessment & Plan: Recurrent GER with Nissen breakdown.  Plan  redo Nissen fundoplication.      Matt B. Hassell Done, MD, [facility] [phone] beeper [phone]  06/15/2016 10:37 AM

## 2016-06-17 NOTE — Anesthesia Preprocedure Evaluation (Addendum)
Anesthesia Evaluation  Patient identified by MRN, date of birth, ID band Patient awake    Reviewed: Allergy & Precautions, NPO status , Patient's Chart, lab work & pertinent test results  Airway Mallampati: III  TM Distance: <3 FB Neck ROM: Full    Dental  (+) Teeth Intact, Dental Advisory Given   Pulmonary neg pulmonary ROS,    Pulmonary exam normal breath sounds clear to auscultation       Cardiovascular hypertension, Pt. on medications Normal cardiovascular exam Rhythm:Regular Rate:Normal     Neuro/Psych  Headaches, PSYCHIATRIC DISORDERS Anxiety    GI/Hepatic Neg liver ROS, hiatal hernia, PUD, GERD  Medicated,  Endo/Other  Hypothyroidism   Renal/GU negative Renal ROS     Musculoskeletal  (+) Arthritis ,   Abdominal   Peds  Hematology negative hematology ROS (+)   Anesthesia Other Findings Day of surgery medications reviewed with the patient.  Reproductive/Obstetrics                            Anesthesia Physical Anesthesia Plan  ASA: II  Anesthesia Plan: General   Post-op Pain Management:    Induction: Intravenous  Airway Management Planned: Oral ETT and Video Laryngoscope Planned  Additional Equipment:   Intra-op Plan:   Post-operative Plan: Extubation in OR  Informed Consent: I have reviewed the patients History and Physical, chart, labs and discussed the procedure including the risks, benefits and alternatives for the proposed anesthesia with the patient or authorized representative who has indicated his/her understanding and acceptance.   Dental advisory given  Plan Discussed with: CRNA  Anesthesia Plan Comments: (Risks/benefits of general anesthesia discussed with patient including risk of damage to teeth, lips, gum, and tongue, nausea/vomiting, allergic reactions to medications, and the possibility of heart attack, stroke and death.  All patient questions answered.   Patient wishes to proceed.  2nd IV after induction )       Anesthesia Quick Evaluation

## 2016-06-17 NOTE — Transfer of Care (Signed)
Immediate Anesthesia Transfer of Care Note  Patient: YECENIA DALGLEISH  Procedure(s) Performed: Procedure(s): ENDOSCOPY, HIATAL HERNIA REPAIR, AND TAKE DOWN OF NISSEN FUNDOPILICATION (N/A)  Patient Location: PACU  Anesthesia Type:General  Level of Consciousness: sedated  Airway & Oxygen Therapy: Patient Spontanous Breathing and Patient connected to face mask oxygen  Post-op Assessment: Report given to RN and Post -op Vital signs reviewed and stable  Post vital signs: Reviewed and stable  Last Vitals:  Vitals:   06/17/16 0535  BP: (!) 146/66  Pulse: 77  Resp: 16  Temp: 36.8 C    Last Pain:  Vitals:   06/17/16 0535  TempSrc: Oral      Patients Stated Pain Goal: 4 (51/76/16 0737)  Complications: No apparent anesthesia complications

## 2016-06-17 NOTE — Op Note (Signed)
NAME:  Debra Sanchez, Debra Sanchez                ACCOUNT NO.:  MEDICAL RECORD NO.:  [id number]  LOCATION:                                 FACILITY:  PHYSICIAN:  Isabel Caprice. Hassell Done, MD  DATE OF BIRTH:  11/12/1940  DATE OF PROCEDURE:  06/17/2016 DATE OF DISCHARGE:                              OPERATIVE REPORT   PREOPERATIVE DIAGNOSIS:  Recurrent gastroesophageal reflux disease and dysphagia after Nissen fundoplication in May 0254.  PROCEDURE:  Esophageal foreshortening, takedown Nissen fundoplication and closure of the hiatus, upper endoscopy by Dr. Kae Heller and myself, and gastropexy.  SURGEON:  Isabel Caprice. Hassell Done, MD.  ASSISTANT:  Romana Juniper.  ANESTHESIA:  General endotracheal.  ESTIMATED BLOOD LOSS:  15 mL.  DRAINS:  None.  DESCRIPTION OF PROCEDURE:  The patient is a 76 year old white female, who has had 70% of her stomach noted in her chest last May when we did her hiatal hernia repair.  Postoperatively, she complained of a knot in her upper chest when she would swallow and about 6 weeks after her surgery, she got sick from her grand-kid's virus and vomited a lot and after that began having more heartburn and progressive GERD and dysphagia.  She was seen in the office, and we discussed repair and redo Nissen and redo hiatal hernia repair.  She also had a second opinion at [facility] regarding redo surgery.  The patient was taken to room 4 at [facility] and given general anesthesia.  The abdomen was prepped and draped and a time-out performed.  Access to the abdomen was achieved through the left upper quadrant using a 5 mm Optiview.  I tried to use some of the same trocar sites.  There was a little colonic appendiceal epiploica that was stuck up to a previous trocar site on the left side.  This was taken down without injury.  The Nathanson retractor was placed beneath the liver. The anatomy when we went in appeared pretty straightforward.  I took down stitches to the wrap  and it appeared she had a hiatal hernia above that.  We did sharp dissection and took down the wrap that was stuck on the right side to the crus and then mobilized what would appear to be herniated stomach up in the chest and this may have been part of the wrap and we reached a point posteriorly, where the structures of the wrap were appeared to be contiguous with material going up into the chest as if there may be more stomach there or there may be in a broad area of stomach.  I took down the wrap by dividing the sutures that I had placed before and undid it to about a 270-degree wrap.  We then endoscoped the patient and Dr. Kae Heller went above and passed the endoscope and we found some very foul-smelling brown blackish material in the distal esophagus, which we aspirated and found to our surprise that what we thought was the GE junction was in fact much higher and that despite our dissection into an area of dense fibrotic tissue that the esophagogastric junction lay above where we thought that it was.  In retrospect, my question was whether we in  the first operation had foreshortening of the esophagus so that the wrap after we had gotten all of the stomach down out of the chest had easily slipped above the hiatus and that was the source of her early on globus sensation in the proximal esophagus.  I did more dissection and then at that point, I went up and passed the scope into the stomach.  Dr. Kae Heller had left it in the mid esophagus and again measuring with blotting the stomach from the outside, the EG junction appeared to be well above where we thought it was.  At that point, I felt that it would be best to take down the wrap and to free any obstruction there and then to try to close the hiatus, but that I was not going to be able to get any more lengthening of the esophagus due to the dense adhesions.  I did not want to risk injury to the distal esophagus as the planes were  obscure.  I elected to close the hiatus posteriorly with 2 sutures using Surgidac and I tied intracorporeal knots.  I then performed a gastropexy with 2 sutures using the Endo Stitch along the greater curvature in the distal fundus tacking it to the left upper quadrant.  Everything looked to be in order.  There did not appear to be any enterotomies and we had insufflated under water and no bubbles were seen.  The port sites were all injected with Exparel.  The abdomen was deflated and the wounds were closed with 4-0 Monocryl and Dermabond.  The patient seemed to tolerate the procedure well and was taken to the recovery room in satisfactory condition.     Isabel Caprice Hassell Done, MD     MBM/MEDQ  D:  06/17/2016  T:  06/17/2016  Job:  [id number]

## 2016-06-17 NOTE — Interval H&P Note (Signed)
History and Physical Interval Note:  06/17/2016 7:22 AM  Debra Sanchez  has presented today for surgery, with the diagnosis of HERNIATION ABOVE NISSEN WRAP  The various methods of treatment have been discussed with the patient and family. After consideration of risks, benefits and other options for treatment, the patient has consented to  Procedure(s): REDO LAPAROSCOPIC NISSEN FUNDOPLICATION (N/A) as a surgical intervention .  The patient's history has been reviewed, patient examined, no change in status, stable for surgery.  I have reviewed the patient's chart and labs.  Questions were answered to the patient's satisfaction.     Cionna Collantes B

## 2016-06-17 NOTE — Anesthesia Postprocedure Evaluation (Signed)
Anesthesia Post Note  Patient: Debra Sanchez  Procedure(s) Performed: Procedure(s) (LRB): ENDOSCOPY, HIATAL HERNIA REPAIR, AND TAKE DOWN OF NISSEN FUNDOPILICATION (N/A)  Patient location during evaluation: Endoscopy Anesthesia Type: General Level of consciousness: awake and alert Pain management: pain level controlled Vital Signs Assessment: post-procedure vital signs reviewed and stable Respiratory status: spontaneous breathing, nonlabored ventilation, respiratory function stable and patient connected to nasal cannula oxygen Cardiovascular status: stable and blood pressure returned to baseline Anesthetic complications: no       Last Vitals:  Vitals:   06/17/16 1400 06/17/16 1500  BP: (!) 154/80 127/75  Pulse: 82 83  Resp: 17 18  Temp: 36.4 C 36.5 C    Last Pain:  Vitals:   06/17/16 1500  TempSrc: Oral  PainSc:                  [name]

## 2016-06-17 NOTE — Brief Op Note (Signed)
06/17/2016  10:51 AM  PATIENT:  [place]  76 y.o. female  PRE-OPERATIVE DIAGNOSIS:  HERNIATION ABOVE NISSEN WRAP  POST-OPERATIVE DIAGNOSIS:  HERNIATION ABOVE NISSEN WRAP  PROCEDURE:  Procedure(s): ENDOSCOPY, HIATAL HERNIA REPAIR, AND TAKE DOWN OF NISSEN FUNDOPILICATION (N/A)  SURGEON:  Surgeon(s) and Role:    * Clovis Riley, MD - Assisting    * Johnathan Hausen, MD - Primary  PHYSICIAN ASSISTANT:   ASSISTANTS: Romana Juniper, MD   ANESTHESIA:   general  EBL:  Total I/O In: 2200 [I.V.:2200] Out: 350 [Urine:300; Blood:50]  BLOOD ADMINISTERED:none  DRAINS: none   LOCAL MEDICATIONS USED:  BUPIVICAINE   SPECIMEN:  No Specimen  DISPOSITION OF SPECIMEN:  N/A  COUNTS:  YES  TOURNIQUET:  * No tourniquets in log *  DICTATION: .Other Dictation: Dictation Number [id number]  PLAN OF CARE: Admit to inpatient   PATIENT DISPOSITION:  PACU - hemodynamically stable.   Delay start of Pharmacological VTE agent (>24hrs) due to surgical blood loss or risk of bleeding: no

## 2016-06-18 LAB — BASIC METABOLIC PANEL
Anion gap: 4 — ABNORMAL LOW (ref 5–15)
BUN: 9 mg/dL (ref 6–20)
CO2: 27 mmol/L (ref 22–32)
Calcium: 8.9 mg/dL (ref 8.9–10.3)
Chloride: 108 mmol/L (ref 101–111)
Creatinine, Ser: 0.74 mg/dL (ref 0.44–1.00)
GFR calc Af Amer: >60 mL/min (ref >60)
GFR calc non Af Amer: >60 mL/min (ref >60)
Glucose, Bld: 109 mg/dL — ABNORMAL HIGH (ref 65–99)
Potassium: 4.1 mmol/L (ref 3.5–5.1)
Sodium: 139 mmol/L (ref 135–145)

## 2016-06-18 LAB — CBC
HCT: 34.7 % — ABNORMAL LOW (ref 36.0–46.0)
Hemoglobin: 11.5 g/dL — ABNORMAL LOW (ref 12.0–15.0)
MCH: 30.1 pg (ref 26.0–34.0)
MCHC: 33.1 g/dL (ref 30.0–36.0)
MCV: 90.8 fL (ref 78.0–100.0)
Platelets: 192 10*3/uL (ref 150–400)
RBC: 3.82 MIL/uL — ABNORMAL LOW (ref 3.87–5.11)
RDW: 13.4 % (ref 11.5–15.5)
WBC: 8.3 10*3/uL (ref 4.0–10.5)

## 2016-06-18 MED ORDER — LIP MEDEX EX OINT
TOPICAL_OINTMENT | CUTANEOUS | Status: DC | PRN
  Filled 2016-06-18: qty 7

## 2016-06-18 MED ORDER — MENTHOL 3 MG MT LOZG
1.0000 | LOZENGE | OROMUCOSAL | Status: DC | PRN
  Administered 2016-06-18: 3 mg via ORAL
  Filled 2016-06-18: qty 9

## 2016-06-18 MED ORDER — HYDROMORPHONE HCL 1 MG/ML IJ SOLN
1.0000 mg | INTRAMUSCULAR | Status: DC | PRN
  Administered 2016-06-18: 1 mg via INTRAVENOUS
  Filled 2016-06-18: qty 1

## 2016-06-18 NOTE — Progress Notes (Signed)
Pt called nurse feeling she had a reaction to Dilaudid. Flushed with c/o dizziness, nausea and itching. "i feel like I'm having hot flashes" stated pt. An antiemetic and Benadryl given. Denied chest pain or shortness of breath. No distress noted. Reassurance given. Dr. Marlou Starks on unit seeing pt. Awaiting opportunity to make him aware.

## 2016-06-18 NOTE — Progress Notes (Signed)
Dr. Marlou Starks aware via phone of recent reaction to Dilaudid. Nausea and itching and dizziness subsided since prn medications given earlier. No longer flushed. No new orders received at this time.

## 2016-06-18 NOTE — Progress Notes (Signed)
Pt requesting something for pain that lasts longer than IV fentanyl. Dr. Marlou Starks made aware. Morphine IV ordered by Dr. Marlou Starks. Spoke with pt about allergies to morphine. Pt reports she gets severe headaches with morphine and cannot take drug. Dr. Marlou Starks made aware. Order given for dilaudid. See med list. VWilliams, rn.

## 2016-06-18 NOTE — Progress Notes (Signed)
Patient ID: Debra Sanchez, female   DOB: Aug 11, 1940, 76 y.o.   MRN: [id number] [facility] Surgery Progress Note:   1 Day Post-Op  Subjective: Mental status is clear.  She is feeling better Objective: Vital signs in last 24 hours: Temp:  [97.5 F (36.4 C)-98.4 F (36.9 C)] 98.4 F (36.9 C) (03/10 0200) Pulse Rate:  [78-89] 78 (03/10 0200) Resp:  [14-23] 18 (03/10 0200) BP: (127-177)/(74-90) 177/77 (03/10 0200) SpO2:  [95 %-100 %] 100 % (03/10 0200)  Intake/Output from previous day: 03/09 0701 - 03/10 0700 In: 4026.7 [I.V.:3776.7] Out: 3500 [Urine:3450; Blood:50] Intake/Output this shift: No intake/output data recorded.  Physical Exam: Work of breathing is normal.  No nause  Lab Results:  Results for orders placed or performed during the hospital encounter of 06/17/16 (from the past 48 hour(s))  CBC     Status: Abnormal   Collection Time: 06/17/16 12:06 PM  Result Value Ref Range   WBC 8.5 4.0 - 10.5 K/uL   RBC 3.88 3.87 - 5.11 MIL/uL   Hemoglobin 11.7 (L) 12.0 - 15.0 g/dL   HCT 35.2 (L) 36.0 - 46.0 %   MCV 90.7 78.0 - 100.0 fL   MCH 30.2 26.0 - 34.0 pg   MCHC 33.2 30.0 - 36.0 g/dL   RDW 13.5 11.5 - 15.5 %   Platelets 179 150 - 400 K/uL  Creatinine, serum     Status: Abnormal   Collection Time: 06/17/16 12:06 PM  Result Value Ref Range   Creatinine, Ser 0.97 0.44 - 1.00 mg/dL   GFR calc non Af Amer 55 (L) >60 mL/min   GFR calc Af Amer >60 >60 mL/min    Comment: (NOTE) The eGFR has been calculated using the CKD EPI equation. This calculation has not been validated in all clinical situations. eGFR's persistently <60 mL/min signify possible Chronic Kidney Disease.   CBC     Status: Abnormal   Collection Time: 06/18/16  5:11 AM  Result Value Ref Range   WBC 8.3 4.0 - 10.5 K/uL   RBC 3.82 (L) 3.87 - 5.11 MIL/uL   Hemoglobin 11.5 (L) 12.0 - 15.0 g/dL   HCT 34.7 (L) 36.0 - 46.0 %   MCV 90.8 78.0 - 100.0 fL   MCH 30.1 26.0 - 34.0 pg   MCHC 33.1 30.0 - 36.0  g/dL   RDW 13.4 11.5 - 15.5 %   Platelets 192 150 - 400 K/uL  Basic metabolic panel     Status: Abnormal   Collection Time: 06/18/16  5:11 AM  Result Value Ref Range   Sodium 139 135 - 145 mmol/L   Potassium 4.1 3.5 - 5.1 mmol/L   Chloride 108 101 - 111 mmol/L   CO2 27 22 - 32 mmol/L   Glucose, Bld 109 (H) 65 - 99 mg/dL   BUN 9 6 - 20 mg/dL   Creatinine, Ser 0.74 0.44 - 1.00 mg/dL   Calcium 8.9 8.9 - 10.3 mg/dL   GFR calc non Af Amer >60 >60 mL/min   GFR calc Af Amer >60 >60 mL/min    Comment: (NOTE) The eGFR has been calculated using the CKD EPI equation. This calculation has not been validated in all clinical situations. eGFR's persistently <60 mL/min signify possible Chronic Kidney Disease.    Anion gap 4 (L) 5 - 15    Radiology/Results: No results found.  Anti-infectives: Anti-infectives    Start     Dose/Rate Route Frequency Ordered Stop   06/17/16 0552  ceFAZolin (  ANCEF) IVPB 2g/100 mL premix     2 g 200 mL/hr over 30 Minutes Intravenous On call to O.R. 06/17/16 0552 06/17/16 0740      Assessment/Plan: Problem List: Patient Active Problem List   Diagnosis Date Noted  . Hiatal hernia with GERD 06/17/2016  . Status post laparoscopic Nissen fundoplication 24/23/5361  . Hiatal hernia 07/20/2015  . Pulmonary nodules 05/12/2015  . Insomnia 04/20/2015  . Melena 07/19/2012  . Hypertension 05/15/2012  . Hypothyroidism 05/15/2012  . History of colonic polyps 05/15/2012  . Anemia 05/15/2012  . Upper GI bleed 05/15/2012  . Heme positive stool 05/15/2012    I explained the findings.  The knot in her throat is gone.  Will begin clear liquids 1 Day Post-Op    LOS: 1 day   Matt B. Hassell Done, MD, [facility] [phone] beeper [phone]  06/18/2016 8:44 AM

## 2016-06-19 MED ORDER — PANTOPRAZOLE SODIUM 40 MG PO TBEC
40.0000 mg | DELAYED_RELEASE_TABLET | Freq: Every day | ORAL | Status: DC
  Administered 2016-06-19: 40 mg via ORAL
  Filled 2016-06-19: qty 1

## 2016-06-19 MED ORDER — ONDANSETRON 4 MG PO TBDP: 4.0000 mg | ORAL_TABLET | Freq: Four times a day (QID) | ORAL | 0 refills | Status: DC | PRN

## 2016-06-19 MED ORDER — SERTRALINE HCL 100 MG PO TABS: 100.0000 mg | ORAL_TABLET | Freq: Every day | ORAL | Status: DC

## 2016-06-19 MED ORDER — LEVOTHYROXINE SODIUM 50 MCG PO TABS
50.0000 ug | ORAL_TABLET | Freq: Every day | ORAL | Status: DC
  Administered 2016-06-19: 50 ug via ORAL
  Filled 2016-06-19: qty 1

## 2016-06-19 MED ORDER — SUMATRIPTAN SUCCINATE 100 MG PO TABS
100.0000 mg | ORAL_TABLET | ORAL | Status: DC | PRN
  Filled 2016-06-19: qty 1

## 2016-06-19 MED ORDER — LOSARTAN POTASSIUM 50 MG PO TABS
100.0000 mg | ORAL_TABLET | Freq: Every day | ORAL | Status: DC
  Administered 2016-06-19: 100 mg via ORAL
  Filled 2016-06-19: qty 2

## 2016-06-19 MED ORDER — ADULT MULTIVITAMIN LIQUID CH
5.0000 mL | Freq: Every day | ORAL | Status: DC
  Filled 2016-06-19: qty 15

## 2016-06-19 MED ORDER — ACETAMINOPHEN 325 MG PO TABS
650.0000 mg | ORAL_TABLET | ORAL | Status: DC | PRN
  Administered 2016-06-19: 650 mg via ORAL
  Filled 2016-06-19: qty 2

## 2016-06-19 NOTE — Progress Notes (Addendum)
Pt requesting something for a headache. Refusing imitrex at this time. Reports med is too strong. Dr.Martin on floor and made aware. Order given for tylenol. VWilliams,rn.

## 2016-06-19 NOTE — Discharge Summary (Signed)
Physician Discharge Summary  Patient ID: Debra Sanchez MRN: [id number] DOB/AGE: 06/17/1940 76 y.o.  Admit date: 06/17/2016 Discharge date: 06/19/2016  Admission Diagnoses:  Dysphagia and pyrosis  Discharge Diagnoses:  same  Active Problems:   Hiatal hernia with GERD   Surgery:  Laparoscopic takedown of Nissen, Hiatal hernia repair and gastropexy  Discharged Condition: improved  Hospital Course:   Had surgery on Friday.  Begun on clears which she tolerated.  Ready for discharge on Sunday  Consults: none  Significant Diagnostic Studies: none    Discharge Exam: Blood pressure (!) 176/89, pulse 71, temperature 98.4 F (36.9 C), temperature source Oral, resp. rate 18, height 5\' 8"  (1.727 m), weight 87.5 kg (193 lb), SpO2 96 %. Incisions OK  Disposition: 01-Home or Self Care  Discharge Instructions    Call MD for:  persistant nausea and vomiting    Complete by:  As directed    Call MD for:  severe uncontrolled pain    Complete by:  As directed    Diet - low sodium heart healthy    Complete by:  As directed    Discharge instructions    Complete by:  As directed    May shower.   Stay of full liquids for a couple of days then progress to pureed diet for 2 weeks before resuming regular diet.   Discharge wound care:    Complete by:  As directed    May shower   Increase activity slowly    Complete by:  As directed      Allergies as of 06/19/2016      Reactions   Hydromorphone Itching, Nausea Only, Other (See Comments)   After IV dilaudid pt flushed and felt dizzy   Codeine Itching   Latex Other (See Comments)   fainted   Morphine And Related Itching   Penicillins Itching, Nausea And Vomiting   Has patient had a PCN reaction causing immediate rash, facial/tongue/throat swelling, SOB or lightheadedness with hypotension: No Has patient had a PCN reaction causing severe rash involving mucus membranes or skin necrosis: No Has patient had a PCN reaction that required  hospitalization No Has patient had a PCN reaction occurring within the last 10 years: Yes If all of the above answers are "NO", then may proceed with Cephalosporin use.   Sulfa Antibiotics Nausea And Vomiting      Medication List    TAKE these medications   acetaminophen 325 MG tablet Commonly known as:  TYLENOL Take 325 mg by mouth every 6 (six) hours as needed for mild pain (For pain.).   amLODipine 2.5 MG tablet Commonly known as:  NORVASC TAKE 1 TABLET BY MOUTH  DAILY   B-12 PO Take 1 tablet by mouth daily.   CALCIUM PO Take 1 tablet by mouth daily.   levothyroxine 50 MCG tablet Commonly known as:  SYNTHROID, LEVOTHROID Take 1 tablet (50 mcg total) by mouth daily.   losartan 100 MG tablet Commonly known as:  COZAAR TAKE TABLET BY MOUTH ONCE  DAILY.   Melatonin 5 MG Tabs Take 5 mg by mouth at bedtime.   multivitamin Liqd Take 5 mLs by mouth daily after lunch.   ondansetron 4 MG disintegrating tablet Commonly known as:  ZOFRAN-ODT Take 1 tablet (4 mg total) by mouth every 6 (six) hours as needed for nausea. What changed:  Another medication with the same name was added. Make sure you understand how and when to take each.   ondansetron 4 MG disintegrating tablet  Commonly known as:  ZOFRAN-ODT Take 1 tablet (4 mg total) by mouth every 6 (six) hours as needed for nausea. What changed:  You were already taking a medication with the same name, and this prescription was added. Make sure you understand how and when to take each.   pantoprazole 40 MG tablet Commonly known as:  PROTONIX TAKE 1 TABLET BY MOUTH  TWICE A DAY BEFORE MEALS   sertraline 100 MG tablet Commonly known as:  ZOLOFT TAKE 1 TABLET BY MOUTH AT  BEDTIME   SUMAtriptan 100 MG tablet Commonly known as:  IMITREX Take 1 tablet (100 mg total) by mouth every 2 (two) hours as needed for migraine. May repeat in 2 hours if headache persists or recurs.   VITAMIN D PO Take 1 tablet by mouth daily.       Follow-up Information    Saad Buhl B, MD. Schedule an appointment as soon as possible for a visit in 4 week(s).   Specialty:  General Surgery Contact information: [place] [address] [phone]           Signed: Pedro Earls 06/19/2016, 11:54 AM

## 2016-06-19 NOTE — Progress Notes (Addendum)
Pt discharged to home. Dc instructions given with son and female family member at bedside. No concerns voiced. No prescriptions given. Pt to take tylenol for pain at home. Pt reminded to stop by pharmacy and pick up nausea med that was e-prescribed by MD. Left unit in wheelchair pushed by nurse tech. Left in good condition. Voided x 2 prior to discharge.  VWilliams,rn.

## 2016-07-18 ENCOUNTER — Ambulatory Visit (INDEPENDENT_AMBULATORY_CARE_PROVIDER_SITE_OTHER): Payer: Medicare Other | Admitting: Family Medicine

## 2016-07-18 VITALS — BP 122/79 | HR 65 | Temp 97.9°F | Resp 20 | Ht 68.0 in | Wt 197.0 lb

## 2016-07-18 DIAGNOSIS — E039 Hypothyroidism, unspecified: Secondary | ICD-10-CM | POA: Diagnosis not present

## 2016-07-18 DIAGNOSIS — G4700 Insomnia, unspecified: Secondary | ICD-10-CM

## 2016-07-18 DIAGNOSIS — K449 Diaphragmatic hernia without obstruction or gangrene: Secondary | ICD-10-CM | POA: Diagnosis not present

## 2016-07-18 DIAGNOSIS — K219 Gastro-esophageal reflux disease without esophagitis: Secondary | ICD-10-CM | POA: Diagnosis not present

## 2016-07-18 DIAGNOSIS — I10 Essential (primary) hypertension: Secondary | ICD-10-CM

## 2016-07-18 MED ORDER — PANTOPRAZOLE SODIUM 40 MG PO TBEC: DELAYED_RELEASE_TABLET | ORAL | 1 refills | Status: DC

## 2016-07-18 MED ORDER — AMLODIPINE BESYLATE 2.5 MG PO TABS: 2.5000 mg | ORAL_TABLET | Freq: Every day | ORAL | 1 refills | Status: DC

## 2016-07-18 MED ORDER — LOSARTAN POTASSIUM 100 MG PO TABS: ORAL_TABLET | ORAL | 1 refills | Status: DC

## 2016-07-18 MED ORDER — SERTRALINE HCL 100 MG PO TABS: 100.0000 mg | ORAL_TABLET | Freq: Every day | ORAL | 1 refills | Status: DC

## 2016-07-18 NOTE — Progress Notes (Signed)
Debra Sanchez , [birth year], 76 y.o., female MRN: [id number] Patient Care Team    Relationship Specialty Notifications Start End  Ma Hillock, DO PCP - General Family Medicine  04/20/15     CC: chronic disease  Subjective:  Hypothyroidism, unspecified type Reports compliance with levothyroxine 50 mcg daily. TSH 1.39 01/2016.   Essential hypertension Patient reports compliance with amlodipine and losartan today. Her BP is normal today. She does report occasional systolic pressures into the 150, but that is with stress. She denies chest pain, shortness of breath, dizziness. She reports occasional mild edema.   Insomnia, unspecified type: Patient states she is doing well on zoloft. She does have increased stress right now with her granddaughter, but [place] managing. Granddaughter is using drugs, and they recently had to  take her kids off her. She reports compliance with zoloft. She denies side effects to medications. She does need refills today.    Allergies  Allergen Reactions  . Hydromorphone Itching, Nausea Only and Other (See Comments)    After IV dilaudid pt flushed and felt dizzy  . Codeine Itching  . Latex Other (See Comments)    fainted  . Morphine And Related Itching  . Penicillins Itching and Nausea And Vomiting    Has patient had a PCN reaction causing immediate rash, facial/tongue/throat swelling, SOB or lightheadedness with hypotension: No Has patient had a PCN reaction causing severe rash involving mucus membranes or skin necrosis: No Has patient had a PCN reaction that required hospitalization No Has patient had a PCN reaction occurring within the last 10 years: Yes If all of the above answers are "NO", then may proceed with Cephalosporin use.   . Sulfa Antibiotics Nausea And Vomiting   Social History  Substance Use Topics  . Smoking status: Never Smoker  . Smokeless tobacco: Never Used  . Alcohol use No   Past Medical History:  Diagnosis Date  .  Anemia   . Anxiety   . Arthritis   . Blood transfusion 2012  . Colon polyp   . DDD (degenerative disc disease), thoracolumbar   . Degenerative disc disease, lumbar   . Family history of adverse reaction to anesthesia    pts sister got very sick / burnt esophagus   . Gastric ulcer   . GERD (gastroesophageal reflux disease)   . Heart murmur   . History of hiatal hernia   . Hypertension   . Hypothyroidism   . Migraines   . Miscarriage   . Osteoporosis   . Pneumonia yrs ago  . Syncope 2012  . Wears glasses    Past Surgical History:  Procedure Laterality Date  . ABDOMINAL HYSTERECTOMY    . BILATERAL OOPHORECTOMY    . CATARACT EXTRACTION W/PHACO Left 10/20/2014   Procedure: CATARACT EXTRACTION PHACO AND INTRAOCULAR LENS PLACEMENT LEFT EYE;  Surgeon: Tonny Branch, MD;  Location: [facility];  Service: Ophthalmology;  Laterality: Left;  CDE:6.98  . CATARACT EXTRACTION W/PHACO Right 11/17/2014   Procedure: CATARACT EXTRACTION PHACO AND INTRAOCULAR LENS PLACEMENT RIGHT EYE CDE=6.57;  Surgeon: Tonny Branch, MD;  Location: [facility];  Service: Ophthalmology;  Laterality: Right;  . COLONOSCOPY  01/19/2011   Procedure: COLONOSCOPY;  Surgeon: Rogene Houston, MD;  Location: [facility];  Service: Endoscopy;  Laterality: N/A;  2:00  . DILATION AND CURETTAGE OF UTERUS    . ESOPHAGOGASTRODUODENOSCOPY  11/05/2010   Procedure: ESOPHAGOGASTRODUODENOSCOPY (EGD);  Surgeon: Rogene Houston, MD;  Location: [facility];  Service:  Endoscopy;  Laterality: N/A;  7:00 am per [facility]  . ESOPHAGOGASTRODUODENOSCOPY N/A 07/25/2012   Procedure: ESOPHAGOGASTRODUODENOSCOPY (EGD);  Surgeon: Rogene Houston, MD;  Location: [facility];  Service: Endoscopy;  Laterality: N/A;  400-moved to 83 Ann notified pt  . ESOPHAGOGASTRODUODENOSCOPY N/A 05/27/2015   Procedure: ESOPHAGOGASTRODUODENOSCOPY (EGD);  Surgeon: Rogene Houston, MD;  Location: [facility];  Service: Endoscopy;  Laterality: N/A;  2:00  . HIATAL HERNIA REPAIR N/A  08/25/2015   Procedure: LAPAROSCOPIC REPAIR OF LARGE TYPE III  HIATAL HERNIA;  Surgeon: Johnathan Hausen, MD;  Location: [facility];  Service: General;  Laterality: N/A;  . LAPAROSCOPIC NISSEN FUNDOPLICATION N/A 09/16/1243   Procedure: ENDOSCOPY, HIATAL HERNIA REPAIR, AND TAKE DOWN OF NISSEN FUNDOPILICATION;  Surgeon: Johnathan Hausen, MD;  Location: [facility];  Service: General;  Laterality: N/A;  . NECK SURGERY     cervical disc  . 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 Colon cancer Mother   . Heart disease Mother   . Arthritis Mother   . Heart disease Father   . Arthritis Father   . Arthritis Sister   . Arthritis Brother   . Arthritis Maternal Aunt   . Arthritis Maternal Uncle   . Diabetes Maternal Uncle    Allergies as of 07/18/2016      Reactions   Hydromorphone Itching, Nausea Only, Other (See Comments)   After IV dilaudid pt flushed and felt dizzy   Codeine Itching   Latex Other (See Comments)   fainted   Morphine And Related Itching   Penicillins Itching, Nausea And Vomiting   Has patient had a PCN reaction causing immediate rash, facial/tongue/throat swelling, SOB or lightheadedness with hypotension: No Has patient had a PCN reaction causing severe rash involving mucus membranes or skin necrosis: No Has patient had a PCN reaction that required hospitalization No Has patient had a PCN reaction occurring within the last 10 years: Yes If all of the above answers are "NO", then may proceed with Cephalosporin use.   Sulfa Antibiotics Nausea And Vomiting      Medication List       Accurate as of 07/18/16 11:25 AM. Always use your most recent med list.          acetaminophen 325 MG tablet Commonly known as:  TYLENOL Take 325 mg by mouth every 6 (six) hours as needed for mild pain (For pain.).   amLODipine 2.5 MG tablet Commonly known as:  NORVASC TAKE 1 TABLET BY MOUTH   DAILY   levothyroxine 50 MCG tablet Commonly known as:  SYNTHROID, LEVOTHROID Take 1 tablet (50 mcg total) by mouth daily.   losartan 100 MG tablet Commonly known as:  COZAAR TAKE TABLET BY MOUTH ONCE  DAILY.   Melatonin 5 MG Tabs Take 5 mg by mouth at bedtime.   multivitamin Liqd Take 5 mLs by mouth daily after lunch.   pantoprazole 40 MG tablet Commonly known as:  PROTONIX TAKE 1 TABLET BY MOUTH  TWICE A DAY BEFORE MEALS   sertraline 100 MG tablet Commonly known as:  ZOLOFT TAKE 1 TABLET BY MOUTH AT  BEDTIME   SUMAtriptan 100 MG tablet Commonly known as:  IMITREX Take 1 tablet (100 mg total) by mouth every 2 (two) hours as needed for migraine. May repeat in 2 hours if headache persists or recurs.  No results found for this or any previous visit (from the past 24 hour(s)). No results found.   ROS: Negative, with the exception of above mentioned in HPI   Objective:  BP 122/79 (BP Location: Left Arm, Patient Position: Sitting, Cuff Size: Large)   Pulse 65   Temp 97.9 F (36.6 C)   Resp 20   Ht 5\' 8"  (1.727 m)   Wt 197 lb (89.4 kg)   SpO2 97%   BMI 29.95 kg/m  Body mass index is 29.95 kg/m. Gen: Afebrile. No acute distress.  HENT: AT. [place].  MMM.  Eyes:Pupils Equal Round Reactive to light, Extraocular movements intact,  Conjunctiva without redness, discharge or icterus. CV: RRR, trace edema, +2/4 P posterior tibialis pulses Chest: CTAB, no wheeze or crackles Abd: Soft. NTND. BS present. No  Masses palpated.  Skin: no rashes, purpura or petechiae.  Neuro:  Normal gait. PERLA. EOMi. Alert. Oriented.  Psych: Normal affect, dress and demeanor. Normal speech. Normal thought content and judgment.    Assessment/Plan: Debra Sanchez is a 76 y.o. female present for OV for  Hypothyroidism, unspecified type - no refills needed at this time.  - follow yearly.   Essential hypertension - stable today.  - continue low sodium, and exercise.   - Continue  with refills on amlodipine 2.5 mg Qd and losartan 100 mg QD. - F/U 6 months if remains stable, sooner if medication adjustment needed.   Insomnia, unspecified type - stable. She is going through some added stress with her granddaughter.  - Continue zoloft 100 mg Qd, refills provided today.  - F/u 6 months  GERD with recent [employer] fundoplication:  - continue Protonix, refills prescribed today.   - F/U 6 months on chronic conditions .  electronically signed by:  Howard Pouch, DO  [facility] - [place]    '

## 2016-07-18 NOTE — Patient Instructions (Signed)
It a pleasure seeing you today.   I have refilled all the medications you needed today.   I will see you in 6 months, unless you need to see me sooner.    Please help [place] help you:  We are honored you have chosen [facility] for your Primary Care home. Below you will find basic instructions that you may need to access in the future. Please help [place] help you by reading the instructions, which cover many of the frequent questions we experience.   Prescription refills and request:  -In order to allow more efficient response time, please call your pharmacy for all refills. They will forward the request electronically to [place]. This allows for the quickest possible response. Request left on a nurse line can take longer to refill, since these are checked as time allows between office patients and other phone calls.  - refill request can take up to 3-5 working days to complete.  - If request is sent electronically and request is appropiate, it is usually completed in 1-2 business days.  - all patients will need to be seen routinely for all chronic medical conditions requiring prescription medications (see follow-up below). If you are overdue for follow up on your condition, you will be asked to make an appointment and we will call in enough medication to cover you until your appointment (up to 30 days).  - all controlled substances will require a face to face visit to request/refill.  - if you desire your prescriptions to go through a new pharmacy, and have an active script at original pharmacy, you will need to call your pharmacy and have scripts transferred to new pharmacy. This is completed between the pharmacy locations and not by your provider.    Results: If any images or labs were ordered, it can take up to 1 week to get results depending on the test ordered and the lab/facility running and resulting the test. - Normal or stable results, which do not need further discussion, will be released to  your mychart immediately with attached note to you. A call will not be generated for normal results. Please make certain to sign up for mychart. If you have questions on how to activate your mychart you can call the front office.  - If your results need further discussion, our office will attempt to contact you via phone, and if unable to reach you after 2 attempts, we will release your abnormal result to your mychart with instructions.  - All results will be automatically released in mychart after 1 week.  - Your provider will provide you with explanation and instruction on all relevant material in your results. Please keep in mind, results and labs may appear confusing or abnormal to the untrained eye, but it does not mean they are actually abnormal for you personally. If you have any questions about your results that are not covered, or you desire more detailed explanation than what was provided, you should make an appointment with your provider to do so.   Our office handles many outgoing and incoming calls daily. If we have not contacted you within 1 week about your results, please check your mychart to see if there is a message first and if not, then contact our office.  In helping with this matter, you help decrease call volume, and therefore allow [place] to be able to respond to patients needs more efficiently.   Acute office visits (sick visit):  An acute visit is intended  for a new problem and are scheduled in shorter time slots to allow schedule openings for patients with new problems. This is the appropriate visit to discuss a new problem. In order to provide you with excellent quality medical care with proper time for you to explain your problem, have an exam and receive treatment with instructions, these appointments should be limited to one new problem per visit. If you experience a new problem, in which you desire to be addressed, please make an acute office visit, we save openings on the  schedule to accommodate you. Please do not save your new problem for any other type of visit, let us take care of it properly and quickly for you.   Follow up visits:  Depending on your condition(s) your provider will need to see you routinely in order to provide you with quality care and prescribe medication(s). Most chronic conditions (Example: hypertension, Diabetes, depression/anxiety... etc), require visits a couple times a year. Your provider will instruct you on proper follow up for your personal medical conditions and history. Please make certain to make follow up appointments for your condition as instructed. Failing to do so could result in lapse in your medication treatment/refills. If you request a refill, and are overdue to be seen on a condition, we will always provide you with a 30 day script (once) to allow you time to schedule.    Medicare wellness (well visit): - we have a wonderful Nurse Maudie Mercury), that will meet with you and provide you will yearly medicare wellness visits. These visits should occur yearly (can not be scheduled less than 1 calendar year apart) and cover preventive health, immunizations, advance directives and screenings you are entitled to yearly through your medicare benefits. Do not miss out on your entitled benefits, this is when medicare will pay for these benefits to be ordered for you.  These are strongly encouraged by your provider and is the appropriate type of visit to make certain you are up to date with all preventive health benefits. If you have not had your medicare wellness exam in the last 12 months, please make certain to schedule one by calling the office and schedule your medicare wellness with Maudie Mercury as soon as possible.   Yearly physical (well visit):  - Adults are recommended to be seen yearly for physicals. Check with your insurance and date of your last physical, most insurances require one calendar year between physicals. Physicals include all  preventive health topics, screenings, medical exam and labs that are appropriate for gender/age and history. You may have fasting labs needed at this visit. This is a well visit (not a sick visit), acute topics should not be covered during this visit.  - Pediatric patients are seen more frequently when they are younger. Your provider will advise you on well child visit timing that is appropriate for your their age. - This is not a medicare wellness visit. Medicare wellness exams do not have an exam portion to the visit. Some medicare companies allow for a physical, some do not allow a yearly physical. If your medicare allows a yearly physical you can schedule the medicare wellness with our nurse Maudie Mercury and have your physical with your provider after, on the same day. Please check with insurance for your full benefits.   Late Policy/No Shows:  - all new patients should arrive 15-30 minutes earlier than appointment to allow [place] time  to  obtain all personal demographics,  insurance information and for you to complete office  paperwork. - All established patients should arrive 10-15 minutes earlier than appointment time to update all information and be checked in .  - In our best efforts to run on time, if you are late for your appointment you will be asked to either reschedule or if able, we will work you back into the schedule. There will be a wait time to work you back in the schedule,  depending on availability.  - If you are unable to make it to your appointment as scheduled, please call 24 hours ahead of time to allow [place] to fill the time slot with someone else who needs to be seen. If you do not cancel your appointment ahead of time, you may be charged a no show fee.

## 2016-08-17 ENCOUNTER — Other Ambulatory Visit (HOSPITAL_COMMUNITY): Payer: Self-pay | Admitting: Surgery

## 2016-08-17 DIAGNOSIS — Z8719 Personal history of other diseases of the digestive system: Secondary | ICD-10-CM

## 2016-08-18 ENCOUNTER — Ambulatory Visit (HOSPITAL_COMMUNITY)
Admission: RE | Admit: 2016-08-18 | Discharge: 2016-08-18 | Disposition: A | Discharge status: Home / Self Care | Payer: Medicare Other | Source: Ambulatory Visit | Attending: Surgery | Admitting: Surgery

## 2016-08-18 DIAGNOSIS — K219 Gastro-esophageal reflux disease without esophagitis: Secondary | ICD-10-CM | POA: Insufficient documentation

## 2016-08-18 DIAGNOSIS — Z8719 Personal history of other diseases of the digestive system: Secondary | ICD-10-CM | POA: Diagnosis present

## 2016-08-18 DIAGNOSIS — K449 Diaphragmatic hernia without obstruction or gangrene: Secondary | ICD-10-CM | POA: Diagnosis not present

## 2016-10-15 DIAGNOSIS — R05 Cough: Secondary | ICD-10-CM | POA: Diagnosis not present

## 2016-10-15 DIAGNOSIS — R0981 Nasal congestion: Secondary | ICD-10-CM | POA: Diagnosis not present

## 2016-10-15 DIAGNOSIS — J40 Bronchitis, not specified as acute or chronic: Secondary | ICD-10-CM | POA: Diagnosis not present

## 2016-10-26 ENCOUNTER — Encounter: Payer: Self-pay | Admitting: *Deleted

## 2016-10-26 ENCOUNTER — Telehealth: Payer: Self-pay | Admitting: *Deleted

## 2016-10-26 NOTE — Telephone Encounter (Signed)
Sent message in MyChart to call and schedule Medicare Wellness visit.

## 2016-11-07 ENCOUNTER — Other Ambulatory Visit: Payer: Self-pay | Admitting: Family Medicine

## 2016-11-15 ENCOUNTER — Encounter: Payer: Self-pay | Admitting: Family Medicine

## 2016-11-15 ENCOUNTER — Ambulatory Visit (INDEPENDENT_AMBULATORY_CARE_PROVIDER_SITE_OTHER): Payer: Medicare Other | Admitting: Family Medicine

## 2016-11-15 VITALS — BP 130/80 | HR 76 | Temp 98.3°F | Resp 20 | Wt 196.5 lb

## 2016-11-15 DIAGNOSIS — R51 Headache: Secondary | ICD-10-CM | POA: Diagnosis not present

## 2016-11-15 DIAGNOSIS — R519 Headache, unspecified: Secondary | ICD-10-CM

## 2016-11-15 DIAGNOSIS — I10 Essential (primary) hypertension: Secondary | ICD-10-CM | POA: Diagnosis not present

## 2016-11-15 NOTE — Progress Notes (Signed)
Debra Sanchez , 02/24/1941, 76 y.o., female MRN: [id number] Patient Care Team    Relationship Specialty Notifications Start End  Ma Hillock, DO PCP - General Family Medicine  04/20/15     Chief Complaint  Patient presents with  . Hypertension     Subjective: Pt presents for an OV with complaints of headache and increased BP of 4 weeks duration.  She has been taking her BP in the morning, right after taking meds with ranges between 147-166/58-82. Discussed with her typically we like to have patients wait at least 1-2 hours to take blood pressure after taking medications. Patient reports compliance with amlodipine 2.5 mg daily, Cozaar 100 mg daily. Patient denies chest pain, shortness of breath, dizziness or lower extremity edema.   She does have a history of migraines.  Depression screen [facility] 2/9 07/18/2016 04/20/2015  Decreased Interest 0 0  Down, Depressed, Hopeless 0 0  PHQ - 2 Score 0 0    Allergies  Allergen Reactions  . Hydromorphone Itching, Nausea Only and Other (See Comments)    After IV dilaudid pt flushed and felt dizzy  . Codeine Itching  . Latex Other (See Comments)    fainted  . Morphine And Related Itching  . Penicillins Itching and Nausea And Vomiting    Has patient had a PCN reaction causing immediate rash, facial/tongue/throat swelling, SOB or lightheadedness with hypotension: No Has patient had a PCN reaction causing severe rash involving mucus membranes or skin necrosis: No Has patient had a PCN reaction that required hospitalization No Has patient had a PCN reaction occurring within the last 10 years: Yes If all of the above answers are "NO", then may proceed with Cephalosporin use.   . Sulfa Antibiotics Nausea And Vomiting   Social History  Substance Use Topics  . Smoking status: Never Smoker  . Smokeless tobacco: Never Used  . Alcohol use No   Past Medical History:  Diagnosis Date  . Anemia   . Anxiety   . Arthritis   . Blood transfusion  2012  . Colon polyp   . DDD (degenerative disc disease), thoracolumbar   . Degenerative disc disease, lumbar   . Family history of adverse reaction to anesthesia    pts sister got very sick / burnt esophagus   . Gastric ulcer   . GERD (gastroesophageal reflux disease)   . Heart murmur   . History of hiatal hernia   . Hypertension   . Hypothyroidism   . Migraines   . Miscarriage   . Osteoporosis   . Pneumonia yrs ago  . Syncope 2012  . Wears glasses    Past Surgical History:  Procedure Laterality Date  . ABDOMINAL HYSTERECTOMY    . BILATERAL OOPHORECTOMY    . CATARACT EXTRACTION W/PHACO Left 10/20/2014   Procedure: CATARACT EXTRACTION PHACO AND INTRAOCULAR LENS PLACEMENT LEFT EYE;  Surgeon: Tonny Branch, MD;  Location: [facility];  Service: Ophthalmology;  Laterality: Left;  CDE:6.98  . CATARACT EXTRACTION W/PHACO Right 11/17/2014   Procedure: CATARACT EXTRACTION PHACO AND INTRAOCULAR LENS PLACEMENT RIGHT EYE CDE=6.57;  Surgeon: Tonny Branch, MD;  Location: [facility];  Service: Ophthalmology;  Laterality: Right;  . COLONOSCOPY  01/19/2011   Procedure: COLONOSCOPY;  Surgeon: Rogene Houston, MD;  Location: [facility];  Service: Endoscopy;  Laterality: N/A;  2:00  . DILATION AND CURETTAGE OF UTERUS    . ESOPHAGOGASTRODUODENOSCOPY  11/05/2010   Procedure: ESOPHAGOGASTRODUODENOSCOPY (EGD);  Surgeon: Rogene Houston, MD;  Location:  [facility];  Service: Endoscopy;  Laterality: N/A;  7:00 am per [facility]  . ESOPHAGOGASTRODUODENOSCOPY N/A 07/25/2012   Procedure: ESOPHAGOGASTRODUODENOSCOPY (EGD);  Surgeon: Rogene Houston, MD;  [facility]: [facility];  Service: Endoscopy;  Laterality: N/A;  400-moved to 37 Ann notified pt  . ESOPHAGOGASTRODUODENOSCOPY N/A 05/27/2015   Procedure: ESOPHAGOGASTRODUODENOSCOPY (EGD);  Surgeon: Rogene Houston, MD;  Location: [facility];  Service: Endoscopy;  Laterality: N/A;  2:00  . HIATAL HERNIA REPAIR N/A 08/25/2015   Procedure: LAPAROSCOPIC REPAIR OF LARGE TYPE  III  HIATAL HERNIA;  Surgeon: Johnathan Hausen, MD;  Location: [facility];  Service: General;  Laterality: N/A;  . LAPAROSCOPIC NISSEN FUNDOPLICATION N/A 07/18/2008   Procedure: ENDOSCOPY, HIATAL HERNIA REPAIR, AND TAKE DOWN OF NISSEN FUNDOPILICATION;  Surgeon: Johnathan Hausen, MD;  Location: [facility];  Service: General;  Laterality: N/A;  . NECK SURGERY     cervical disc  . 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 Colon cancer Mother   . Heart disease Mother   . Arthritis Mother   . Heart disease Father   . Arthritis Father   . Arthritis Sister   . Arthritis Brother   . Arthritis Maternal Aunt   . Arthritis Maternal Uncle   . Diabetes Maternal Uncle    Allergies as of 11/15/2016      Reactions   Hydromorphone Itching, Nausea Only, Other (See Comments)   After IV dilaudid pt flushed and felt dizzy   Codeine Itching   Latex Other (See Comments)   fainted   Morphine And Related Itching   Penicillins Itching, Nausea And Vomiting   Has patient had a PCN reaction causing immediate rash, facial/tongue/throat swelling, SOB or lightheadedness with hypotension: No Has patient had a PCN reaction causing severe rash involving mucus membranes or skin necrosis: No Has patient had a PCN reaction that required hospitalization No Has patient had a PCN reaction occurring within the last 10 years: Yes If all of the above answers are "NO", then may proceed with Cephalosporin use.   Sulfa Antibiotics Nausea And Vomiting      Medication List       Accurate as of 11/15/16 10:51 AM. Always use your most recent med list.          acetaminophen 325 MG tablet Commonly known as:  TYLENOL Take 325 mg by mouth every 6 (six) hours as needed for mild pain (For pain.).   amLODipine 2.5 MG tablet Commonly known as:  NORVASC Take 1 tablet (2.5 mg total) by mouth daily.   levothyroxine 50 MCG  tablet Commonly known as:  SYNTHROID, LEVOTHROID Take 1 tablet (50 mcg total) by mouth daily.   losartan 100 MG tablet Commonly known as:  COZAAR TAKE TABLET BY MOUTH ONCE  DAILY.   Melatonin 5 MG Tabs Take 5 mg by mouth at bedtime.   multivitamin Liqd Take 5 mLs by mouth daily after lunch.   pantoprazole 40 MG tablet Commonly known as:  PROTONIX TAKE 1 TABLET BY MOUTH  TWICE A DAY BEFORE MEALS   sertraline 100 MG tablet Commonly known as:  ZOLOFT Take 1 tablet (100 mg total) by mouth at bedtime.   SUMAtriptan 100 MG tablet Commonly known as:  IMITREX TAKE 1 TABLET BY MOUTH AS  NEEDED FOR MIGRAINE. MAY  REPEAT IN 2 HOURS IF  HEADACHE  PERSISTS OR  RECURS.       All past medical history, surgical history, allergies, family history, immunizations andmedications were updated in the EMR today and reviewed under the history and medication portions of their EMR.     ROS: Negative, with the exception of above mentioned in HPI   Objective:  BP 130/80 (BP Location: Left Arm, Patient Position: Sitting, Cuff Size: Large)   Pulse 76   Temp 98.3 F (36.8 C)   Resp 20   Wt 196 lb 8 oz (89.1 kg)   SpO2 97%   BMI 29.88 kg/m  Body mass index is 29.88 kg/m. Gen: Afebrile. No acute distress. Nontoxic in appearance, well developed, well nourished.  HENT: AT. [place]. MMM, no oral lesions.  Eyes:Pupils Equal Round Reactive to light, Extraocular movements intact,  Conjunctiva without redness, discharge or icterus. CV: RRR, no edema Chest: CTAB, no wheeze or crackles.  Abd: Soft. NTND. BS present Neuro:  Normal gait. PERLA. EOMi. Alert. Oriented x3   No exam data present No results found. No results found for this or any previous visit (from the past 24 hour(s)).  Assessment/Plan: SEPTEMBER MORMILE is a 76 y.o. female present for OV for  Essential hypertension Nonintractable headache, unspecified chronicity pattern, unspecified headache type - Patient was guided to take her blood  pressure at least 1-2 hours after taking medication. If she noticed she is routinely above 135/85 she is to increase to amlodipine 2.5 mg twice a day. -  If she continues to have a morning headache, and blood pressures are above 993 systolic, she can also increase her amlodipine to 2.5 mg twice a day for this reason. Consider maybe she is having a nighttime elevation in her blood pressure, and a nighttime dose of amlodipine before bed may help control his for her. - Instructions were explained, and recommendations ABS. She reports understanding of all instructions. She will call in to let us know if she is needing to increase her amlodipine, so that we may refill for appropriate dose/amount. - Continue routine follow-up.   Reviewed expectations re: course of current medical issues.  Discussed self-management of symptoms.  Outlined signs and symptoms indicating need for more acute intervention.  Patient verbalized understanding and all questions were answered.  Patient received an After-Visit Summary.    No orders of the defined types were placed in this encounter.    Note is dictated utilizing voice recognition software. Although note has been proof read prior to signing, occasional typographical errors still can be missed. If any questions arise, please do not hesitate to call for verification.   electronically signed by:  Howard Pouch, DO  [place]

## 2016-11-15 NOTE — Patient Instructions (Addendum)
Start ALIGN probiotic everyday.  Start B12 tablet daily  Take BP--> 1 hour after medication and when resting. If routinely above 135/85 after 3-4 days of readings, then add another dose of amlodipine before bed.   If you do add it, then call in and let us know pressures and that you added it. I will call in the new increased dose.    Please help [place] help you:  We are honored you have chosen [facility] for your Primary Care home. Below you will find basic instructions that you may need to access in the future. Please help [place] help you by reading the instructions, which cover many of the frequent questions we experience.   Prescription refills and request:  -In order to allow more efficient response time, please call your pharmacy for all refills. They will forward the request electronically to [place]. This allows for the quickest possible response. Request left on a nurse line can take longer to refill, since these are checked as time allows between office patients and other phone calls.  - refill request can take up to 3-5 working days to complete.  - If request is sent electronically and request is appropiate, it is usually completed in 1-2 business days.  - all patients will need to be seen routinely for all chronic medical conditions requiring prescription medications (see follow-up below). If you are overdue for follow up on your condition, you will be asked to make an appointment and we will call in enough medication to cover you until your appointment (up to 30 days).  - all controlled substances will require a face to face visit to request/refill.  - if you desire your prescriptions to go through a new pharmacy, and have an active script at original pharmacy, you will need to call your pharmacy and have scripts transferred to new pharmacy. This is completed between the pharmacy locations and not by your provider.    Results: If any images or labs were ordered, it can take up to 1 week to  get results depending on the test ordered and the lab/facility running and resulting the test. - Normal or stable results, which do not need further discussion, may be released to your mychart immediately with attached note to you. A call may not be generated for normal results. Please make certain to sign up for mychart. If you have questions on how to activate your mychart you can call the front office.  - If your results need further discussion, our office will attempt to contact you via phone, and if unable to reach you after 2 attempts, we will release your abnormal result to your mychart with instructions.  - All results will be automatically released in mychart after 1 week.  - Your provider will provide you with explanation and instruction on all relevant material in your results. Please keep in mind, results and labs may appear confusing or abnormal to the untrained eye, but it does not mean they are actually abnormal for you personally. If you have any questions about your results that are not covered, or you desire more detailed explanation than what was provided, you should make an appointment with your provider to do so.   Our office handles many outgoing and incoming calls daily. If we have not contacted you within 1 week about your results, please check your mychart to see if there is a message first and if not, then contact our office.  In helping with this matter, you  help decrease call volume, and therefore allow [place] to be able to respond to patients needs more efficiently.   Acute office visits (sick visit):  An acute visit is intended for a new problem and are scheduled in shorter time slots to allow schedule openings for patients with new problems. This is the appropriate visit to discuss a new problem. In order to provide you with excellent quality medical care with proper time for you to explain your problem, have an exam and receive treatment with instructions, these appointments  should be limited to one new problem per visit. If you experience a new problem, in which you desire to be addressed, please make an acute office visit, we save openings on the schedule to accommodate you. Please do not save your new problem for any other type of visit, let us take care of it properly and quickly for you.   Follow up visits:  Depending on your condition(s) your provider will need to see you routinely in order to provide you with quality care and prescribe medication(s). Most chronic conditions (Example: hypertension, Diabetes, depression/anxiety... etc), require visits a couple times a year. Your provider will instruct you on proper follow up for your personal medical conditions and history. Please make certain to make follow up appointments for your condition as instructed. Failing to do so could result in lapse in your medication treatment/refills. If you request a refill, and are overdue to be seen on a condition, we will always provide you with a 30 day script (once) to allow you time to schedule.    Medicare wellness (well visit): - we have a wonderful Nurse Maudie Mercury), that will meet with you and provide you will yearly medicare wellness visits. These visits should occur yearly (can not be scheduled less than 1 calendar year apart) and cover preventive health, immunizations, advance directives and screenings you are entitled to yearly through your medicare benefits. Do not miss out on your entitled benefits, this is when medicare will pay for these benefits to be ordered for you.  These are strongly encouraged by your provider and is the appropriate type of visit to make certain you are up to date with all preventive health benefits. If you have not had your medicare wellness exam in the last 12 months, please make certain to schedule one by calling the office and schedule your medicare wellness with Maudie Mercury as soon as possible.   Yearly physical (well visit):  - Adults are recommended to be  seen yearly for physicals. Check with your insurance and date of your last physical, most insurances require one calendar year between physicals. Physicals include all preventive health topics, screenings, medical exam and labs that are appropriate for gender/age and history. You may have fasting labs needed at this visit. This is a well visit (not a sick visit), new problems should not be covered during this visit (see acute visit).  - Pediatric patients are seen more frequently when they are younger. Your provider will advise you on well child visit timing that is appropriate for your their age. - This is not a medicare wellness visit. Medicare wellness exams do not have an exam portion to the visit. Some medicare companies allow for a physical, some do not allow a yearly physical. If your medicare allows a yearly physical you can schedule the medicare wellness with our nurse Maudie Mercury and have your physical with your provider after, on the same day. Please check with insurance for your full benefits.   Late  Policy/No Shows:  - all new patients should arrive 15-30 minutes earlier than appointment to allow [place] time  to  obtain all personal demographics,  insurance information and for you to complete office paperwork. - All established patients should arrive 10-15 minutes earlier than appointment time to update all information and be checked in .  - In our best efforts to run on time, if you are late for your appointment you will be asked to either reschedule or if able, we will work you back into the schedule. There will be a wait time to work you back in the schedule,  depending on availability.  - If you are unable to make it to your appointment as scheduled, please call 24 hours ahead of time to allow [place] to fill the time slot with someone else who needs to be seen. If you do not cancel your appointment ahead of time, you may be charged a no show fee.

## 2016-11-18 DIAGNOSIS — R51 Headache: Secondary | ICD-10-CM

## 2016-11-18 DIAGNOSIS — R519 Headache, unspecified: Secondary | ICD-10-CM | POA: Insufficient documentation

## 2016-11-21 ENCOUNTER — Telehealth: Payer: Self-pay | Admitting: Family Medicine

## 2016-11-21 MED ORDER — AMLODIPINE BESYLATE 2.5 MG PO TABS
2.5000 mg | ORAL_TABLET | Freq: Two times a day (BID) | ORAL | 0 refills | Status: DC
Start: 2016-11-21 — End: 2016-11-24

## 2016-11-21 NOTE — Telephone Encounter (Signed)
Patient was told to call to let Dr Raoul Pitch know her BP readings.   08/11 137/67 a few hours after medication 08/12  138/82   These are the only BP readings that she took.  Patient also said she has no started taking medication at night yet.

## 2016-11-21 NOTE — Telephone Encounter (Signed)
Spoke with patient reviewed  instructions. Patient verbalized understanding. 

## 2016-11-21 NOTE — Telephone Encounter (Signed)
Please return pts call. I would recommend she go ahead and start the additional dose of amlodipine at night as we discussed during her visit. With her reported BP readings we have the room to add that additional low dose.  Have her continue monitoring BP a few times a week. Goal BP between 115-135/ 70-85.  - I will call in corrected amount for next refills on her amlodopine.

## 2016-11-24 ENCOUNTER — Other Ambulatory Visit: Payer: Self-pay | Admitting: *Deleted

## 2016-11-24 MED ORDER — AMLODIPINE BESYLATE 2.5 MG PO TABS: 2.5000 mg | ORAL_TABLET | Freq: Two times a day (BID) | ORAL | 0 refills | Status: DC

## 2016-11-25 ENCOUNTER — Telehealth: Payer: Self-pay

## 2016-11-25 NOTE — Telephone Encounter (Signed)
Patient called yesterday leaving a message on voice mail inquiring about amlodipine refill.  Returned call and informed patient that medication had been called in to [facility].  Patient verbalized understanding.

## 2016-11-26 DIAGNOSIS — Z79899 Other long term (current) drug therapy: Secondary | ICD-10-CM | POA: Diagnosis not present

## 2016-11-26 DIAGNOSIS — S61011A Laceration without foreign body of right thumb without damage to nail, initial encounter: Secondary | ICD-10-CM | POA: Diagnosis not present

## 2016-11-26 DIAGNOSIS — K219 Gastro-esophageal reflux disease without esophagitis: Secondary | ICD-10-CM | POA: Diagnosis not present

## 2016-11-26 DIAGNOSIS — W312XXA Contact with powered woodworking and forming machines, initial encounter: Secondary | ICD-10-CM | POA: Diagnosis not present

## 2016-11-26 DIAGNOSIS — Z23 Encounter for immunization: Secondary | ICD-10-CM | POA: Diagnosis not present

## 2016-12-26 ENCOUNTER — Other Ambulatory Visit: Payer: Self-pay | Admitting: Family Medicine

## 2017-01-02 ENCOUNTER — Telehealth: Payer: Self-pay | Admitting: Family Medicine

## 2017-01-02 NOTE — Telephone Encounter (Signed)
Patient request Rx to [facility] for Protonix 40MG .

## 2017-01-02 NOTE — Telephone Encounter (Signed)
Rx was sent to [facility] on 12/26/16 notified patient.

## 2017-01-10 ENCOUNTER — Telehealth: Payer: Self-pay

## 2017-01-10 NOTE — Telephone Encounter (Signed)
LM requesting call back regarding AWV. Requesting to complete AWV with [facility] on 01/16/17 @ 1015, prior to appt with PCP

## 2017-01-12 DIAGNOSIS — H524 Presbyopia: Secondary | ICD-10-CM | POA: Diagnosis not present

## 2017-01-12 DIAGNOSIS — H43391 Other vitreous opacities, right eye: Secondary | ICD-10-CM | POA: Diagnosis not present

## 2017-01-16 ENCOUNTER — Ambulatory Visit (INDEPENDENT_AMBULATORY_CARE_PROVIDER_SITE_OTHER): Payer: Medicare Other | Admitting: Family Medicine

## 2017-01-16 ENCOUNTER — Encounter: Payer: Self-pay | Admitting: Family Medicine

## 2017-01-16 VITALS — BP 115/80 | HR 68 | Temp 97.9°F | Resp 20 | Ht 68.0 in | Wt 197.2 lb

## 2017-01-16 DIAGNOSIS — Z23 Encounter for immunization: Secondary | ICD-10-CM | POA: Diagnosis not present

## 2017-01-16 DIAGNOSIS — K449 Diaphragmatic hernia without obstruction or gangrene: Secondary | ICD-10-CM

## 2017-01-16 DIAGNOSIS — G4700 Insomnia, unspecified: Secondary | ICD-10-CM | POA: Diagnosis not present

## 2017-01-16 DIAGNOSIS — I10 Essential (primary) hypertension: Secondary | ICD-10-CM | POA: Diagnosis not present

## 2017-01-16 DIAGNOSIS — K219 Gastro-esophageal reflux disease without esophagitis: Secondary | ICD-10-CM

## 2017-01-16 DIAGNOSIS — F419 Anxiety disorder, unspecified: Secondary | ICD-10-CM | POA: Diagnosis not present

## 2017-01-16 DIAGNOSIS — E039 Hypothyroidism, unspecified: Secondary | ICD-10-CM | POA: Diagnosis not present

## 2017-01-16 LAB — HEMOGLOBIN A1C: Hgb A1c MFr Bld: 5.6 % (ref 4.6–6.5)

## 2017-01-16 LAB — TSH: TSH: 2.22 u[IU]/mL (ref 0.35–4.50)

## 2017-01-16 MED ORDER — AMLODIPINE BESYLATE 2.5 MG PO TABS: 2.5000 mg | ORAL_TABLET | Freq: Two times a day (BID) | ORAL | 1 refills | Status: DC

## 2017-01-16 MED ORDER — LOSARTAN POTASSIUM 100 MG PO TABS: ORAL_TABLET | ORAL | 1 refills | Status: DC

## 2017-01-16 MED ORDER — PANTOPRAZOLE SODIUM 40 MG PO TBEC: DELAYED_RELEASE_TABLET | ORAL | 1 refills | Status: DC

## 2017-01-16 MED ORDER — LEVOTHYROXINE SODIUM 50 MCG PO TABS: 50.0000 ug | ORAL_TABLET | Freq: Every day | ORAL | 3 refills | Status: DC

## 2017-01-16 MED ORDER — SERTRALINE HCL 100 MG PO TABS: 100.0000 mg | ORAL_TABLET | Freq: Every day | ORAL | 1 refills | Status: DC

## 2017-01-16 NOTE — Progress Notes (Signed)
Debra Sanchez , 04-04-1941, 76 y.o., female MRN: [id number] Patient Care Team    Relationship Specialty Notifications Start End  Ma Hillock, DO PCP - General Family Medicine  04/20/15     Chief Complaint  Patient presents with  . Hypertension  . Hypothyroidism  . Insomnia    Subjective:  Hypothyroidism, unspecified type Reports compliance with levothyroxine 50 mcg daily. TSH 1.39 01/2016. Patient needs refills on medications today. She reports she is feeling well.  Essential hypertension Pt reports compliance with Cozaar 100 mg and amlodipine 2.5 g twice a day. Blood pressures ranges at home normal recorded after medication use. Patient denies chest pain, shortness of breath or lower extremity edema. Pt does not take a daily baby ASA.  BMP: 06/18/2016, within normal limits CBC: 06/18/2016 with hemoglobin 11.5, hematocrit 34.7 Lipids: Collected 2016 Diet: Watches her diet closely Exercise: Exercises routinely RF: Hypertension, obesity, family history heart disease   Anxiety/Insomnia, unspecified type: Patient reports compliance with Zoloft daily. She states she feels well on this dose of medication. She does need refills today on her medications.  Prior note: Patient states she is doing well on zoloft. She does have increased stress right now with her granddaughter, but [place] managing. Granddaughter is using drugs, and they recently had to  take her kids off her. She reports compliance with zoloft. She denies side effects to medications. She does need refills today.    Allergies  Allergen Reactions  . Hydromorphone Itching, Nausea Only and Other (See Comments)    After IV dilaudid pt flushed and felt dizzy  . Codeine Itching  . Latex Other (See Comments)    fainted  . Morphine And Related Itching  . Penicillins Itching and Nausea And Vomiting    Has patient had a PCN reaction causing immediate rash, facial/tongue/throat swelling, SOB or lightheadedness with  hypotension: No Has patient had a PCN reaction causing severe rash involving mucus membranes or skin necrosis: No Has patient had a PCN reaction that required hospitalization No Has patient had a PCN reaction occurring within the last 10 years: Yes If all of the above answers are "NO", then may proceed with Cephalosporin use.   . Sulfa Antibiotics Nausea And Vomiting   Social History  Substance Use Topics  . Smoking status: Never Smoker  . Smokeless tobacco: Never Used  . Alcohol use No   Past Medical History:  Diagnosis Date  . Anemia   . Anxiety   . Arthritis   . Blood transfusion 2012  . Colon polyp   . DDD (degenerative disc disease), thoracolumbar   . Degenerative disc disease, lumbar   . Family history of adverse reaction to anesthesia    pts sister got very sick / burnt esophagus   . Gastric ulcer   . GERD (gastroesophageal reflux disease)   . Heart murmur   . History of hiatal hernia   . Hypertension   . Hypothyroidism   . Migraines   . Miscarriage   . Osteoporosis   . Pneumonia yrs ago  . Syncope 2012  . Wears glasses    Past Surgical History:  Procedure Laterality Date  . ABDOMINAL HYSTERECTOMY    . BILATERAL OOPHORECTOMY    . CATARACT EXTRACTION W/PHACO Left 10/20/2014   Procedure: CATARACT EXTRACTION PHACO AND INTRAOCULAR LENS PLACEMENT LEFT EYE;  Surgeon: Tonny Branch, MD;  Location: [facility];  Service: Ophthalmology;  Laterality: Left;  CDE:6.98  . CATARACT EXTRACTION W/PHACO Right 11/17/2014  Procedure: CATARACT EXTRACTION PHACO AND INTRAOCULAR LENS PLACEMENT RIGHT EYE CDE=6.57;  Surgeon: Tonny Branch, MD;  Location: [facility];  Service: Ophthalmology;  Laterality: Right;  . COLONOSCOPY  01/19/2011   Procedure: COLONOSCOPY;  Surgeon: Rogene Houston, MD;  Location: [facility];  Service: Endoscopy;  Laterality: N/A;  2:00  . DILATION AND CURETTAGE OF UTERUS    . ESOPHAGOGASTRODUODENOSCOPY  11/05/2010   Procedure: ESOPHAGOGASTRODUODENOSCOPY (EGD);  Surgeon:  Rogene Houston, MD;  Location: [facility];  Service: Endoscopy;  Laterality: N/A;  7:00 am per [facility]  . ESOPHAGOGASTRODUODENOSCOPY N/A 07/25/2012   Procedure: ESOPHAGOGASTRODUODENOSCOPY (EGD);  Surgeon: Rogene Houston, MD;  Location: [facility];  Service: Endoscopy;  Laterality: N/A;  400-moved to 74 Ann notified pt  . ESOPHAGOGASTRODUODENOSCOPY N/A 05/27/2015   Procedure: ESOPHAGOGASTRODUODENOSCOPY (EGD);  Surgeon: Rogene Houston, MD;  Location: [facility];  Service: Endoscopy;  Laterality: N/A;  2:00  . HIATAL HERNIA REPAIR N/A 08/25/2015   Procedure: LAPAROSCOPIC REPAIR OF LARGE TYPE III  HIATAL HERNIA;  Surgeon: Johnathan Hausen, MD;  Location: [facility];  Service: General;  Laterality: N/A;  . LAPAROSCOPIC NISSEN FUNDOPLICATION N/A 08/12/2990   Procedure: ENDOSCOPY, HIATAL HERNIA REPAIR, AND TAKE DOWN OF NISSEN FUNDOPILICATION;  Surgeon: Johnathan Hausen, MD;  Location: [facility];  Service: General;  Laterality: N/A;  . NECK SURGERY     cervical disc  . 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 Colon cancer Mother   . Heart disease Mother   . Arthritis Mother   . Heart disease Father   . Arthritis Father   . Arthritis Sister   . Arthritis Brother   . Arthritis Maternal Aunt   . Arthritis Maternal Uncle   . Diabetes Maternal Uncle    Allergies as of 01/16/2017      Reactions   Hydromorphone Itching, Nausea Only, Other (See Comments)   After IV dilaudid pt flushed and felt dizzy   Codeine Itching   Latex Other (See Comments)   fainted   Morphine And Related Itching   Penicillins Itching, Nausea And Vomiting   Has patient had a PCN reaction causing immediate rash, facial/tongue/throat swelling, SOB or lightheadedness with hypotension: No Has patient had a PCN reaction causing severe rash involving mucus membranes or skin necrosis: No Has patient had a PCN reaction that  required hospitalization No Has patient had a PCN reaction occurring within the last 10 years: Yes If all of the above answers are "NO", then may proceed with Cephalosporin use.   Sulfa Antibiotics Nausea And Vomiting      Medication List       Accurate as of 01/16/17 11:26 AM. Always use your most recent med list.          acetaminophen 325 MG tablet Commonly known as:  TYLENOL Take 325 mg by mouth every 6 (six) hours as needed for mild pain (For pain.).   amLODipine 2.5 MG tablet Commonly known as:  NORVASC Take 1 tablet (2.5 mg total) by mouth 2 (two) times daily.   levothyroxine 50 MCG tablet Commonly known as:  SYNTHROID, LEVOTHROID TAKE 1 TABLET BY MOUTH  DAILY   losartan 100 MG tablet Commonly known as:  COZAAR TAKE TABLET BY MOUTH ONCE  DAILY.   Melatonin 5 MG Tabs Take 5 mg by mouth at bedtime.   multivitamin  Liqd Take 5 mLs by mouth daily after lunch.   pantoprazole 40 MG tablet Commonly known as:  PROTONIX TAKE 1 TABLET BY MOUTH  TWICE A DAY BEFORE MEALS   sertraline 100 MG tablet Commonly known as:  ZOLOFT TAKE 1 TABLET BY MOUTH AT  BEDTIME   SUMAtriptan 100 MG tablet Commonly known as:  IMITREX TAKE 1 TABLET BY MOUTH AS  NEEDED FOR MIGRAINE. MAY  REPEAT IN 2 HOURS IF  HEADACHE PERSISTS OR  RECURS.       No results found for this or any previous visit (from the past 24 hour(s)). No results found.   ROS: Negative, with the exception of above mentioned in HPI   Objective:  BP 115/80 (BP Location: Left Arm, Patient Position: Sitting, Cuff Size: Large)   Pulse 68   Temp 97.9 F (36.6 C)   Resp 20   Ht 5\' 8"  (1.727 m)   Wt 197 lb 4 oz (89.5 kg)   SpO2 97%   BMI 29.99 kg/m  Body mass index is 29.99 kg/m. Gen: Afebrile. No acute distress. Nontoxic in appearance, well-developed, well-nourished, Caucasian female. HENT: AT. [place].  MMM. Eyes:Pupils Equal Round Reactive to light, Extraocular movements intact,  Conjunctiva without redness,  discharge or icterus. Neck/lymp/endocrine: Supple, no lymphadenopathy, no thyromegaly CV: RRR 1/6 systolic murmur, no edema, +2/4 P posterior tibialis pulses Chest: CTAB, no wheeze or crackles Abd: Soft. NTND. BS present. No Masses palpated.  Neuro:  Normal gait. PERLA. EOMi. Alert. Oriented.  Psych: Normal affect, dress and demeanor. Normal speech. Normal thought content and judgment..    Assessment/Plan: Debra Sanchez is a 76 y.o. female present for OV for  Hypothyroidism, unspecified type - TSH collected today. Refills on Synthroid 50 g daily will be provided or altered depending upon results. - follow yearly.   Essential hypertension/BMI 29 - Stable today - continue low sodium, and exercise.   - Continue with refills on amlodipine 2.5 mg twice a day and losartan 100 mg QD. - F/U 6 months if remains stable, sooner if medication adjustment needed.   Anxiety /Insomnia, unspecified type - Stable today. Continue Zoloft 100 mg daily, refills provided today. - F/u 6 months  GERD: Stable. Refills on Protonix provided.  Flu shot provided today.  electronically signed by:  Howard Pouch, DO  [facility] - [place]    '

## 2017-01-16 NOTE — Patient Instructions (Addendum)
Next appt try to remember to fast 9 hours, you can drink water.  Next appointment bring your blood pressure cuff so we can check it against ours.   Take the amlodipine at night (5mg  total).   6 months follow up   Please help [place] help you:  We are honored you have chosen [facility] for your Primary Care home. Below you will find basic instructions that you may need to access in the future. Please help [place] help you by reading the instructions, which cover many of the frequent questions we experience.   Prescription refills and request:  -In order to allow more efficient response time, please call your pharmacy for all refills. They will forward the request electronically to [place]. This allows for the quickest possible response. Request left on a nurse line can take longer to refill, since these are checked as time allows between office patients and other phone calls.  - refill request can take up to 3-5 working days to complete.  - If request is sent electronically and request is appropiate, it is usually completed in 1-2 business days.  - all patients will need to be seen routinely for all chronic medical conditions requiring prescription medications (see follow-up below). If you are overdue for follow up on your condition, you will be asked to make an appointment and we will call in enough medication to cover you until your appointment (up to 30 days).  - all controlled substances will require a face to face visit to request/refill.  - if you desire your prescriptions to go through a new pharmacy, and have an active script at original pharmacy, you will need to call your pharmacy and have scripts transferred to new pharmacy. This is completed between the pharmacy locations and not by your provider.    Results: If any images or labs were ordered, it can take up to 1 week to get results depending on the test ordered and the lab/facility running and resulting the test. - Normal or stable results,  which do not need further discussion, may be released to your mychart immediately with attached note to you. A call may not be generated for normal results. Please make certain to sign up for mychart. If you have questions on how to activate your mychart you can call the front office.  - If your results need further discussion, our office will attempt to contact you via phone, and if unable to reach you after 2 attempts, we will release your abnormal result to your mychart with instructions.  - All results will be automatically released in mychart after 1 week.  - Your provider will provide you with explanation and instruction on all relevant material in your results. Please keep in mind, results and labs may appear confusing or abnormal to the untrained eye, but it does not mean they are actually abnormal for you personally. If you have any questions about your results that are not covered, or you desire more detailed explanation than what was provided, you should make an appointment with your provider to do so.   Our office handles many outgoing and incoming calls daily. If we have not contacted you within 1 week about your results, please check your mychart to see if there is a message first and if not, then contact our office.  In helping with this matter, you help decrease call volume, and therefore allow [place] to be able to respond to patients needs more efficiently.   Acute office  visits (sick visit):  An acute visit is intended for a new problem and are scheduled in shorter time slots to allow schedule openings for patients with new problems. This is the appropriate visit to discuss a new problem. In order to provide you with excellent quality medical care with proper time for you to explain your problem, have an exam and receive treatment with instructions, these appointments should be limited to one new problem per visit. If you experience a new problem, in which you desire to be addressed, please  make an acute office visit, we save openings on the schedule to accommodate you. Please do not save your new problem for any other type of visit, let us take care of it properly and quickly for you.   Follow up visits:  Depending on your condition(s) your provider will need to see you routinely in order to provide you with quality care and prescribe medication(s). Most chronic conditions (Example: hypertension, Diabetes, depression/anxiety... etc), require visits a couple times a year. Your provider will instruct you on proper follow up for your personal medical conditions and history. Please make certain to make follow up appointments for your condition as instructed. Failing to do so could result in lapse in your medication treatment/refills. If you request a refill, and are overdue to be seen on a condition, we will always provide you with a 30 day script (once) to allow you time to schedule.    Medicare wellness (well visit): - we have a wonderful Nurse Maudie Mercury), that will meet with you and provide you will yearly medicare wellness visits. These visits should occur yearly (can not be scheduled less than 1 calendar year apart) and cover preventive health, immunizations, advance directives and screenings you are entitled to yearly through your medicare benefits. Do not miss out on your entitled benefits, this is when medicare will pay for these benefits to be ordered for you.  These are strongly encouraged by your provider and is the appropriate type of visit to make certain you are up to date with all preventive health benefits. If you have not had your medicare wellness exam in the last 12 months, please make certain to schedule one by calling the office and schedule your medicare wellness with Maudie Mercury as soon as possible.   Yearly physical (well visit):  - Adults are recommended to be seen yearly for physicals. Check with your insurance and date of your last physical, most insurances require one calendar  year between physicals. Physicals include all preventive health topics, screenings, medical exam and labs that are appropriate for gender/age and history. You may have fasting labs needed at this visit. This is a well visit (not a sick visit), new problems should not be covered during this visit (see acute visit).  - Pediatric patients are seen more frequently when they are younger. Your provider will advise you on well child visit timing that is appropriate for your their age. - This is not a medicare wellness visit. Medicare wellness exams do not have an exam portion to the visit. Some medicare companies allow for a physical, some do not allow a yearly physical. If your medicare allows a yearly physical you can schedule the medicare wellness with our nurse Maudie Mercury and have your physical with your provider after, on the same day. Please check with insurance for your full benefits.   Late Policy/No Shows:  - all new patients should arrive 15-30 minutes earlier than appointment to allow [place] time  to  obtain  all personal demographics,  insurance information and for you to complete office paperwork. - All established patients should arrive 10-15 minutes earlier than appointment time to update all information and be checked in .  - In our best efforts to run on time, if you are late for your appointment you will be asked to either reschedule or if able, we will work you back into the schedule. There will be a wait time to work you back in the schedule,  depending on availability.  - If you are unable to make it to your appointment as scheduled, please call 24 hours ahead of time to allow [place] to fill the time slot with someone else who needs to be seen. If you do not cancel your appointment ahead of time, you may be charged a no show fee.

## 2017-02-21 ENCOUNTER — Ambulatory Visit (INDEPENDENT_AMBULATORY_CARE_PROVIDER_SITE_OTHER): Payer: Medicare Other | Admitting: Internal Medicine

## 2017-02-24 ENCOUNTER — Ambulatory Visit (INDEPENDENT_AMBULATORY_CARE_PROVIDER_SITE_OTHER): Payer: Medicare Other | Admitting: Internal Medicine

## 2017-02-24 ENCOUNTER — Encounter (INDEPENDENT_AMBULATORY_CARE_PROVIDER_SITE_OTHER): Payer: Self-pay | Admitting: Internal Medicine

## 2017-02-24 ENCOUNTER — Encounter (INDEPENDENT_AMBULATORY_CARE_PROVIDER_SITE_OTHER): Payer: Self-pay | Admitting: *Deleted

## 2017-02-24 VITALS — BP 128/64 | HR 76 | Temp 98.3°F | Ht 68.0 in | Wt 199.2 lb

## 2017-02-24 DIAGNOSIS — R1310 Dysphagia, unspecified: Secondary | ICD-10-CM

## 2017-02-24 DIAGNOSIS — R1319 Other dysphagia: Secondary | ICD-10-CM

## 2017-02-24 MED ORDER — SUCRALFATE 1 GM/10ML PO SUSP: 1.0000 g | Freq: Four times a day (QID) | ORAL | 3 refills | Status: DC

## 2017-02-24 NOTE — Progress Notes (Signed)
Subjective:    Patient ID: Debra Sanchez, female    DOB: Jun 27, 1940, 76 y.o.   MRN: [id number]  HPI  Presents today with c/o nausea. On 06/17/2016 underwent esophageal foreshortening, takedown Nissen fundoplication and closure of the hiatus, upper endoscopy by Dr. Windle Guard and gastroplexy by Dr. Johnathan Hausen.  After her second surgery, she would not let herself get sick.  She would eat soft foods. She tells me she is having some dysphagia. She also has nausea and vomiting. She can keep Ensure down.  She feels like food are lodging in her esophagus.    .   ON 08/18/2016 underwent a DG UGI which revealed  IMPRESSION: Moderate-sized recurrent hiatal hernia, slightly smaller than that seen on the previous exam.    Review of Systems Past Medical History:  Diagnosis Date  . Anemia   . Anxiety   . Arthritis   . Blood transfusion 2012  . Colon polyp   . DDD (degenerative disc disease), thoracolumbar   . Degenerative disc disease, lumbar   . Family history of adverse reaction to anesthesia    pts sister got very sick / burnt esophagus   . Gastric ulcer   . GERD (gastroesophageal reflux disease)   . Heart murmur   . History of hiatal hernia   . Hypertension   . Hypothyroidism   . Migraines   . Miscarriage   . Osteoporosis   . Pneumonia yrs ago  . Syncope 2012  . Wears glasses     Past Surgical History:  Procedure Laterality Date  . ABDOMINAL HYSTERECTOMY    . BILATERAL OOPHORECTOMY    . CATARACT EXTRACTION PHACO AND INTRAOCULAR LENS PLACEMENT LEFT EYE Left 10/20/2014   Performed by Tonny Branch, MD at [facility]  . CATARACT EXTRACTION PHACO AND INTRAOCULAR LENS PLACEMENT RIGHT EYE CDE=6.57 Right 11/17/2014   Performed by Tonny Branch, MD at [facility]  . COLONOSCOPY N/A 01/19/2011   Performed by Rogene Houston, MD at [facility]  . DILATION AND CURETTAGE OF UTERUS    . ENDOSCOPY, HIATAL HERNIA REPAIR, AND TAKE DOWN OF NISSEN FUNDOPILICATION N/A 07/14/8183   Performed by Johnathan Hausen, MD at [facility]  . ESOPHAGOGASTRODUODENOSCOPY (EGD) N/A 05/27/2015   Performed by Rogene Houston, MD at [facility]  . ESOPHAGOGASTRODUODENOSCOPY (EGD) N/A 07/25/2012   Performed by Rogene Houston, MD at [facility]  . ESOPHAGOGASTRODUODENOSCOPY (EGD) N/A 11/05/2010   Performed by Rogene Houston, MD at [facility]  . LAPAROSCOPIC REPAIR OF LARGE TYPE III  HIATAL HERNIA N/A 08/25/2015   Performed by Johnathan Hausen, MD at [facility]  . NECK SURGERY     cervical disc  . 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 Hydromorphone Itching, Nausea Only and Other (See Comments)    After IV dilaudid pt flushed and felt dizzy  . Codeine Itching  . Latex Other (See Comments)    fainted  . Morphine And Related Itching  . Penicillins Itching and Nausea And Vomiting    Has patient had a PCN reaction causing immediate rash, facial/tongue/throat swelling, SOB or lightheadedness with hypotension: No Has patient had a PCN reaction causing severe rash involving mucus membranes or skin necrosis: No Has patient had a PCN reaction that required hospitalization No Has patient had a PCN reaction occurring within the last  10 years: Yes If all of the above answers are "NO", then may proceed with Cephalosporin use.   . Sulfa Antibiotics Nausea And Vomiting    Current Outpatient Medications on File Prior to Visit  Medication Sig Dispense Refill  . acetaminophen (TYLENOL) 325 MG tablet Take 325 mg by mouth every 6 (six) hours as needed for mild pain (For pain.).     Marland Kitchen amLODipine (NORVASC) 2.5 MG tablet Take 1 tablet (2.5 mg total) by mouth 2 (two) times daily. 180 tablet 1  . levothyroxine (SYNTHROID, LEVOTHROID) 50 MCG tablet Take 1 tablet (50 mcg total) by mouth daily. 90 tablet 3  . losartan (COZAAR) 100 MG tablet TAKE TABLET BY MOUTH ONCE  DAILY. 90 tablet 1  . Melatonin 5 MG TABS Take 5 mg by mouth at  bedtime.    . Multiple Vitamin (MULTIVITAMIN) LIQD Take 5 mLs by mouth daily after lunch.    . pantoprazole (PROTONIX) 40 MG tablet TAKE 1 TABLET BY MOUTH  TWICE A DAY BEFORE MEALS 180 tablet 1  . sertraline (ZOLOFT) 100 MG tablet Take 1 tablet (100 mg total) by mouth at bedtime. 90 tablet 1  . SUMAtriptan (IMITREX) 100 MG tablet TAKE 1 TABLET BY MOUTH AS  NEEDED FOR MIGRAINE. MAY  REPEAT IN 2 HOURS IF  HEADACHE PERSISTS OR  RECURS. 10 tablet 0   No current facility-administered medications on file prior to visit.         Objective:   Physical Exam Blood pressure 128/64, pulse 76, temperature 98.3 F (36.8 C), height 5\' 8"  (1.727 m), weight 199 lb 3.2 oz (90.4 kg). Alert and oriented. Skin warm and dry. Oral mucosa is moist.   . Sclera anicteric, conjunctivae is pink. Thyroid not enlarged. No cervical lymphadenopathy. Lungs clear. Heart regular rate and rhythm.  Abdomen is soft. Bowel sounds are positive. No hepatomegaly. No abdominal masses felt. No tenderness.  No edema to lower extremities. P          Assessment & Plan:  Dysphagia. Am going to get an Esophagram.  Rx for Carafate sent to her pharmacy.

## 2017-02-24 NOTE — Patient Instructions (Signed)
DG Esophagram.  

## 2017-02-27 ENCOUNTER — Ambulatory Visit (HOSPITAL_COMMUNITY): Payer: Medicare Other

## 2017-03-27 DIAGNOSIS — Z800 Family history of malignant neoplasm of digestive organs: Secondary | ICD-10-CM | POA: Diagnosis not present

## 2017-03-27 DIAGNOSIS — G4700 Insomnia, unspecified: Secondary | ICD-10-CM | POA: Diagnosis not present

## 2017-03-27 DIAGNOSIS — Z91040 Latex allergy status: Secondary | ICD-10-CM | POA: Diagnosis not present

## 2017-03-27 DIAGNOSIS — Z882 Allergy status to sulfonamides status: Secondary | ICD-10-CM | POA: Diagnosis not present

## 2017-03-27 DIAGNOSIS — Z8249 Family history of ischemic heart disease and other diseases of the circulatory system: Secondary | ICD-10-CM | POA: Diagnosis not present

## 2017-03-27 DIAGNOSIS — I10 Essential (primary) hypertension: Secondary | ICD-10-CM | POA: Diagnosis not present

## 2017-03-27 DIAGNOSIS — R933 Abnormal findings on diagnostic imaging of other parts of digestive tract: Secondary | ICD-10-CM | POA: Diagnosis not present

## 2017-03-27 DIAGNOSIS — E039 Hypothyroidism, unspecified: Secondary | ICD-10-CM | POA: Diagnosis not present

## 2017-03-27 DIAGNOSIS — K219 Gastro-esophageal reflux disease without esophagitis: Secondary | ICD-10-CM | POA: Diagnosis not present

## 2017-03-27 DIAGNOSIS — K449 Diaphragmatic hernia without obstruction or gangrene: Secondary | ICD-10-CM | POA: Diagnosis not present

## 2017-03-27 DIAGNOSIS — K210 Gastro-esophageal reflux disease with esophagitis: Secondary | ICD-10-CM | POA: Diagnosis not present

## 2017-03-27 DIAGNOSIS — Z79899 Other long term (current) drug therapy: Secondary | ICD-10-CM | POA: Diagnosis not present

## 2017-03-27 DIAGNOSIS — Z885 Allergy status to narcotic agent status: Secondary | ICD-10-CM | POA: Diagnosis not present

## 2017-03-27 DIAGNOSIS — Z880 Allergy status to penicillin: Secondary | ICD-10-CM | POA: Diagnosis not present

## 2017-03-31 DIAGNOSIS — K219 Gastro-esophageal reflux disease without esophagitis: Secondary | ICD-10-CM | POA: Diagnosis not present

## 2017-03-31 DIAGNOSIS — K210 Gastro-esophageal reflux disease with esophagitis: Secondary | ICD-10-CM | POA: Diagnosis not present

## 2017-03-31 DIAGNOSIS — K449 Diaphragmatic hernia without obstruction or gangrene: Secondary | ICD-10-CM | POA: Diagnosis not present

## 2017-04-13 DIAGNOSIS — K31819 Angiodysplasia of stomach and duodenum without bleeding: Secondary | ICD-10-CM | POA: Diagnosis not present

## 2017-04-13 DIAGNOSIS — K210 Gastro-esophageal reflux disease with esophagitis: Secondary | ICD-10-CM | POA: Diagnosis not present

## 2017-04-13 DIAGNOSIS — K222 Esophageal obstruction: Secondary | ICD-10-CM | POA: Diagnosis not present

## 2017-04-13 DIAGNOSIS — I890 Lymphedema, not elsewhere classified: Secondary | ICD-10-CM | POA: Diagnosis not present

## 2017-04-13 DIAGNOSIS — I10 Essential (primary) hypertension: Secondary | ICD-10-CM | POA: Diagnosis not present

## 2017-04-13 DIAGNOSIS — K449 Diaphragmatic hernia without obstruction or gangrene: Secondary | ICD-10-CM | POA: Diagnosis not present

## 2017-04-13 DIAGNOSIS — R112 Nausea with vomiting, unspecified: Secondary | ICD-10-CM | POA: Diagnosis not present

## 2017-04-13 DIAGNOSIS — Z882 Allergy status to sulfonamides status: Secondary | ICD-10-CM | POA: Diagnosis not present

## 2017-04-13 DIAGNOSIS — K3189 Other diseases of stomach and duodenum: Secondary | ICD-10-CM | POA: Diagnosis not present

## 2017-04-13 DIAGNOSIS — Z885 Allergy status to narcotic agent status: Secondary | ICD-10-CM | POA: Diagnosis not present

## 2017-04-13 DIAGNOSIS — E039 Hypothyroidism, unspecified: Secondary | ICD-10-CM | POA: Diagnosis not present

## 2017-04-21 DIAGNOSIS — K449 Diaphragmatic hernia without obstruction or gangrene: Secondary | ICD-10-CM | POA: Diagnosis not present

## 2017-04-24 ENCOUNTER — Other Ambulatory Visit (INDEPENDENT_AMBULATORY_CARE_PROVIDER_SITE_OTHER): Payer: Self-pay | Admitting: Internal Medicine

## 2017-04-24 MED ORDER — DEXLANSOPRAZOLE 60 MG PO CPDR: 60.0000 mg | DELAYED_RELEASE_CAPSULE | Freq: Every day | ORAL | 2 refills | Status: DC

## 2017-04-24 NOTE — Progress Notes (Unsigned)
dexilant

## 2017-04-25 ENCOUNTER — Other Ambulatory Visit (INDEPENDENT_AMBULATORY_CARE_PROVIDER_SITE_OTHER): Payer: Self-pay | Admitting: Internal Medicine

## 2017-04-25 ENCOUNTER — Telehealth (INDEPENDENT_AMBULATORY_CARE_PROVIDER_SITE_OTHER): Payer: Self-pay | Admitting: *Deleted

## 2017-04-25 MED ORDER — ESOMEPRAZOLE MAGNESIUM 40 MG PO CPDR: 40.0000 mg | DELAYED_RELEASE_CAPSULE | Freq: Two times a day (BID) | ORAL | 5 refills | Status: DC

## 2017-04-25 NOTE — Telephone Encounter (Signed)
Patient had wanted [place] to call her RX to another Pharmacy due to the cost , after calling her to confirm she ask that we not do that as the cost will be the same.She is asking that Dr.Rehman call her something cheaper. And request that Dr.Rehman call her back, as she has a question.

## 2017-04-25 NOTE — Telephone Encounter (Signed)
Call returned. PI prescription changed to Nexium 40 mg p.o. twice daily. Patient is not sure if she wants to proceed with surgery. Past Dr. Cammie Sickle to send her records to our office for review. Reviewed records as well as upper GI series and make further recommendations.

## 2017-05-01 ENCOUNTER — Other Ambulatory Visit: Payer: Self-pay | Admitting: Family Medicine

## 2017-05-31 ENCOUNTER — Ambulatory Visit (INDEPENDENT_AMBULATORY_CARE_PROVIDER_SITE_OTHER): Payer: Medicare Other | Admitting: Family Medicine

## 2017-05-31 ENCOUNTER — Encounter: Payer: Self-pay | Admitting: Family Medicine

## 2017-05-31 VITALS — BP 113/76 | HR 76 | Temp 98.1°F | Ht 68.0 in | Wt 197.8 lb

## 2017-05-31 DIAGNOSIS — Z5181 Encounter for therapeutic drug level monitoring: Secondary | ICD-10-CM | POA: Diagnosis not present

## 2017-05-31 DIAGNOSIS — K219 Gastro-esophageal reflux disease without esophagitis: Secondary | ICD-10-CM | POA: Diagnosis not present

## 2017-05-31 DIAGNOSIS — R51 Headache: Secondary | ICD-10-CM | POA: Diagnosis not present

## 2017-05-31 DIAGNOSIS — K449 Diaphragmatic hernia without obstruction or gangrene: Secondary | ICD-10-CM

## 2017-05-31 DIAGNOSIS — R519 Headache, unspecified: Secondary | ICD-10-CM

## 2017-05-31 DIAGNOSIS — Z79899 Other long term (current) drug therapy: Secondary | ICD-10-CM

## 2017-05-31 LAB — COMPREHENSIVE METABOLIC PANEL
ALT: 10 U/L (ref 0–35)
AST: 11 U/L (ref 0–37)
Albumin: 3.9 g/dL (ref 3.5–5.2)
Alkaline Phosphatase: 75 U/L (ref 39–117)
BUN: 15 mg/dL (ref 6–23)
CO2: 31 mEq/L (ref 19–32)
Calcium: 9.5 mg/dL (ref 8.4–10.5)
Chloride: 105 mEq/L (ref 96–112)
Creatinine, Ser: 0.92 mg/dL (ref 0.40–1.20)
GFR: 62.79 mL/min (ref >60.00)
Glucose, Bld: 95 mg/dL (ref 70–99)
Potassium: 4.8 mEq/L (ref 3.5–5.1)
Sodium: 141 mEq/L (ref 135–145)
Total Bilirubin: 0.5 mg/dL (ref 0.2–1.2)
Total Protein: 6.2 g/dL (ref 6.0–8.3)

## 2017-05-31 LAB — CBC WITH DIFFERENTIAL/PLATELET
Basophils Absolute: 0 10*3/uL (ref 0.0–0.1)
Basophils Relative: 1 % (ref 0.0–3.0)
Eosinophils Absolute: 0.1 10*3/uL (ref 0.0–0.7)
Eosinophils Relative: 1.4 % (ref 0.0–5.0)
HCT: 39.2 % (ref 36.0–46.0)
Hemoglobin: 12.8 g/dL (ref 12.0–15.0)
Lymphocytes Relative: 36 % (ref 12.0–46.0)
Lymphs Abs: 1.6 10*3/uL (ref 0.7–4.0)
MCHC: 32.7 g/dL (ref 30.0–36.0)
MCV: 89.3 fl (ref 78.0–100.0)
Monocytes Absolute: 0.3 10*3/uL (ref 0.1–1.0)
Monocytes Relative: 6.2 % (ref 3.0–12.0)
Neutro Abs: 2.4 10*3/uL (ref 1.4–7.7)
Neutrophils Relative %: 55.4 % (ref 43.0–77.0)
Platelets: 229 10*3/uL (ref 150.0–400.0)
RBC: 4.38 Mil/uL (ref 3.87–5.11)
RDW: 13.7 % (ref 11.5–15.5)
WBC: 4.3 10*3/uL (ref 4.0–10.5)

## 2017-05-31 LAB — VITAMIN D 25 HYDROXY (VIT D DEFICIENCY, FRACTURES): VITD: 22.54 ng/mL — ABNORMAL LOW (ref 30.00–100.00)

## 2017-05-31 LAB — MAGNESIUM: Magnesium: 2.3 mg/dL (ref 1.5–2.5)

## 2017-05-31 LAB — VITAMIN B12: Vitamin B-12: 445 pg/mL (ref 211–911)

## 2017-05-31 LAB — SEDIMENTATION RATE: Sed Rate: 6 mm/hr (ref 0–30)

## 2017-05-31 MED ORDER — BACLOFEN 5 MG PO TABS: 5.0000 mg | ORAL_TABLET | Freq: Three times a day (TID) | ORAL | 5 refills | Status: DC

## 2017-05-31 NOTE — Progress Notes (Signed)
Debra Sanchez , 11-Mar-1941, 77 y.o., female MRN: [id number] Patient Care Team    Relationship Specialty Notifications Start End  Ma Hillock, DO PCP - General Family Medicine  04/20/15     Chief Complaint  Patient presents with  . Headache    pt c/o heache X 12 months     Subjective: Pt presents for an OV with complaints of headache that occurs nightly for 12 months. She has an extensive and complicated history surrounding her hiatal hernia and multiple  nissen fundoplication procedures. Patient reports "a pocket with left he had black next the old food and liquid. "She had had extreme nausea and vomiting and reports she was thick for many weeks after both surgeries. Last surgery 06/17/2016. She has seen many specialists for this condition. Her main concern is that she has a bacterial load from her "pockets" that may be causing her persistent headaches over the last 12 months. She reports she's had a headache since the day of her original surgery. Headaches are worse at night and described as a tightness from the back of her scalp over to her forehead. She denies fever, chills, nausea, vomit, night sweats or unintentional weight loss. Patient has had thoracic spine surgery and anterior cervical discectomy and fusion. She endorses nighttime snoring and possible apneic spells.  Depression screen [facility] 2/9 01/16/2017 07/18/2016 04/20/2015  Decreased Interest 0 0 0  Down, Depressed, Hopeless 0 0 0  PHQ - 2 Score 0 0 0    Allergies  Allergen Reactions  . Hydromorphone Itching, Nausea Only and Other (See Comments)    After IV dilaudid pt flushed and felt dizzy  . Codeine Itching  . Latex Other (See Comments)    fainted  . Morphine And Related Itching  . Penicillins Itching and Nausea And Vomiting    Has patient had a PCN reaction causing immediate rash, facial/tongue/throat swelling, SOB or lightheadedness with hypotension: No Has patient had a PCN reaction causing severe rash  involving mucus membranes or skin necrosis: No Has patient had a PCN reaction that required hospitalization No Has patient had a PCN reaction occurring within the last 10 years: Yes If all of the above answers are "NO", then may proceed with Cephalosporin use.   . Sulfa Antibiotics Nausea And Vomiting   Social History   Tobacco Use  . Smoking status: Never Smoker  . Smokeless tobacco: Never Used  Substance Use Topics  . Alcohol use: No    Alcohol/week: 0.0 oz   Past Medical History:  Diagnosis Date  . Anemia   . Anxiety   . Arthritis   . Blood transfusion 2012  . Colon polyp   . DDD (degenerative disc disease), thoracolumbar   . Degenerative disc disease, lumbar   . Family history of adverse reaction to anesthesia    pts sister got very sick / burnt esophagus   . Gastric ulcer   . GERD (gastroesophageal reflux disease)   . Heart murmur   . History of hiatal hernia   . Hypertension   . Hypothyroidism   . Migraines   . Miscarriage   . Osteoporosis   . Pneumonia yrs ago  . Syncope 2012  . Wears glasses    Past Surgical History:  Procedure Laterality Date  . ABDOMINAL HYSTERECTOMY    . ANTERIOR CERVICAL DECOMP/DISCECTOMY FUSION     C5-6 and C6-7 anterior cervical diskectomy and fusion   . BILATERAL OOPHORECTOMY    . CATARACT EXTRACTION W/PHACO Left  10/20/2014   Procedure: CATARACT EXTRACTION PHACO AND INTRAOCULAR LENS PLACEMENT LEFT EYE;  Surgeon: Tonny Branch, MD;  Location: [facility];  Service: Ophthalmology;  Laterality: Left;  CDE:6.98  . CATARACT EXTRACTION W/PHACO Right 11/17/2014   Procedure: CATARACT EXTRACTION PHACO AND INTRAOCULAR LENS PLACEMENT RIGHT EYE CDE=6.57;  Surgeon: Tonny Branch, MD;  Location: [facility];  Service: Ophthalmology;  Laterality: Right;  . COLONOSCOPY  01/19/2011   Procedure: COLONOSCOPY;  Surgeon: Rogene Houston, MD;  Location: [facility];  Service: Endoscopy;  Laterality: N/A;  2:00  . DILATION AND CURETTAGE OF UTERUS    .  ESOPHAGOGASTRODUODENOSCOPY  11/05/2010   Procedure: ESOPHAGOGASTRODUODENOSCOPY (EGD);  Surgeon: Rogene Houston, MD;  Location: [facility];  Service: Endoscopy;  Laterality: N/A;  7:00 am per [facility]  . ESOPHAGOGASTRODUODENOSCOPY N/A 07/25/2012   Procedure: ESOPHAGOGASTRODUODENOSCOPY (EGD);  Surgeon: Rogene Houston, MD;  Location: [facility];  Service: Endoscopy;  Laterality: N/A;  400-moved to 82 Ann notified pt  . ESOPHAGOGASTRODUODENOSCOPY N/A 05/27/2015   Procedure: ESOPHAGOGASTRODUODENOSCOPY (EGD);  Surgeon: Rogene Houston, MD;  Location: [facility];  Service: Endoscopy;  Laterality: N/A;  2:00  . HIATAL HERNIA REPAIR N/A 08/25/2015   Procedure: LAPAROSCOPIC REPAIR OF LARGE TYPE III  HIATAL HERNIA;  Surgeon: Johnathan Hausen, MD;  Location: [facility];  Service: General;  Laterality: N/A;  . LAPAROSCOPIC NISSEN FUNDOPLICATION N/A 07/12/3534   Procedure: ENDOSCOPY, HIATAL HERNIA REPAIR, AND TAKE DOWN OF NISSEN FUNDOPILICATION;  Surgeon: Johnathan Hausen, MD;  Location: [facility];  Service: General;  Laterality: N/A;  . 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 Colon cancer Mother   . Heart disease Mother   . Arthritis Mother   . Heart disease Father   . Arthritis Father   . Arthritis Sister   . Arthritis Brother   . Arthritis Maternal Aunt   . Arthritis Maternal Uncle   . Diabetes Maternal Uncle    Allergies as of 05/31/2017      Reactions   Hydromorphone Itching, Nausea Only, Other (See Comments)   After IV dilaudid pt flushed and felt dizzy   Codeine Itching   Latex Other (See Comments)   fainted   Morphine And Related Itching   Penicillins Itching, Nausea And Vomiting   Has patient had a PCN reaction causing immediate rash, facial/tongue/throat swelling, SOB or lightheadedness with hypotension: No Has patient had a PCN reaction causing severe rash involving mucus membranes  or skin necrosis: No Has patient had a PCN reaction that required hospitalization No Has patient had a PCN reaction occurring within the last 10 years: Yes If all of the above answers are "NO", then may proceed with Cephalosporin use.   Sulfa Antibiotics Nausea And Vomiting      Medication List        Accurate as of 05/31/17 11:59 PM. Always use your most recent med list.          acetaminophen 325 MG tablet Commonly known as:  TYLENOL Take 325 mg by mouth every 6 (six) hours as needed for mild pain (For pain.).   amLODipine 2.5 MG tablet Commonly known as:  NORVASC Take 1 tablet (2.5 mg total) by mouth 2 (two) times daily.   Baclofen 5 MG Tabs Take 5 mg by mouth 3 (three) times daily.   esomeprazole 40 MG capsule Commonly known  as:  NEXIUM Take 1 capsule (40 mg total) by mouth 2 (two) times daily before a meal.   levothyroxine 50 MCG tablet Commonly known as:  SYNTHROID, LEVOTHROID Take 1 tablet (50 mcg total) by mouth daily.   losartan 100 MG tablet Commonly known as:  COZAAR TAKE TABLET BY MOUTH ONCE  DAILY.   Melatonin 5 MG Tabs Take 5 mg by mouth at bedtime.   multivitamin Liqd Take 5 mLs by mouth daily after lunch.   sertraline 100 MG tablet Commonly known as:  ZOLOFT Take 1 tablet (100 mg total) by mouth at bedtime.   sucralfate 1 GM/10ML suspension Commonly known as:  CARAFATE Take 10 mLs (1 g total) 4 (four) times daily by mouth.   SUMAtriptan 100 MG tablet Commonly known as:  IMITREX TAKE 1 TABLET BY MOUTH AS  NEEDED FOR MIGRAINE. MAY  REPEAT IN 2 HOURS IF  HEADACHE PERSISTS OR  RECURS.      Results for orders placed or performed in visit on 05/31/17 (from the past 48 hour(s))  CBC w/Diff     Status: None   Collection Time: 05/31/17 10:39 AM  Result Value Ref Range   WBC 4.3 4.0 - 10.5 K/uL   RBC 4.38 3.87 - 5.11 Mil/uL   Hemoglobin 12.8 12.0 - 15.0 g/dL   HCT 39.2 36.0 - 46.0 %   MCV 89.3 78.0 - 100.0 fl   MCHC 32.7 30.0 - 36.0 g/dL   RDW  13.7 11.5 - 15.5 %   Platelets 229.0 150.0 - 400.0 K/uL   Neutrophils Relative % 55.4 43.0 - 77.0 %   Lymphocytes Relative 36.0 12.0 - 46.0 %   Monocytes Relative 6.2 3.0 - 12.0 %   Eosinophils Relative 1.4 0.0 - 5.0 %   Basophils Relative 1.0 0.0 - 3.0 %   Neutro Abs 2.4 1.4 - 7.7 K/uL   Lymphs Abs 1.6 0.7 - 4.0 K/uL   Monocytes Absolute 0.3 0.1 - 1.0 K/uL   Eosinophils Absolute 0.1 0.0 - 0.7 K/uL   Basophils Absolute 0.0 0.0 - 0.1 K/uL  B12     Status: None   Collection Time: 05/31/17 10:39 AM  Result Value Ref Range   Vitamin B-12 445 211 - 911 pg/mL  VITAMIN D 25 Hydroxy (Vit-D Deficiency, Fractures)     Status: Abnormal   Collection Time: 05/31/17 10:39 AM  Result Value Ref Range   VITD 22.54 (L) 30.00 - 100.00 ng/mL  Sedimentation rate     Status: None   Collection Time: 05/31/17 10:39 AM  Result Value Ref Range   Sed Rate 6 0 - 30 mm/hr  Magnesium     Status: None   Collection Time: 05/31/17 10:39 AM  Result Value Ref Range   Magnesium 2.3 1.5 - 2.5 mg/dL  Comp Met (CMET)     Status: None   Collection Time: 05/31/17 10:39 AM  Result Value Ref Range   Sodium 141 135 - 145 mEq/L   Potassium 4.8 3.5 - 5.1 mEq/L   Chloride 105 96 - 112 mEq/L   CO2 31 19 - 32 mEq/L   Glucose, Bld 95 70 - 99 mg/dL   BUN 15 6 - 23 mg/dL   Creatinine, Ser 0.92 0.40 - 1.20 mg/dL   Total Bilirubin 0.5 0.2 - 1.2 mg/dL   Alkaline Phosphatase 75 39 - 117 U/L   AST 11 0 - 37 U/L   ALT 10 0 - 35 U/L   Total Protein 6.2 6.0 -  8.3 g/dL   Albumin 3.9 3.5 - 5.2 g/dL   Calcium 9.5 8.4 - 10.5 mg/dL   GFR 62.79 >60.00 mL/min    All past medical history, surgical history, allergies, family history, immunizations andmedications were updated in the EMR today and reviewed under the history and medication portions of their EMR.     ROS: Negative, with the exception of above mentioned in HPI   Objective:  BP 113/76 (BP Location: Right Arm, Patient Position: Sitting, Cuff Size: Normal)   Pulse 76    Temp 98.1 F (36.7 C) (Oral)   Ht 5' 8"  (1.727 m)   Wt 197 lb 12.8 oz (89.7 kg)   SpO2 97%   BMI 30.08 kg/m  Body mass index is 30.08 kg/m. Gen: Afebrile. No acute distress. Nontoxic in appearance, well developed, well nourished.  HENT: AT. [place].  MMM, no oral lesions.  Eyes:Pupils Equal Round Reactive to light, Extraocular movements intact,  Conjunctiva without redness, discharge or icterus. Neck/lymp/endocrine: Supple, no lymphadenopathy CV: RRR  Chest: CTAB, no wheeze or crackles. Good air movement, normal resp effort.  Abd: Soft. NTND. BS present.  Skin: no rashes, purpura or petechiae.  Neuro: Normal gait. PERLA. EOMi. Alert. Oriented x3 Cranial nerves II through XII intact. Muscle strength 5/5 bilateral upper/lower extremity. DTRs equal bilaterally. Psych: Normal affect, dress and demeanor. Normal speech. Normal thought content and judgment.   Assessment/Plan: HOLLIE WOJAHN is a 77 y.o. female present for OV for  Persistent headaches Hiatal hernia with GERD/Encounter for monitoring proton pump inhibitor therapy - Discussed with patient there are multiple possible etiologies to her nighttime headaches. Review of her medication list completed and she is on melatonin which she admits she started about one year ago. Last her a side effect to melatonin is headaches, I would suggest she stop use of melatonin first and see if headaches resolved. - Headaches do have a tension-like quality that could be secondary to stress, tension headache. If headaches not improved with stopping melatonin, start baclofen prescription provided today. - Patient with fundoplication and long-term use of PPI, therefore will check B-12, vitamin D and magnesium. As well as CMP and CBC to rule out any electrolytes or reassure her no infectious cause. - CBC w/Diff - B12 - VITAMIN D 25 Hydroxy (Vit-D Deficiency, Fractures) - Sedimentation rate - Magnesium - Comp Met (CMET) - baclofen 5 MG TABS; Take 5 mg  by mouth 3 (three) times daily.  Dispense: 90 tablet; Refill: 5 - Follow-up 4 weeks  > 25 minutes spent with patient, >50% of time spent face to face counseling and/or coordinating care.     Reviewed expectations re: course of current medical issues.  Discussed self-management of symptoms.  Outlined signs and symptoms indicating need for more acute intervention.  Patient verbalized understanding and all questions were answered.  Patient received an After-Visit Summary.    Orders Placed This Encounter  Procedures  . CBC w/Diff  . B12  . VITAMIN D 25 Hydroxy (Vit-D Deficiency, Fractures)  . Sedimentation rate  . Magnesium  . Comp Met (CMET)     Note is dictated utilizing voice recognition software. Although note has been proof read prior to signing, occasional typographical errors still can be missed. If any questions arise, please do not hesitate to call for verification.   electronically signed by:  Howard Pouch, DO  [place]

## 2017-05-31 NOTE — Patient Instructions (Signed)
Stop melatonin for 1 week. If headache does not improve then start the baclofen every 12 hours for 1 week, then every 8 hours after 1 week.  I will call you with lab results once available and discuss any potential supplement that may be needed.   Also try massage and heat therapy to your neck/shoulders.   Follow up will be discussed after al labs results available.

## 2017-06-01 ENCOUNTER — Telehealth: Payer: Self-pay | Admitting: Family Medicine

## 2017-06-01 NOTE — Telephone Encounter (Signed)
Please call patient: Her labs are normal with the following exception of low vitamin D (22).   - I have called in a Vit D once weekly supplement, take with food. If she is not taking a daily Vit d, she should also start 800-1000u/d and continue after prescribed has been completed. - Her b12 is in the low end normal range. I would recommend she take a daily vitamin b12 supplement. If she could find be sublingual (placed under the tongue) solution, this would be best for her absorption given her stomach condition. Follow-up in 4 weeks

## 2017-06-01 NOTE — Telephone Encounter (Signed)
Spoke with patient reviewed lab results and instructions. Patient verbalized understanding. Patient states she didn't take melatonin last night and she didn't have a headache so she thinks this was the cause of her headaches .

## 2017-06-07 ENCOUNTER — Encounter (INDEPENDENT_AMBULATORY_CARE_PROVIDER_SITE_OTHER): Payer: Self-pay | Admitting: *Deleted

## 2017-06-08 ENCOUNTER — Telehealth (INDEPENDENT_AMBULATORY_CARE_PROVIDER_SITE_OTHER): Payer: Self-pay | Admitting: *Deleted

## 2017-06-08 NOTE — Telephone Encounter (Signed)
Patient called to follow up on records.

## 2017-06-09 NOTE — Telephone Encounter (Signed)
Spoke to patient, advised we haven't received records, she gave me number to medical records @ [facility] and I will give them a call

## 2017-06-19 ENCOUNTER — Other Ambulatory Visit: Payer: Self-pay | Admitting: Family Medicine

## 2017-06-19 DIAGNOSIS — I10 Essential (primary) hypertension: Secondary | ICD-10-CM

## 2017-06-19 DIAGNOSIS — F419 Anxiety disorder, unspecified: Secondary | ICD-10-CM

## 2017-06-22 ENCOUNTER — Other Ambulatory Visit: Payer: Self-pay | Admitting: Family Medicine

## 2017-06-26 ENCOUNTER — Other Ambulatory Visit (INDEPENDENT_AMBULATORY_CARE_PROVIDER_SITE_OTHER): Payer: Self-pay | Admitting: *Deleted

## 2017-06-26 MED ORDER — ESOMEPRAZOLE MAGNESIUM 40 MG PO CPDR: 40.0000 mg | DELAYED_RELEASE_CAPSULE | Freq: Two times a day (BID) | ORAL | 3 refills | Status: DC

## 2017-07-03 ENCOUNTER — Telehealth (INDEPENDENT_AMBULATORY_CARE_PROVIDER_SITE_OTHER): Payer: Self-pay | Admitting: *Deleted

## 2017-07-03 ENCOUNTER — Telehealth (INDEPENDENT_AMBULATORY_CARE_PROVIDER_SITE_OTHER): Payer: Self-pay | Admitting: Internal Medicine

## 2017-07-03 NOTE — Telephone Encounter (Signed)
Patient called leaving a message for Dr.Rehman to call her as she feels that she is having a reaction to the medication that he gave to her.  Patient was called at the number she provided , [phone] , a voicemail was left asking that she call the office back so that I could get information from her to address with Dr.Rehman.

## 2017-07-03 NOTE — Telephone Encounter (Signed)
Patient called in reference to the medication Nexium.  Patient stated it is making her dizzy and she has stopped taking. Please call back at [phone]

## 2017-07-06 DIAGNOSIS — H5310 Unspecified subjective visual disturbances: Secondary | ICD-10-CM | POA: Diagnosis not present

## 2017-07-06 NOTE — Telephone Encounter (Signed)
Patient returned the call and states that the Nexium made her dizzy so she had stopped the medication. Dr.Rehman was made aware and he ask that the patient review what she has not taken and let us know. She may be able to try one of those . This message was left on the patients voicemail.

## 2017-07-11 ENCOUNTER — Other Ambulatory Visit: Payer: Self-pay | Admitting: Family Medicine

## 2017-07-11 DIAGNOSIS — N390 Urinary tract infection, site not specified: Secondary | ICD-10-CM | POA: Diagnosis not present

## 2017-07-11 DIAGNOSIS — R319 Hematuria, unspecified: Secondary | ICD-10-CM | POA: Diagnosis not present

## 2017-07-11 DIAGNOSIS — R300 Dysuria: Secondary | ICD-10-CM | POA: Diagnosis not present

## 2017-07-26 ENCOUNTER — Telehealth (INDEPENDENT_AMBULATORY_CARE_PROVIDER_SITE_OTHER): Payer: Self-pay | Admitting: Internal Medicine

## 2017-07-26 NOTE — Telephone Encounter (Signed)
I will address this with Dr.Rehman. 

## 2017-07-26 NOTE — Telephone Encounter (Signed)
Patient called wants to be put on something other than Nexium

## 2017-07-27 NOTE — Telephone Encounter (Signed)
Patient has been on Dexilant,Omeprazole , Pantoprazole. Dr.Rehman what is your recommendation?

## 2017-08-07 DIAGNOSIS — H04123 Dry eye syndrome of bilateral lacrimal glands: Secondary | ICD-10-CM | POA: Diagnosis not present

## 2017-08-07 DIAGNOSIS — H35371 Puckering of macula, right eye: Secondary | ICD-10-CM | POA: Diagnosis not present

## 2017-08-07 DIAGNOSIS — Z961 Presence of intraocular lens: Secondary | ICD-10-CM | POA: Diagnosis not present

## 2017-08-08 ENCOUNTER — Telehealth (INDEPENDENT_AMBULATORY_CARE_PROVIDER_SITE_OTHER): Payer: Self-pay | Admitting: Internal Medicine

## 2017-08-08 NOTE — Telephone Encounter (Signed)
Patient called stated she could not take nexium - would like to be put back on what she was taking before if Dr Laural Golden could call that in for her.

## 2017-08-10 NOTE — Telephone Encounter (Signed)
Patient was called and a message was left on her voicemail asking which PPI she was referring too. It is noted that she has been on Dexilant 60 mg , Omeprazole 20 mg , and Pantoprazole 40 mg. We were checking to make sure , so that we may call in the correct medication for Debra Sanchez. We ask that she call our office back with this information.

## 2017-08-11 ENCOUNTER — Other Ambulatory Visit (INDEPENDENT_AMBULATORY_CARE_PROVIDER_SITE_OTHER): Payer: Self-pay | Admitting: *Deleted

## 2017-08-11 MED ORDER — PANTOPRAZOLE SODIUM 40 MG PO TBEC: 40.0000 mg | DELAYED_RELEASE_TABLET | Freq: Two times a day (BID) | ORAL | 3 refills | Status: DC

## 2017-08-11 NOTE — Telephone Encounter (Signed)
Patient called the office this morning and she is requesting that we sent in the Pantoprazole 40 mg. She will be taking twice a day. A 90 day supply. Per Dr.Rehman this will be okay. This was sent to patient's mail order pharmacy. Patient is aware.

## 2017-08-29 ENCOUNTER — Other Ambulatory Visit: Payer: Self-pay | Admitting: Family Medicine

## 2017-08-29 DIAGNOSIS — F419 Anxiety disorder, unspecified: Secondary | ICD-10-CM

## 2017-08-29 DIAGNOSIS — I10 Essential (primary) hypertension: Secondary | ICD-10-CM

## 2017-09-29 DIAGNOSIS — J40 Bronchitis, not specified as acute or chronic: Secondary | ICD-10-CM | POA: Diagnosis not present

## 2017-10-10 ENCOUNTER — Ambulatory Visit (INDEPENDENT_AMBULATORY_CARE_PROVIDER_SITE_OTHER): Payer: Medicare Other | Admitting: Internal Medicine

## 2017-10-17 ENCOUNTER — Encounter (INDEPENDENT_AMBULATORY_CARE_PROVIDER_SITE_OTHER): Payer: Self-pay | Admitting: Internal Medicine

## 2017-10-17 ENCOUNTER — Ambulatory Visit (INDEPENDENT_AMBULATORY_CARE_PROVIDER_SITE_OTHER): Payer: Medicare Other | Admitting: Internal Medicine

## 2017-10-17 VITALS — BP 112/70 | Ht 68.0 in | Wt 196.1 lb

## 2017-10-17 DIAGNOSIS — K2100 Gastro-esophageal reflux disease with esophagitis, without bleeding: Secondary | ICD-10-CM

## 2017-10-17 DIAGNOSIS — K449 Diaphragmatic hernia without obstruction or gangrene: Secondary | ICD-10-CM

## 2017-10-17 NOTE — Progress Notes (Signed)
Presenting complaint;  Follow-up for chronic GERD.  Database and subjective:  Patient is 77 year old Caucasian female who has chronic GERD and failed antireflux surgery twice who now has moderate sized sliding hiatal hernia is here for scheduled visit.  Last year she was evaluated at [facility] and surgery was considered but she decided not to have surgery. She tried Nexium but could not tolerate it. She is now on pantoprazole twice daily but she does not take it before meals.  She has intermittent heartburn and regurgitation.  She is watching her diet very closely.  She has difficulty with fatty and fried foods as well as with spicy foods.  She  also has severe heartburn with tomato-based foods and she stays away from these.  She reports recent episode of dysphagia while she was eating cherries.  She eats her supper between 5 and 6:00 and goes to bed at 10 PM.  She has had end of the bed elevated by 6 inches and she uses 2 pillows.  She may have a regurgitation couple times a month.  She also has some heartburn between 1 and 2 AM about once a week.  She denies nausea hematemesis or melena.   Current Medications: Outpatient Encounter Medications as of 10/17/2017  Medication Sig  . amLODipine (NORVASC) 2.5 MG tablet TAKE 1 TABLET BY MOUTH TWO  TIMES DAILY  . levothyroxine (SYNTHROID, LEVOTHROID) 50 MCG tablet Take 1 tablet (50 mcg total) by mouth daily.  Marland Kitchen losartan (COZAAR) 100 MG tablet TAKE 1 TABLET BY MOUTH  DAILY  . Melatonin 5 MG TABS Take 5 mg by mouth at bedtime.  . pantoprazole (PROTONIX) 40 MG tablet Take 1 tablet (40 mg total) by mouth 2 (two) times daily.  . sertraline (ZOLOFT) 100 MG tablet TAKE 1 TABLET BY MOUTH AT  BEDTIME  . SUMAtriptan (IMITREX) 100 MG tablet TAKE 1 TABLET BY MOUTH AS  NEEDED FOR MIGRAINE. MAY  REPEAT IN 2 HOURS IF  HEADACHE PERSISTS OR  RECURS.  Marland Kitchen acetaminophen (TYLENOL) 325 MG tablet Take 325 mg by mouth every 6 (six) hours as needed for mild pain (For  pain.).   Marland Kitchen esomeprazole (NEXIUM) 40 MG capsule Take 1 capsule (40 mg total) by mouth 2 (two) times daily before a meal. (Patient not taking: Reported on 10/17/2017)  . Multiple Vitamin (MULTIVITAMIN) LIQD Take 5 mLs by mouth daily after lunch.  . sucralfate (CARAFATE) 1 GM/10ML suspension Take 10 mLs (1 g total) 4 (four) times daily by mouth. (Patient not taking: Reported on 10/17/2017)  . [DISCONTINUED] baclofen 5 MG TABS Take 5 mg by mouth 3 (three) times daily.   No facility-administered encounter medications on file as of 10/17/2017.    Patient is alert and in no acute distress.  Objective: Blood pressure 112/70, height 5\' 8"  (1.727 m), weight 196 lb 1.6 oz (89 kg). Patient is alert and in no acute distress. Conjunctiva is pink. Sclera is nonicteric Oropharyngeal mucosa is normal. No neck masses or thyromegaly noted. Cardiac exam with regular rhythm normal S1 and S2. No murmur or gallop noted. Lungs are clear to auscultation. Abdomen is full.  Its soft with mild midepigastric tenderness.  No organomegaly or masses. No LE edema or clubbing noted.  Labs/studies Results:  Upper GI images from 08/18/2016 study reviewed with patient.  Assessment:  #1.  Chronic GERD.  She failed antireflux surgery.  She had second surgery and wrap was undone.  Was evaluated at [facility] but decided not to have  third surgery.  She is on dietary measures and double dose PPI and still having frequent symptoms.  She may benefit from low-dose promotility agent.  #2.  Solid food dysphagia.  She did not have esophageal stricture on barium study of 14 months ago.  If dysphagia becomes more frequent will consider EGD with dilation.   Plan:  Patient advised to take pantoprazole 30 minutes before breakfast and 30 minutes before evening meal. Patient will keep symptom diary as to frequency of regurgitation/vomiting spells. She will try taking 1 to 2 tablets of Tums at bedtime for a week or 2 and see if it  helps. Domperidone 10 mg once or twice daily [place] Office visit in 3 months.

## 2017-10-17 NOTE — Patient Instructions (Addendum)
Keep symptom diary as to frequency of vomiting spells and regurgitation. Please remember to take pantoprazole 30 minutes before breakfast and evening meal. Can take Tums 1 to 2 tablets at bedtime daily for a few days and thereafter on as-needed basis. Call office when you are able to secure domperidone.

## 2017-10-23 ENCOUNTER — Other Ambulatory Visit: Payer: Self-pay | Admitting: Family Medicine

## 2017-10-24 MED ORDER — SUMATRIPTAN SUCCINATE 100 MG PO TABS: ORAL_TABLET | ORAL | 1 refills | Status: DC

## 2017-10-24 NOTE — Telephone Encounter (Signed)
Please call pt and verify she is requesting scripts. She should have enough thyroid med to last her until next appt which should occur end of September (do not see it scheduled though) This is when she is due for recheck on thyroid levels (yearly). If she is asking for the Imitrex and it is not generated by the pharmacy alone, We call in a refill for her.

## 2017-10-24 NOTE — Telephone Encounter (Signed)
Patient states that she is out of Imitrex. Refill sent to pharmacy.

## 2017-12-11 ENCOUNTER — Other Ambulatory Visit: Payer: Self-pay | Admitting: Family Medicine

## 2017-12-11 DIAGNOSIS — F419 Anxiety disorder, unspecified: Secondary | ICD-10-CM

## 2017-12-11 DIAGNOSIS — I10 Essential (primary) hypertension: Secondary | ICD-10-CM

## 2018-01-09 ENCOUNTER — Ambulatory Visit (INDEPENDENT_AMBULATORY_CARE_PROVIDER_SITE_OTHER): Payer: Medicare Other | Admitting: Internal Medicine

## 2018-01-09 ENCOUNTER — Other Ambulatory Visit: Payer: Self-pay | Admitting: Family Medicine

## 2018-01-09 DIAGNOSIS — F419 Anxiety disorder, unspecified: Secondary | ICD-10-CM

## 2018-01-09 DIAGNOSIS — I10 Essential (primary) hypertension: Secondary | ICD-10-CM

## 2018-01-23 ENCOUNTER — Ambulatory Visit (INDEPENDENT_AMBULATORY_CARE_PROVIDER_SITE_OTHER): Payer: Medicare Other | Admitting: Internal Medicine

## 2018-01-29 ENCOUNTER — Encounter: Payer: Self-pay | Admitting: Family Medicine

## 2018-01-29 ENCOUNTER — Ambulatory Visit (INDEPENDENT_AMBULATORY_CARE_PROVIDER_SITE_OTHER): Payer: Medicare Other | Admitting: Family Medicine

## 2018-01-29 ENCOUNTER — Other Ambulatory Visit: Payer: Self-pay | Admitting: Family Medicine

## 2018-01-29 VITALS — BP 115/68 | HR 70 | Temp 98.6°F | Resp 20 | Ht 68.0 in | Wt 195.0 lb

## 2018-01-29 DIAGNOSIS — Z131 Encounter for screening for diabetes mellitus: Secondary | ICD-10-CM | POA: Diagnosis not present

## 2018-01-29 DIAGNOSIS — Z0000 Encounter for general adult medical examination without abnormal findings: Secondary | ICD-10-CM

## 2018-01-29 DIAGNOSIS — M25561 Pain in right knee: Secondary | ICD-10-CM | POA: Diagnosis not present

## 2018-01-29 DIAGNOSIS — M25562 Pain in left knee: Secondary | ICD-10-CM

## 2018-01-29 DIAGNOSIS — F419 Anxiety disorder, unspecified: Secondary | ICD-10-CM | POA: Diagnosis not present

## 2018-01-29 DIAGNOSIS — G8929 Other chronic pain: Secondary | ICD-10-CM | POA: Diagnosis not present

## 2018-01-29 DIAGNOSIS — E039 Hypothyroidism, unspecified: Secondary | ICD-10-CM

## 2018-01-29 DIAGNOSIS — I10 Essential (primary) hypertension: Secondary | ICD-10-CM | POA: Diagnosis not present

## 2018-01-29 DIAGNOSIS — E559 Vitamin D deficiency, unspecified: Secondary | ICD-10-CM | POA: Diagnosis not present

## 2018-01-29 DIAGNOSIS — Z23 Encounter for immunization: Secondary | ICD-10-CM

## 2018-01-29 DIAGNOSIS — D509 Iron deficiency anemia, unspecified: Secondary | ICD-10-CM | POA: Diagnosis not present

## 2018-01-29 LAB — COMPREHENSIVE METABOLIC PANEL
ALT: 9 U/L (ref 0–35)
AST: 11 U/L (ref 0–37)
Albumin: 4 g/dL (ref 3.5–5.2)
Alkaline Phosphatase: 72 U/L (ref 39–117)
BUN: 14 mg/dL (ref 6–23)
CO2: 31 mEq/L (ref 19–32)
Calcium: 9.4 mg/dL (ref 8.4–10.5)
Chloride: 103 mEq/L (ref 96–112)
Creatinine, Ser: 0.94 mg/dL (ref 0.40–1.20)
GFR: 61.15 mL/min (ref >60.00)
Glucose, Bld: 91 mg/dL (ref 70–99)
Potassium: 4 mEq/L (ref 3.5–5.1)
Sodium: 140 mEq/L (ref 135–145)
Total Bilirubin: 0.5 mg/dL (ref 0.2–1.2)
Total Protein: 6.2 g/dL (ref 6.0–8.3)

## 2018-01-29 LAB — CBC WITH DIFFERENTIAL/PLATELET
Basophils Absolute: 0 10*3/uL (ref 0.0–0.1)
Basophils Relative: 0.5 % (ref 0.0–3.0)
Eosinophils Absolute: 0.1 10*3/uL (ref 0.0–0.7)
Eosinophils Relative: 1.4 % (ref 0.0–5.0)
HCT: 36.7 % (ref 36.0–46.0)
Hemoglobin: 12.2 g/dL (ref 12.0–15.0)
Lymphocytes Relative: 39 % (ref 12.0–46.0)
Lymphs Abs: 1.8 10*3/uL (ref 0.7–4.0)
MCHC: 33.3 g/dL (ref 30.0–36.0)
MCV: 88.8 fl (ref 78.0–100.0)
Monocytes Absolute: 0.3 10*3/uL (ref 0.1–1.0)
Monocytes Relative: 6.7 % (ref 3.0–12.0)
Neutro Abs: 2.4 10*3/uL (ref 1.4–7.7)
Neutrophils Relative %: 52.4 % (ref 43.0–77.0)
Platelets: 220 10*3/uL (ref 150.0–400.0)
RBC: 4.14 Mil/uL (ref 3.87–5.11)
RDW: 13.6 % (ref 11.5–15.5)
WBC: 4.6 10*3/uL (ref 4.0–10.5)

## 2018-01-29 LAB — LIPID PANEL
Cholesterol: 212 mg/dL — ABNORMAL HIGH (ref 0–200)
HDL: 46.6 mg/dL (ref >39.00)
LDL Cholesterol: 138 mg/dL — ABNORMAL HIGH (ref 0–99)
NonHDL: 165.42
Total CHOL/HDL Ratio: 5
Triglycerides: 137 mg/dL (ref 0.0–149.0)
VLDL: 27.4 mg/dL (ref 0.0–40.0)

## 2018-01-29 LAB — HEMOGLOBIN A1C: Hgb A1c MFr Bld: 5.6 % (ref 4.6–6.5)

## 2018-01-29 LAB — VITAMIN D 25 HYDROXY (VIT D DEFICIENCY, FRACTURES): VITD: 22.02 ng/mL — ABNORMAL LOW (ref 30.00–100.00)

## 2018-01-29 LAB — TSH: TSH: 2.53 u[IU]/mL (ref 0.35–4.50)

## 2018-01-29 MED ORDER — AMLODIPINE BESYLATE 2.5 MG PO TABS: 2.5000 mg | ORAL_TABLET | Freq: Two times a day (BID) | ORAL | 1 refills | Status: DC

## 2018-01-29 MED ORDER — ZOSTER VAC RECOMB ADJUVANTED 50 MCG/0.5ML IM SUSR: 0.5000 mL | Freq: Once | INTRAMUSCULAR | 1 refills | Status: AC

## 2018-01-29 MED ORDER — SERTRALINE HCL 100 MG PO TABS: 100.0000 mg | ORAL_TABLET | Freq: Every day | ORAL | 1 refills | Status: DC

## 2018-01-29 MED ORDER — LOSARTAN POTASSIUM 100 MG PO TABS: 100.0000 mg | ORAL_TABLET | Freq: Every day | ORAL | 1 refills | Status: DC

## 2018-01-29 NOTE — Patient Instructions (Signed)
Health Maintenance, Female Adopting a healthy lifestyle and getting preventive care can go a long way to promote health and wellness. Talk with your health care provider about what schedule of regular examinations is right for you. This is a good chance for you to check in with your provider about disease prevention and staying healthy. In between checkups, there are plenty of things you can do on your own. Experts have done a lot of research about which lifestyle changes and preventive measures are most likely to keep you healthy. Ask your health care provider for more information. Weight and diet Eat a healthy diet  Be sure to include plenty of vegetables, fruits, low-fat dairy products, and lean protein.  Do not eat a lot of foods high in solid fats, added sugars, or salt.  Get regular exercise. This is one of the most important things you can do for your health. ? Most adults should exercise for at least 150 minutes each week. The exercise should increase your heart rate and make you sweat (moderate-intensity exercise). ? Most adults should also do strengthening exercises at least twice a week. This is in addition to the moderate-intensity exercise.  Maintain a healthy weight  Body mass index (BMI) is a measurement that can be used to identify possible weight problems. It estimates body fat based on height and weight. Your health care provider can help determine your BMI and help you achieve or maintain a healthy weight.  For females 20 years of age and older: ? A BMI below 18.5 is considered underweight. ? A BMI of 18.5 to 24.9 is normal. ? A BMI of 25 to 29.9 is considered overweight. ? A BMI of 30 and above is considered obese.  Watch levels of cholesterol and blood lipids  You should start having your blood tested for lipids and cholesterol at 78 years of age, then have this test every 5 years.  You may need to have your cholesterol levels checked more often if: ? Your lipid or  cholesterol levels are high. ? You are older than 78 years of age. ? You are at high risk for heart disease.  Cancer screening Lung Cancer  Lung cancer screening is recommended for adults 55-80 years old who are at high risk for lung cancer because of a history of smoking.  A yearly low-dose CT scan of the lungs is recommended for people who: ? Currently smoke. ? Have quit within the past 15 years. ? Have at least a 30-pack-year history of smoking. A pack year is smoking an average of one pack of cigarettes a day for 1 year.  Yearly screening should continue until it has been 15 years since you quit.  Yearly screening should stop if you develop a health problem that would prevent you from having lung cancer treatment.  Breast Cancer  Practice breast self-awareness. This means understanding how your breasts normally appear and feel.  It also means doing regular breast self-exams. Let your health care provider know about any changes, no matter how small.  If you are in your 20s or 30s, you should have a clinical breast exam (CBE) by a health care provider every 1-3 years as part of a regular health exam.  If you are 40 or older, have a CBE every year. Also consider having a breast X-ray (mammogram) every year.  If you have a family history of breast cancer, talk to your health care provider about genetic screening.  If you are at high risk   for breast cancer, talk to your health care provider about having an MRI and a mammogram every year.  Breast cancer gene (BRCA) assessment is recommended for women who have family members with BRCA-related cancers. BRCA-related cancers include: ? Breast. ? Ovarian. ? Tubal. ? Peritoneal cancers.  Results of the assessment will determine the need for genetic counseling and BRCA1 and BRCA2 testing.  Cervical Cancer Your health care provider may recommend that you be screened regularly for cancer of the pelvic organs (ovaries, uterus, and  vagina). This screening involves a pelvic examination, including checking for microscopic changes to the surface of your cervix (Pap test). You may be encouraged to have this screening done every 3 years, beginning at age 22.  For women ages 56-65, health care providers may recommend pelvic exams and Pap testing every 3 years, or they may recommend the Pap and pelvic exam, combined with testing for human papilloma virus (HPV), every 5 years. Some types of HPV increase your risk of cervical cancer. Testing for HPV may also be done on women of any age with unclear Pap test results.  Other health care providers may not recommend any screening for nonpregnant women who are considered low risk for pelvic cancer and who do not have symptoms. Ask your health care provider if a screening pelvic exam is right for you.  If you have had past treatment for cervical cancer or a condition that could lead to cancer, you need Pap tests and screening for cancer for at least 20 years after your treatment. If Pap tests have been discontinued, your risk factors (such as having a new sexual partner) need to be reassessed to determine if screening should resume. Some women have medical problems that increase the chance of getting cervical cancer. In these cases, your health care provider may recommend more frequent screening and Pap tests.  Colorectal Cancer  This type of cancer can be detected and often prevented.  Routine colorectal cancer screening usually begins at 78 years of age and continues through 78 years of age.  Your health care provider may recommend screening at an earlier age if you have risk factors for colon cancer.  Your health care provider may also recommend using home test kits to check for hidden blood in the stool.  A small camera at the end of a tube can be used to examine your colon directly (sigmoidoscopy or colonoscopy). This is done to check for the earliest forms of colorectal  cancer.  Routine screening usually begins at age 33.  Direct examination of the colon should be repeated every 5-10 years through 78 years of age. However, you may need to be screened more often if early forms of precancerous polyps or small growths are found.  Skin Cancer  Check your skin from head to toe regularly.  Tell your health care provider about any new moles or changes in moles, especially if there is a change in a mole's shape or color.  Also tell your health care provider if you have a mole that is larger than the size of a pencil eraser.  Always use sunscreen. Apply sunscreen liberally and repeatedly throughout the day.  Protect yourself by wearing long sleeves, pants, a wide-brimmed hat, and sunglasses whenever you are outside.  Heart disease, diabetes, and high blood pressure  High blood pressure causes heart disease and increases the risk of stroke. High blood pressure is more likely to develop in: ? People who have blood pressure in the high end of  the normal range (130-139/85-89 mm Hg). ? People who are overweight or obese. ? People who are African American.  If you are 21-29 years of age, have your blood pressure checked every 3-5 years. If you are 3 years of age or older, have your blood pressure checked every year. You should have your blood pressure measured twice-once when you are at a hospital or clinic, and once when you are not at a hospital or clinic. Record the average of the two measurements. To check your blood pressure when you are not at a hospital or clinic, you can use: ? An automated blood pressure machine at a pharmacy. ? A home blood pressure monitor.  If you are between 17 years and 37 years old, ask your health care provider if you should take aspirin to prevent strokes.  Have regular diabetes screenings. This involves taking a blood sample to check your fasting blood sugar level. ? If you are at a normal weight and have a low risk for diabetes,  have this test once every three years after 78 years of age. ? If you are overweight and have a high risk for diabetes, consider being tested at a younger age or more often. Preventing infection Hepatitis B  If you have a higher risk for hepatitis B, you should be screened for this virus. You are considered at high risk for hepatitis B if: ? You were born in a country where hepatitis B is common. Ask your health care provider which countries are considered high risk. ? Your parents were born in a high-risk country, and you have not been immunized against hepatitis B (hepatitis B vaccine). ? You have HIV or AIDS. ? You use needles to inject street drugs. ? You live with someone who has hepatitis B. ? You have had sex with someone who has hepatitis B. ? You get hemodialysis treatment. ? You take certain medicines for conditions, including cancer, organ transplantation, and autoimmune conditions.  Hepatitis C  Blood testing is recommended for: ? Everyone born from [birth year] through 1966. ? Anyone with known risk factors for hepatitis C.  Sexually transmitted infections (STIs)  You should be screened for sexually transmitted infections (STIs) including gonorrhea and chlamydia if: ? You are sexually active and are younger than 78 years of age. ? You are older than 78 years of age and your health care provider tells you that you are at risk for this type of infection. ? Your sexual activity has changed since you were last screened and you are at an increased risk for chlamydia or gonorrhea. Ask your health care provider if you are at risk.  If you do not have HIV, but are at risk, it may be recommended that you take a prescription medicine daily to prevent HIV infection. This is called pre-exposure prophylaxis (PrEP). You are considered at risk if: ? You are sexually active and do not regularly use condoms or know the HIV status of your partner(s). ? You take drugs by injection. ? You are  sexually active with a partner who has HIV.  Talk with your health care provider about whether you are at high risk of being infected with HIV. If you choose to begin PrEP, you should first be tested for HIV. You should then be tested every 3 months for as long as you are taking PrEP. Pregnancy  If you are premenopausal and you may become pregnant, ask your health care provider about preconception counseling.  If you may become  pregnant, take 400 to 800 micrograms (mcg) of folic acid every day.  If you want to prevent pregnancy, talk to your health care provider about birth control (contraception). Osteoporosis and menopause  Osteoporosis is a disease in which the bones lose minerals and strength with aging. This can result in serious bone fractures. Your risk for osteoporosis can be identified using a bone density scan.  If you are 27 years of age or older, or if you are at risk for osteoporosis and fractures, ask your health care provider if you should be screened.  Ask your health care provider whether you should take a calcium or vitamin D supplement to lower your risk for osteoporosis.  Menopause may have certain physical symptoms and risks.  Hormone replacement therapy may reduce some of these symptoms and risks. Talk to your health care provider about whether hormone replacement therapy is right for you. Follow these instructions at home:  Schedule regular health, dental, and eye exams.  Stay current with your immunizations.  Do not use any tobacco products including cigarettes, chewing tobacco, or electronic cigarettes.  If you are pregnant, do not drink alcohol.  If you are breastfeeding, limit how much and how often you drink alcohol.  Limit alcohol intake to no more than 1 drink per day for nonpregnant women. One drink equals 12 ounces of beer, 5 ounces of wine, or 1 ounces of hard liquor.  Do not use street drugs.  Do not share needles.  Ask your health care  provider for help if you need support or information about quitting drugs.  Tell your health care provider if you often feel depressed.  Tell your health care provider if you have ever been abused or do not feel safe at home. This information is not intended to replace advice given to you by your health care provider. Make sure you discuss any questions you have with your health care provider. Document Released: 10/11/2010 Document Revised: 09/03/2015 Document Reviewed: 12/30/2014 Elsevier Interactive Patient Education  2018 [employer].   Please help [place] help you:  We are honored you have chosen [facility] for your Primary Care home. Below you will find basic instructions that you may need to access in the future. Please help [place] help you by reading the instructions, which cover many of the frequent questions we experience.   Prescription refills and request:  -In order to allow more efficient response time, please call your pharmacy for all refills. They will forward the request electronically to [place]. This allows for the quickest possible response. Request left on a nurse line can take longer to refill, since these are checked as time allows between office patients and other phone calls.  - refill request can take up to 3-5 working days to complete.  - If request is sent electronically and request is appropiate, it is usually completed in 1-2 business days.  - all patients will need to be seen routinely for all chronic medical conditions requiring prescription medications (see follow-up below). If you are overdue for follow up on your condition, you will be asked to make an appointment and we will call in enough medication to cover you until your appointment (up to 30 days).  - all controlled substances will require a face to face visit to request/refill.  - if you desire your prescriptions to go through a new pharmacy, and have an active script at original pharmacy, you will need to call  your pharmacy and have scripts transferred to  new pharmacy. This is completed between the pharmacy locations and not by your provider.    Results: If any images or labs were ordered, it can take up to 1 week to get results depending on the test ordered and the lab/facility running and resulting the test. - Normal or stable results, which do not need further discussion, may be released to your mychart immediately with attached note to you. A call may not be generated for normal results. Please make certain to sign up for mychart. If you have questions on how to activate your mychart you can call the front office.  - If your results need further discussion, our office will attempt to contact you via phone, and if unable to reach you after 2 attempts, we will release your abnormal result to your mychart with instructions.  - All results will be automatically released in mychart after 1 week.  - Your provider will provide you with explanation and instruction on all relevant material in your results. Please keep in mind, results and labs may appear confusing or abnormal to the untrained eye, but it does not mean they are actually abnormal for you personally. If you have any questions about your results that are not covered, or you desire more detailed explanation than what was provided, you should make an appointment with your provider to do so.   Our office handles many outgoing and incoming calls daily. If we have not contacted you within 1 week about your results, please check your mychart to see if there is a message first and if not, then contact our office.  In helping with this matter, you help decrease call volume, and therefore allow [place] to be able to respond to patients needs more efficiently.   Acute office visits (sick visit):  An acute visit is intended for a new problem and are scheduled in shorter time slots to allow schedule openings for patients with new problems. This is the appropriate visit  to discuss a new problem. Problems will not be addressed by phone call or Echart message. Appointment is needed if requesting treatment. In order to provide you with excellent quality medical care with proper time for you to explain your problem, have an exam and receive treatment with instructions, these appointments should be limited to one new problem per visit. If you experience a new problem, in which you desire to be addressed, please make an acute office visit, we save openings on the schedule to accommodate you. Please do not save your new problem for any other type of visit, let us take care of it properly and quickly for you.   Follow up visits:  Depending on your condition(s) your provider will need to see you routinely in order to provide you with quality care and prescribe medication(s). Most chronic conditions (Example: hypertension, Diabetes, depression/anxiety... etc), require visits a couple times a year. Your provider will instruct you on proper follow up for your personal medical conditions and history. Please make certain to make follow up appointments for your condition as instructed. Failing to do so could result in lapse in your medication treatment/refills. If you request a refill, and are overdue to be seen on a condition, we will always provide you with a 30 day script (once) to allow you time to schedule.    Medicare wellness (well visit): - we have a wonderful Nurse Maudie Mercury), that will meet with you and provide you will yearly medicare wellness visits. These visits should occur yearly (can  not be scheduled less than 1 calendar year apart) and cover preventive health, immunizations, advance directives and screenings you are entitled to yearly through your medicare benefits. Do not miss out on your entitled benefits, this is when medicare will pay for these benefits to be ordered for you.  These are strongly encouraged by your provider and is the appropriate type of visit to make  certain you are up to date with all preventive health benefits. If you have not had your medicare wellness exam in the last 12 months, please make certain to schedule one by calling the office and schedule your medicare wellness with Maudie Mercury as soon as possible.   Yearly physical (well visit):  - Adults are recommended to be seen yearly for physicals. Check with your insurance and date of your last physical, most insurances require one calendar year between physicals. Physicals include all preventive health topics, screenings, medical exam and labs that are appropriate for gender/age and history. You may have fasting labs needed at this visit. This is a well visit (not a sick visit), new problems should not be covered during this visit (see acute visit).  - Pediatric patients are seen more frequently when they are younger. Your provider will advise you on well child visit timing that is appropriate for your their age. - This is not a medicare wellness visit. Medicare wellness exams do not have an exam portion to the visit. Some medicare companies allow for a physical, some do not allow a yearly physical. If your medicare allows a yearly physical you can schedule the medicare wellness with our nurse Maudie Mercury and have your physical with your provider after, on the same day. Please check with insurance for your full benefits.   Late Policy/No Shows:  - all new patients should arrive 15-30 minutes earlier than appointment to allow [place] time  to  obtain all personal demographics,  insurance information and for you to complete office paperwork. - All established patients should arrive 10-15 minutes earlier than appointment time to update all information and be checked in .  - In our best efforts to run on time, if you are late for your appointment you will be asked to either reschedule or if able, we will work you back into the schedule. There will be a wait time to work you back in the schedule,  depending on  availability.  - If you are unable to make it to your appointment as scheduled, please call 24 hours ahead of time to allow [place] to fill the time slot with someone else who needs to be seen. If you do not cancel your appointment ahead of time, you may be charged a no show fee.

## 2018-01-29 NOTE — Progress Notes (Signed)
Patient ID: Debra Sanchez, female  DOB: [birth year], 77 y.o.   MRN: [id number] Patient Care Team    Relationship Specialty Notifications Start End  Ma Hillock, DO PCP - General Family Medicine  04/20/15     Chief Complaint  Patient presents with  . Annual Exam    Subjective:  Debra Sanchez is a 77 y.o.  Female  present for CPE . All past medical history, surgical history, allergies, family history, immunizations, medications and social history were updated in the electronic medical record today. All recent labs, ED visits and hospitalizations within the last year were reviewed.  Hypothyroidism, unspecified type Reports compliance with levothyroxine 50 mcg daily. TSH 1.39 01/2017. Patient needs refills on medications today.   Essential hypertension Pt reports compliance with Cozaar 100 mg and amlodipine 2.5 g twice a day. Blood pressures ranges at home normal recorded after medication use.Patient denies chest pain, shortness of breath, dizziness or lower extremity edema. She reports the losartan can upset her stomach.  . Pt does not take a daily baby ASA.  BMP: 06/18/2016, within normal limits CBC: 06/18/2016 with hemoglobin 11.5, hematocrit 34.7 Lipids: Collected 2016 Diet: Watches her diet closely Exercise: Exercises routinely RF: Hypertension, obesity, family history heart disease   Anxiety/Insomnia, unspecified type: Patient reports compliance with Zoloft daily. She is doing well on medication.   GERD: taking protonix daily. Has some issues chronically but overall stable.   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 She state her left knee is more painful than her right. The pain is worsened with climbing stairs. She feels the left knee is going to "give out" on her at times. She is unable to tolerate any NSAIDS, secondary to severe gerd.   Health maintenance:  Colonoscopy: > 75 N/A Mammogram: completed:declines mammogram.    Cervical cancer screening: > 65 n/A- hysterectomy Immunizations: tdap UTD 2018, Influenza provided today (encouraged yearly), PNA series 23 provided today, Shingrix script provided today.  DEXA: last completed 2016, result normal Assistive device: none Oxygen XNT:ZGYF Patient has a Dental home. Hospitalizations/ED visits: none   Depression screen [facility] 2/9 01/29/2018 01/16/2017 07/18/2016 04/20/2015  Decreased Interest 0 0 0 0  Down, Depressed, Hopeless 0 0 0 0  PHQ - 2 Score 0 0 0 0  Altered sleeping 1 - - -  Tired, decreased energy 1 - - -  Change in appetite 0 - - -  Feeling bad or failure about yourself  0 - - -  Trouble concentrating 0 - - -  Moving slowly or fidgety/restless 0 - - -  Suicidal thoughts 0 - - -  PHQ-9 Score 2 - - -   No flowsheet data found.   Current Exercise Habits: Home exercise routine, Type of exercise: walking;calisthenics, Time (Minutes): 30, Frequency (Times/Week): 3, Weekly Exercise (Minutes/Week): 90, Intensity: Mild     Immunization History  Administered Date(s) Administered  . Influenza, High Dose Seasonal PF 01/16/2017  . Influenza-Unspecified 02/10/2015  . Tdap 04/12/2006     Past Medical History:  Diagnosis Date  . Anemia   . Anxiety   . Arthritis   . Blood transfusion 2012  . Colon polyp   . DDD (degenerative disc disease), thoracolumbar   . Degenerative disc disease, lumbar   . Family history of adverse reaction to anesthesia    pts sister got very sick / burnt esophagus   . Gastric ulcer   . GERD (gastroesophageal reflux disease)   . Heart  murmur   . History of hiatal hernia   . Hypertension   . Hypothyroidism   . Migraines   . Miscarriage   . Osteoporosis   . Pneumonia yrs ago  . Syncope 2012  . Wears glasses    Allergies  Allergen Reactions  . Hydromorphone Itching, Nausea Only and Other (See Comments)    After IV dilaudid pt flushed and felt dizzy  . Ciprofloxacin Other (See Comments)    Stomach problems  .  Codeine Itching  . Latex Other (See Comments)    fainted  . Morphine And Related Itching  . Penicillins Itching and Nausea And Vomiting    Has patient had a PCN reaction causing immediate rash, facial/tongue/throat swelling, SOB or lightheadedness with hypotension: No Has patient had a PCN reaction causing severe rash involving mucus membranes or skin necrosis: No Has patient had a PCN reaction that required hospitalization No Has patient had a PCN reaction occurring within the last 10 years: Yes If all of the above answers are "NO", then may proceed with Cephalosporin use.   . Sulfa Antibiotics Nausea And Vomiting   Past Surgical History:  Procedure Laterality Date  . ABDOMINAL HYSTERECTOMY    . ANTERIOR CERVICAL DECOMP/DISCECTOMY FUSION     C5-6 and C6-7 anterior cervical diskectomy and fusion   . BILATERAL OOPHORECTOMY    . CATARACT EXTRACTION W/PHACO Left 10/20/2014   Procedure: CATARACT EXTRACTION PHACO AND INTRAOCULAR LENS PLACEMENT LEFT EYE;  Surgeon: Tonny Branch, MD;  Location: [facility];  Service: Ophthalmology;  Laterality: Left;  CDE:6.98  . CATARACT EXTRACTION W/PHACO Right 11/17/2014   Procedure: CATARACT EXTRACTION PHACO AND INTRAOCULAR LENS PLACEMENT RIGHT EYE CDE=6.57;  Surgeon: Tonny Branch, MD;  Location: [facility];  Service: Ophthalmology;  Laterality: Right;  . COLONOSCOPY  01/19/2011   Procedure: COLONOSCOPY;  Surgeon: Rogene Houston, MD;  Location: [facility];  Service: Endoscopy;  Laterality: N/A;  2:00  . DILATION AND CURETTAGE OF UTERUS    . ESOPHAGOGASTRODUODENOSCOPY  11/05/2010   Procedure: ESOPHAGOGASTRODUODENOSCOPY (EGD);  Surgeon: Rogene Houston, MD;  Location: [facility];  Service: Endoscopy;  Laterality: N/A;  7:00 am per [facility]  . ESOPHAGOGASTRODUODENOSCOPY N/A 07/25/2012   Procedure: ESOPHAGOGASTRODUODENOSCOPY (EGD);  Surgeon: Rogene Houston, MD;  Location: [facility];  Service: Endoscopy;  Laterality: N/A;  400-moved to 81 Ann notified pt  .  ESOPHAGOGASTRODUODENOSCOPY N/A 05/27/2015   Procedure: ESOPHAGOGASTRODUODENOSCOPY (EGD);  Surgeon: Rogene Houston, MD;  Location: [facility];  Service: Endoscopy;  Laterality: N/A;  2:00  . HIATAL HERNIA REPAIR N/A 08/25/2015   Procedure: LAPAROSCOPIC REPAIR OF LARGE TYPE III  HIATAL HERNIA;  Surgeon: Johnathan Hausen, MD;  Location: [facility];  Service: General;  Laterality: N/A;  . LAPAROSCOPIC NISSEN FUNDOPLICATION N/A 0/08/3974   Procedure: ENDOSCOPY, HIATAL HERNIA REPAIR, AND TAKE DOWN OF NISSEN FUNDOPILICATION;  Surgeon: Johnathan Hausen, MD;  Location: [facility];  Service: General;  Laterality: N/A;  . 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 Colon cancer Mother   . Heart disease Mother   . Arthritis Mother   . Heart disease Father   . Arthritis Father   . Arthritis Sister   . Arthritis Brother   . Arthritis Maternal Aunt   . Arthritis Maternal Uncle   . Diabetes Maternal Uncle    Social History   Socioeconomic  History  . Marital status: Married    Spouse name: Not on file  . Number of children: Not on file  . Years of education: Not on file  . Highest education level: Not on file  Occupational History  . Not on file  Social Needs  . Financial resource strain: Not on file  . Food insecurity:    Worry: Not on file    Inability: Not on file  . Transportation needs:    Medical: Not on file    Non-medical: Not on file  Tobacco Use  . Smoking status: Never Smoker  . Smokeless tobacco: Never Used  Substance and Sexual Activity  . Alcohol use: No    Alcohol/week: 0.0 standard drinks  . Drug use: No  . Sexual activity: Never    Birth control/protection: None, Surgical  Lifestyle  . Physical activity:    Days per week: Not on file    Minutes per session: Not on file  . Stress: Not on file  Relationships  . Social connections:    Talks on phone: Not on file    Gets  together: Not on file    Attends religious service: Not on file    Active member of club or organization: Not on file    Attends meetings of clubs or organizations: Not on file    Relationship status: Not on file  . Intimate partner violence:    Fear of current or ex partner: Not on file    Emotionally abused: Not on file    Physically abused: Not on file    Forced sexual activity: Not on file  Other Topics Concern  . Not on file  Social History Narrative   Married to [employer].   2 children Kerin Ransom and Anne Ng)    Retired, 2 yrs college.    Caffeine beverages.   Takes a daily vitamin.   Wears her seatbelt. Smoke detector in the home.    Feels safe in her relationships.                   Allergies as of 01/29/2018      Reactions   Hydromorphone Itching, Nausea Only, Other (See Comments)   After IV dilaudid pt flushed and felt dizzy   Ciprofloxacin Other (See Comments)   Stomach problems   Codeine Itching   Latex Other (See Comments)   fainted   Morphine And Related Itching   Penicillins Itching, Nausea And Vomiting   Has patient had a PCN reaction causing immediate rash, facial/tongue/throat swelling, SOB or lightheadedness with hypotension: No Has patient had a PCN reaction causing severe rash involving mucus membranes or skin necrosis: No Has patient had a PCN reaction that required hospitalization No Has patient had a PCN reaction occurring within the last 10 years: Yes If all of the above answers are "NO", then may proceed with Cephalosporin use.   Sulfa Antibiotics Nausea And Vomiting      Medication List        Accurate as of 01/29/18 10:46 AM. Always use your most recent med list.          acetaminophen 325 MG tablet Commonly known as:  TYLENOL Take 325 mg by mouth every 6 (six) hours as needed for mild pain (For pain.).   amLODipine 2.5 MG tablet Commonly known as:  NORVASC Take 1 tablet (2.5 mg total) by mouth 2 (two) times daily.     levothyroxine 50 MCG tablet Commonly known as:  SYNTHROID,  LEVOTHROID Take 1 tablet (50 mcg total) by mouth daily.   losartan 100 MG tablet Commonly known as:  COZAAR Take 1 tablet (100 mg total) by mouth daily.   pantoprazole 40 MG tablet Commonly known as:  PROTONIX Take 1 tablet (40 mg total) by mouth 2 (two) times daily.   sertraline 100 MG tablet Commonly known as:  ZOLOFT Take 1 tablet (100 mg total) by mouth at bedtime.   SUMAtriptan 100 MG tablet Commonly known as:  IMITREX TAKE 1 TABLET BY MOUTH AS  NEEDED FOR MIGRAINE. MAY  REPEAT IN 2 HOURS IF  HEADACHE PERSISTS OR  RECURS.   Zoster Vaccine Adjuvanted injection Commonly known as:  SHINGRIX Inject 0.5 mLs into the muscle once for 1 dose. Rpt dose in 2-6 months.       All past medical history, surgical history, allergies, family history, immunizations andmedications were updated in the EMR today and reviewed under the history and medication portions of their EMR.     No results found for this or any previous visit (from the past 2160 hour(s)).  ROS: 14 pt review of systems performed and negative (unless mentioned in an HPI)  Objective: BP 115/68 (BP Location: Right Arm, Patient Position: Sitting, Cuff Size: Large)   Pulse 70   Temp 98.6 F (37 C)   Resp 20   Ht 5' 8"  (1.727 m)   Wt 195 lb (88.5 kg)   SpO2 97%   BMI 29.65 kg/m  Gen: Afebrile. No acute distress. Nontoxic in appearance, well-developed, well-nourished, overweight caucasian female.  HENT: AT. [place]. Bilateral TM visualized and normal in appearance, normal external auditory canal. MMM, no oral lesions, adequate dentition. Bilateral nares within normal limits. Throat without erythema, ulcerations or exudates. no Cough on exam, no hoarseness on exam. Eyes:Pupils Equal Round Reactive to light, Extraocular movements intact,  Conjunctiva without redness, discharge or icterus. Neck/lymp/endocrine: Supple,no lymphadenopathy, no thyromegaly CV: RRR no  murmur, no edema, +2/4 P posterior tibialis pulses. no carotid bruits. No JVD. Chest: CTAB, no wheeze, rhonchi or crackles. normal Respiratory effort. good Air movement. Abd: Soft. flat. NTND. BS present. no Masses palpated. No hepatosplenomegaly. No rebound tenderness or guarding. Skin: no rashes, purpura or petechiae. Warm and well-perfused. Skin intact. Neuro/Msk:  Normal gait. PERLA. EOMi. Alert. Oriented x3. No erythema or soft tissue swelling of bilateral knees. TTP medial joint line left knee. Crepitus present bilateral. Cranial nerves II through XII intact. Muscle strength 5/5 upper/lower extremity. DTRs equal bilaterally. Psych: Normal affect, dress and demeanor. Normal speech. Normal thought content and judgment.   No exam data present  Assessment/plan: Debra Sanchez is a 77 y.o. female present for CPE. Hypertension, unspecified type/ Stable. Losartan causing some stomach upset, but she is going to try to take it with food instead of changing regimen.  - continue losartan, amlodipine - CBC w/Diff - Comp Met (CMET) - Lipid panel - TSH - losartan (COZAAR) 100 MG tablet; Take 1 tablet (100 mg total) by mouth daily.  Dispense: 90 tablet; Refill: 1 - amLODipine (NORVASC) 2.5 MG tablet; Take 1 tablet (2.5 mg total) by mouth 2 (two) times daily.  Dispense: 180 tablet; Refill: 1 - 6 months Hypothyroidism, unspecified type - TSH - refills will be provided after lab resulted.  Immunization due - Flu vaccine HIGH DOSE PF (Fluzone High dose) - PNA 23 provided today Vitamin D deficiency - Vit D (25 hydroxy) Diabetes mellitus screening - HgB A1c Anxiety Stable.  - sertraline (ZOLOFT) 100 MG tablet;  Take 1 tablet (100 mg total) by mouth at bedtime.  Dispense: 90 tablet; Refill: 1 Chronic pain of both knees/arthritis Discussed options with her today and given she is unable to tolerate any NSAIDS. Trial of cherry tart and/or tumeric. Discussed this may also cause GI upset, if so  stop and be seen if desired prescribed med. Which would be a controlled substance and need a face to face visit. Discussed ortho referral vs image as well, and she will see if supplements are helpful first.  Encounter for health maintenance examination Patient was encouraged to exercise greater than 150 minutes a week. Patient was encouraged to choose a diet filled with fresh fruits and vegetables, and lean meats. AVS provided to patient today for education/recommendation on gender specific health and safety maintenance. Colonoscopy: > 75 N/A Mammogram: completed:declines mammogram.  Cervical cancer screening: > 65 n/A- hysterectomy Immunizations: tdap UTD 2018, Influenza provided today (encouraged yearly), PNA series 23 provided today, Shingrix script provided today.  DEXA: last completed 2016, result normal   Return in about 6 months (around 07/31/2018) for [facility].  1 year for CPE  Electronically signed by: Howard Pouch, DO [place]

## 2018-01-30 ENCOUNTER — Telehealth: Payer: Self-pay | Admitting: Family Medicine

## 2018-01-30 MED ORDER — LEVOTHYROXINE SODIUM 50 MCG PO TABS: 50.0000 ug | ORAL_TABLET | Freq: Every day | ORAL | 3 refills | Status: DC

## 2018-01-30 NOTE — Telephone Encounter (Signed)
Please inform patient the following information: Her labs are normal with the following exceptions:  -Her vit D is 22, desires is ~40 to maintain bone health. Starting OTC vit d 1000u daily with meals should be enough to bring this level to normal range.    - Her LDL, which is the bad cholesterol is higher than desired at 138. Her cardiovascular risk for stroke or heart attack in 10 years is elevated. By calculation with her age, gender, BP, FHX heart disease and her h/o HTN >>the american heart association recommendations are to start a statin medication to lower cholesterol and provide some CV protection as well. If she is agreeable, we will call in a lower dose statin for her to try and follow up in 3 mos with rpt labs.  Diet high in fiber and low saturated fats, as well as increase in exercise to > 164m/week can also help.   I have refilled her thyroid med as well at same dose, since lab was normal.

## 2018-01-30 NOTE — Telephone Encounter (Signed)
Spoke with patient reviewed lab results and instructions. Patient verbalized understanding.patient says she does not want to start medication for her cholesterol at this time. She will follow diet and exercise instructions.

## 2018-06-01 ENCOUNTER — Other Ambulatory Visit: Payer: Self-pay | Admitting: Family Medicine

## 2018-06-04 NOTE — Telephone Encounter (Signed)
RF request for Imitrex  LOV: 01/29/2018, LOV for Headache 02/202019  Next ov: 08/01/2018 Last written: 10/24/2017 #10 x1 refill    Please Advise

## 2018-06-05 ENCOUNTER — Ambulatory Visit: Payer: Self-pay

## 2018-06-05 NOTE — Telephone Encounter (Signed)
Patient has been scheduled for 06/06/18 with Dr. Anitra Lauth.

## 2018-06-05 NOTE — Telephone Encounter (Signed)
FYI. Pt has apt tomorrow (06/06/18) at 11:00am.

## 2018-06-05 NOTE — Telephone Encounter (Signed)
Sent to Diane to schedule ASAP

## 2018-06-05 NOTE — Telephone Encounter (Signed)
Patient called and says she's having depression and trouble sleeping. She says "last Wednesday, I was in my bible study group and we were having a discussion on what we read in the bible. All of a sudden, I just said something that was inappropriate. Everyone was looking at me, I was embarrassed. Then it was like someone pushed a pause button, I couldn't hear anything. I could see the person who was in front talking, but I couldn't hear what she was saying. This feeling lasted a few minutes. When I realized what had happened, they were dismissing with prayer. I don't remember anything that was discussed. I got in the elevator and the same thing happened again. I have difficulty sleeping, my mind races, I have trouble concentrating and doing things that I normally would do. I go to my shop every day, but don't have a desire to do anything. I just can't make myself do anything at all." I asked about feelings/thoughts of suicide or harming someone, she denies both. I asked about stress, she says "yes. My daughter had heart surgery in October, then got an infection. I thought she was going to die. She's developed pneumonia and I was worried about her. I also worry because my granddaughter who was a drug addict at 51 years old is back with my daughter, but she went through rehab and has a job now. She is more like another child and that is bothering me as well." I asked about other health symptoms, she says "I have dizziness at night when I lay down and turn over. Other than the dizziness, I had the same things happen to me in 1975 when I had a mental breakdown, got depressed. That's why I know this is depression. I need something to help me through this and to help me sleep at night." According to protocol, see PCP within 24 hours, no availability with PCP, only slots available are Same Day slots for 15 minutes. I called to the office and spoke with Levander Campion, [facility] who advised to send this over to the office and she would  get it to Dr. Raoul Pitch, she says to let the patient know she will receive a call back with an appointment. I advised the patient, she says to call her cell [phone] first, then her home number [phone]. Care advice given, she verbalized understanding.  Reason for Disposition . [1] Depression AND [2] worsening (e.g.,sleeping poorly, less able to do activities of daily living)  Answer Assessment - Initial Assessment Questions 1. CONCERN: "What happened that made you call today?"     I said something inappropriate in bible study last Wednesday 2. DEPRESSION SYMPTOM SCREENING: "How are you feeling overall?" (e.g., decreased energy, increased sleeping or difficulty sleeping, difficulty concentrating, feelings of sadness, guilt, hopelessness, or worthlessness)     Difficulty sleeping, mind racing, feelings of embarrassment, difficulty concentrating 3. RISK OF HARM - SUICIDAL IDEATION:  "Do you ever have thoughts of hurting or killing yourself?"  (e.g., yes, no, no but preoccupation with thoughts about death)   - INTENT:  "Do you have thoughts of hurting or killing yourself right NOW?" (e.g., yes, no, N/A) No   - PLAN: "Do you have a specific plan for how you would do this?" (e.g., gun, knife, overdose, no plan, N/A)     No 4. RISK OF HARM - HOMICIDAL IDEATION:  "Do you ever have thoughts of hurting or killing someone else?"  (e.g., yes, no, no but preoccupation with thoughts about death)   -  INTENT:  "Do you have thoughts of hurting or killing someone right NOW?" (e.g., yes, no, N/A) No   - PLAN: "Do you have a specific plan for how you would do this?" (e.g., gun, knife, no plan, N/A)     No 5. FUNCTIONAL IMPAIRMENT: "How have things been going for you overall in your life? Have you had any more difficulties than usual doing your normal daily activities?"  (e.g., better, same, worse; self-care, school, work, interactions)    I don't have the intrest to do anything, just don't want to do  anything 6. SUPPORT: "Who is with you now?" "Who do you live with?" "Do you have family or friends nearby who you can talk to?"      I live with my husband who is a good support person, daughter, family, friends 87. THERAPIST: "Do you have a counselor or therapist? Name?"     No 8. STRESSORS: "Has there been any new stress or recent changes in your life?"     Yes, my daughter's had heart surgery in October and got infection afterwards, then got pneumonia, afraid she would die. My granddaughter on drugs since 62 years old-went to rehab and is now working 9. DRUG ABUSE/ALCOHOL: "Do you drink alcohol or use any illegal drugs?"      No 10. OTHER: "Do you have any other health or medical symptoms right now?" (e.g., fever)       Dizziness at night 11. PREGNANCY: "Is there any chance you are pregnant?" "When was your last menstrual period?"       No  Protocols used: DEPRESSION-A-AH

## 2018-06-05 NOTE — Telephone Encounter (Signed)
Noted  

## 2018-06-06 ENCOUNTER — Telehealth: Payer: Self-pay

## 2018-06-06 ENCOUNTER — Encounter: Payer: Self-pay | Admitting: Family Medicine

## 2018-06-06 ENCOUNTER — Ambulatory Visit (INDEPENDENT_AMBULATORY_CARE_PROVIDER_SITE_OTHER): Payer: Medicare Other | Admitting: Family Medicine

## 2018-06-06 VITALS — BP 105/67 | HR 82 | Temp 98.1°F | Resp 16 | Ht 68.0 in | Wt 192.1 lb

## 2018-06-06 DIAGNOSIS — E663 Overweight: Secondary | ICD-10-CM | POA: Diagnosis not present

## 2018-06-06 DIAGNOSIS — F339 Major depressive disorder, recurrent, unspecified: Secondary | ICD-10-CM | POA: Diagnosis not present

## 2018-06-06 DIAGNOSIS — F430 Acute stress reaction: Secondary | ICD-10-CM

## 2018-06-06 DIAGNOSIS — F419 Anxiety disorder, unspecified: Secondary | ICD-10-CM

## 2018-06-06 MED ORDER — SERTRALINE HCL 100 MG PO TABS: 100.0000 mg | ORAL_TABLET | Freq: Every day | ORAL | 0 refills | Status: DC

## 2018-06-06 NOTE — Progress Notes (Signed)
OFFICE VISIT  06/06/2018   CC:  Chief Complaint  Patient presents with  . Altered Mental Status    Pt states she has felt "emotionally funny" x2 months. Pt is getting paranoid about what others think of her and trouble sleeping.    HPI:    Patient is a 78 y.o. Caucasian female who presents for depressed mood and anxiety.  Has had some problems with insomnia lately, taking melatonin and it helped some. She has been having poor motivation/losing interest in her work.  Denies crying spells. Was recently in a prayer meeting and she felt a sense of the room going quiet and things going in slow motion. Felt panicky but couldn't move to react to anything.  After her spell in prayer meeting she has felt like the people in the meeting were talking about her, "paranoid". This feeling resolved in the next day or so.  No visual or auditory hallucinations.   Mind racing with thoughts of embarrassment about the issue at recent prayer meeting.  Five days ago she started increasing her zoloft to 200mg  qd and she says this helped-->racing thoughts/brain "spinning" has resolved. Lots of stress lately: daughter had heart surgery, lots of complications, almost died twice per pt. Grand daughter is an addict, just got off drugs 12/2017 but still not taking care of her kids well.  She is very immature, placing lots of stress on everyone in family.    Denies feeling any problematic impulsivity at this time.  She says she has had NO thoughts of SI or HI during this period. Doesn't currently feel any paranoia.  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  Past Medical History:  Diagnosis Date  . Anemia   . Anxiety   . Arthritis   . Blood transfusion 2012  . Colon polyp   . DDD (degenerative disc disease), thoracolumbar   .  Degenerative disc disease, lumbar   . Family history of adverse reaction to anesthesia    pts sister got very sick / burnt esophagus   . Gastric ulcer   . GERD (gastroesophageal reflux disease)   . Heart murmur   . History of hiatal hernia   . Hypertension   . Hypothyroidism   . Migraines   . Miscarriage   . Osteoporosis   . Pneumonia yrs ago  . Syncope 2012  . Wears glasses     Past Surgical History:  Procedure Laterality Date  . ABDOMINAL HYSTERECTOMY    . ANTERIOR CERVICAL DECOMP/DISCECTOMY FUSION     C5-6 and C6-7 anterior cervical diskectomy and fusion   . BILATERAL OOPHORECTOMY    . CATARACT EXTRACTION W/PHACO Left 10/20/2014   Procedure: CATARACT EXTRACTION PHACO AND INTRAOCULAR LENS PLACEMENT LEFT EYE;  Surgeon: Tonny Branch, MD;  Location: [facility];  Service: Ophthalmology;  Laterality: Left;  CDE:6.98  . CATARACT EXTRACTION W/PHACO Right 11/17/2014   Procedure: CATARACT EXTRACTION PHACO AND INTRAOCULAR LENS PLACEMENT RIGHT EYE CDE=6.57;  Surgeon: Tonny Branch, MD;  Location: [facility];  Service: Ophthalmology;  Laterality: Right;  . COLONOSCOPY  01/19/2011   Procedure: COLONOSCOPY;  Surgeon: Rogene Houston, MD;  [facility]: [facility];  Service: Endoscopy;  Laterality: N/A;  2:00  . DILATION AND CURETTAGE OF UTERUS    . ESOPHAGOGASTRODUODENOSCOPY  11/05/2010   Procedure:  ESOPHAGOGASTRODUODENOSCOPY (EGD);  Surgeon: Rogene Houston, MD;  [facility]: [facility];  Service: Endoscopy;  Laterality: N/A;  7:00 am per [facility]  . ESOPHAGOGASTRODUODENOSCOPY N/A 07/25/2012   Procedure: ESOPHAGOGASTRODUODENOSCOPY (EGD);  Surgeon: Rogene Houston, MD;  [facility];  Service: Endoscopy;  Laterality: N/A;  400-moved to 52 Ann notified pt  . ESOPHAGOGASTRODUODENOSCOPY N/A 05/27/2015   Procedure: ESOPHAGOGASTRODUODENOSCOPY (EGD);  Surgeon: Rogene Houston, MD;  [facility]: [facility];  Service: Endoscopy;  Laterality: N/A;  2:00  . HIATAL HERNIA REPAIR N/A 08/25/2015   Procedure:  LAPAROSCOPIC REPAIR OF LARGE TYPE III  HIATAL HERNIA;  Surgeon: Johnathan Hausen, MD;  Location: [facility];  Service: General;  Laterality: N/A;  . LAPAROSCOPIC NISSEN FUNDOPLICATION N/A 11/12/3823   Procedure: ENDOSCOPY, HIATAL HERNIA REPAIR, AND TAKE DOWN OF NISSEN FUNDOPILICATION;  Surgeon: Johnathan Hausen, MD;  Location: [facility];  Service: General;  Laterality: N/A;  . 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 acetaminophen (TYLENOL) 325 MG tablet Take 325 mg by mouth every 6 (six) hours as needed for mild pain (For pain.).     Marland Kitchen amLODipine (NORVASC) 2.5 MG tablet Take 1 tablet (2.5 mg total) by mouth 2 (two) times daily. 180 tablet 1  . esomeprazole (NEXIUM) 40 MG capsule Take 40 mg by mouth daily at 12 noon.    Marland Kitchen levothyroxine (SYNTHROID, LEVOTHROID) 50 MCG tablet TAKE 1 TABLET BY MOUTH  DAILY 90 tablet 3  . levothyroxine (SYNTHROID, LEVOTHROID) 50 MCG tablet Take 1 tablet (50 mcg total) by mouth daily. 90 tablet 3  . losartan (COZAAR) 100 MG tablet Take 1 tablet (100 mg total) by mouth daily. 90 tablet 1  . SUMAtriptan (IMITREX) 100 MG tablet TAKE 1 TABLET BY MOUTH AS  NEEDED FOR MIGRAINE. MAY  REPEAT IN 2 HOURS IF  HEADACHE PERSISTS OR  RECURS. 10 tablet 1  . sertraline (ZOLOFT) 100 MG tablet Take 1 tablet (100 mg total) by mouth at bedtime. 90 tablet 1  . pantoprazole (PROTONIX) 40 MG tablet Take 1 tablet (40 mg total) by mouth 2 (two) times daily. (Patient not taking: Reported on 06/06/2018) 180 tablet 3   No facility-administered medications prior to visit.     Allergies  Allergen Reactions  . Hydromorphone Itching, Nausea Only and Other (See Comments)    After IV dilaudid pt flushed and felt dizzy  . Ciprofloxacin Other (See Comments)    Stomach problems  . Codeine Itching  . Latex Other (See Comments)    fainted  . Morphine And Related Itching  .  Nsaids Nausea And Vomiting  . Penicillins Itching and Nausea And Vomiting    Has patient had a PCN reaction causing immediate rash, facial/tongue/throat swelling, SOB or lightheadedness with hypotension: No Has patient had a PCN reaction causing severe rash involving mucus membranes or skin necrosis: No Has patient had a PCN reaction that required hospitalization No Has patient had a PCN reaction occurring within the last 10 years: Yes If all of the above answers are "NO", then may proceed with Cephalosporin use.   . Sulfa Antibiotics Nausea And Vomiting    ROS As per HPI  PE: Blood pressure 105/67, pulse 82, temperature 98.1 F (36.7 C), temperature source Oral, resp. rate 16, height 5\' 8"  (1.727 m), weight 192 lb 2 oz (87.1 kg), SpO2 98 %.  Gen: Alert, well appearing.  Patient is oriented to person, place, time, and situation. AFFECT: pleasant, lucid thought and speech. She is calm and w/out any pressured speech.  Occasionally is long-winded but not tangential.  No crying.  Eye contact is appropriate. Neuro: CN 2-12 intact bilaterally, strength 5/5 in proximal and distal upper extremities and lower extremities bilaterally.  No tremor.  No disdiadochokinesis.  No ataxia.  Upper extremity and lower extremity DTRs symmetric.  No pronator drift.   LABS:    Chemistry      Component Value Date/Time   NA 140 01/29/2018 1046   K 4.0 01/29/2018 1046   CL 103 01/29/2018 1046   CO2 31 01/29/2018 1046   BUN 14 01/29/2018 1046   CREATININE 0.94 01/29/2018 1046      Component Value Date/Time   CALCIUM 9.4 01/29/2018 1046   ALKPHOS 72 01/29/2018 1046   AST 11 01/29/2018 1046   ALT 9 01/29/2018 1046   BILITOT 0.5 01/29/2018 1046      IMPRESSION AND PLAN:  Acute stress reaction, triggering recurrence of depression and severe anxiety, insomnia. This is all secondary to her incredibly stressful psychosocial stressors lately (see HPI). She is improving a lot since increasing her  sertraline to 200 mg qd. She says she doesn't think a psychologist or counselor will be helpful for her, although I felt like she should see one. She'll continue on 200 mg sertraline qd and f/u with Dr. Raoul Pitch in 3-4 wks.  An After Visit Summary was printed and given to the patient.  FOLLOW UP: Return for 3-4 weeks with Dr. Raoul Pitch (30 min appt)-anxiety.  Signed:  Crissie Sickles, MD           06/06/2018

## 2018-06-06 NOTE — Telephone Encounter (Signed)
Received faxed refill request from [facility] for patients Imitrex.  RF request for Imitrex LOV: 06/06/2018 Dr Anitra Lauth  (01/29/2018 for [facility]) Next ov: Not scheduled  Last written: 10/24/2017    Please Advise

## 2018-07-02 ENCOUNTER — Ambulatory Visit: Payer: Medicare Other | Admitting: Family Medicine

## 2018-07-11 ENCOUNTER — Telehealth (INDEPENDENT_AMBULATORY_CARE_PROVIDER_SITE_OTHER): Payer: Self-pay | Admitting: Internal Medicine

## 2018-07-11 NOTE — Telephone Encounter (Signed)
Patient called stated she wants to be put back on generic Nexium and wants to know if this can be called into pharmacy - ph# [phone]

## 2018-07-15 ENCOUNTER — Other Ambulatory Visit: Payer: Self-pay | Admitting: Family Medicine

## 2018-07-15 DIAGNOSIS — F419 Anxiety disorder, unspecified: Secondary | ICD-10-CM

## 2018-07-16 ENCOUNTER — Other Ambulatory Visit (INDEPENDENT_AMBULATORY_CARE_PROVIDER_SITE_OTHER): Payer: Self-pay | Admitting: *Deleted

## 2018-07-16 MED ORDER — ESOMEPRAZOLE MAGNESIUM 40 MG PO CPDR: 40.0000 mg | DELAYED_RELEASE_CAPSULE | Freq: Every day | ORAL | 4 refills | Status: DC

## 2018-07-16 NOTE — Telephone Encounter (Signed)
This was reviewed with Dr.Rehman. He gave permission for the generic Nexium and this has been e-scribed to the patient's mail order pharmacy. Patient is aware.

## 2018-07-16 NOTE — Telephone Encounter (Signed)
Patient called again today about switching her medication back to generic nexium

## 2018-08-01 ENCOUNTER — Ambulatory Visit: Payer: Medicare Other | Admitting: Family Medicine

## 2018-08-24 ENCOUNTER — Ambulatory Visit: Payer: Medicare Other | Admitting: Family Medicine

## 2018-09-10 ENCOUNTER — Other Ambulatory Visit: Payer: Self-pay | Admitting: Family Medicine

## 2018-09-10 DIAGNOSIS — I10 Essential (primary) hypertension: Secondary | ICD-10-CM

## 2018-09-17 ENCOUNTER — Other Ambulatory Visit: Payer: Self-pay | Admitting: Family Medicine

## 2018-09-17 DIAGNOSIS — F419 Anxiety disorder, unspecified: Secondary | ICD-10-CM

## 2018-09-27 ENCOUNTER — Ambulatory Visit: Payer: Self-pay

## 2018-09-27 ENCOUNTER — Encounter (HOSPITAL_COMMUNITY): Payer: Self-pay | Admitting: Emergency Medicine

## 2018-09-27 ENCOUNTER — Emergency Department (HOSPITAL_COMMUNITY): Payer: Medicare Other

## 2018-09-27 ENCOUNTER — Other Ambulatory Visit: Payer: Self-pay

## 2018-09-27 ENCOUNTER — Emergency Department (HOSPITAL_COMMUNITY)
Admission: EM | Admit: 2018-09-27 | Discharge: 2018-09-27 | Disposition: A | Discharge status: Home / Self Care | Payer: Medicare Other | Attending: Emergency Medicine | Admitting: Emergency Medicine

## 2018-09-27 DIAGNOSIS — R1310 Dysphagia, unspecified: Secondary | ICD-10-CM | POA: Insufficient documentation

## 2018-09-27 DIAGNOSIS — Z91040 Latex allergy status: Secondary | ICD-10-CM | POA: Insufficient documentation

## 2018-09-27 DIAGNOSIS — I10 Essential (primary) hypertension: Secondary | ICD-10-CM | POA: Insufficient documentation

## 2018-09-27 DIAGNOSIS — R072 Precordial pain: Secondary | ICD-10-CM | POA: Diagnosis not present

## 2018-09-27 DIAGNOSIS — E039 Hypothyroidism, unspecified: Secondary | ICD-10-CM | POA: Diagnosis not present

## 2018-09-27 LAB — CBC WITH DIFFERENTIAL/PLATELET
Abs Immature Granulocytes: 0.02 10*3/uL (ref 0.00–0.07)
Basophils Absolute: 0 10*3/uL (ref 0.0–0.1)
Basophils Relative: 0 %
Eosinophils Absolute: 0.1 10*3/uL (ref 0.0–0.5)
Eosinophils Relative: 1 %
HCT: 35.6 % — ABNORMAL LOW (ref 36.0–46.0)
Hemoglobin: 11.1 g/dL — ABNORMAL LOW (ref 12.0–15.0)
Immature Granulocytes: 0 %
Lymphocytes Relative: 17 %
Lymphs Abs: 1.1 10*3/uL (ref 0.7–4.0)
MCH: 28.5 pg (ref 26.0–34.0)
MCHC: 31.2 g/dL (ref 30.0–36.0)
MCV: 91.3 fL (ref 80.0–100.0)
Monocytes Absolute: 0.4 10*3/uL (ref 0.1–1.0)
Monocytes Relative: 6 %
Neutro Abs: 5 10*3/uL (ref 1.7–7.7)
Neutrophils Relative %: 76 %
Platelets: 173 10*3/uL (ref 150–400)
RBC: 3.9 MIL/uL (ref 3.87–5.11)
RDW: 14 % (ref 11.5–15.5)
WBC: 6.6 10*3/uL (ref 4.0–10.5)
nRBC: 0 % (ref 0.0–0.2)

## 2018-09-27 LAB — COMPREHENSIVE METABOLIC PANEL
ALT: 13 U/L (ref 0–44)
AST: 14 U/L — ABNORMAL LOW (ref 15–41)
Albumin: 3.6 g/dL (ref 3.5–5.0)
Alkaline Phosphatase: 71 U/L (ref 38–126)
Anion gap: 7 (ref 5–15)
BUN: 14 mg/dL (ref 8–23)
CO2: 24 mmol/L (ref 22–32)
Calcium: 8.9 mg/dL (ref 8.9–10.3)
Chloride: 106 mmol/L (ref 98–111)
Creatinine, Ser: 0.87 mg/dL (ref 0.44–1.00)
GFR calc Af Amer: >60 mL/min (ref >60)
GFR calc non Af Amer: >60 mL/min (ref >60)
Glucose, Bld: 90 mg/dL (ref 70–99)
Potassium: 4.2 mmol/L (ref 3.5–5.1)
Sodium: 137 mmol/L (ref 135–145)
Total Bilirubin: 0.4 mg/dL (ref 0.3–1.2)
Total Protein: 6.2 g/dL — ABNORMAL LOW (ref 6.5–8.1)

## 2018-09-27 LAB — TROPONIN I: Troponin I: <0.03 ng/mL (ref <0.03)

## 2018-09-27 LAB — LIPASE, BLOOD: Lipase: 28 U/L (ref 11–51)

## 2018-09-27 MED ORDER — ALUM & MAG HYDROXIDE-SIMETH 200-200-20 MG/5ML PO SUSP
15.0000 mL | Freq: Once | ORAL | Status: AC
  Administered 2018-09-27: 15 mL via ORAL
  Filled 2018-09-27: qty 30

## 2018-09-27 MED ORDER — FAMOTIDINE 20 MG PO TABS: 20.0000 mg | ORAL_TABLET | Freq: Every day | ORAL | 0 refills | Status: DC

## 2018-09-27 NOTE — Telephone Encounter (Signed)
Pt called stating that last night she felt like she could not swallow.  She got up and drank some water but that caused severe esophagus pain. She states it feels now like her esophagus is raw and swollen. She has Hx of hiatal hernia and states that 1/3 of her stomach protruded into her esophagus.  She see Dr Laural Golden for this problem.  Per protocol pt will go to ER for evaluation.  Care advice read to patient. Patient verbalized understanding.  Reason for Disposition . Symptoms of food or bone stuck in throat or esophagus (e.g., pain in throat or chest, FB sensation, blood-tinged saliva)  Answer Assessment - Initial Assessment Questions 1. SYMPTOM: "Are you having difficulty swallowing liquids, solids, or both?"     Painful swallowing 2. ONSET: "When did the swallowing problems begin?"      Last night 3. CAUSE: "What do you think is causing the problem?"      Hiatal hernia esopagus pain 4. CHRONIC/RECURRENT: "Is this a new problem for you?"  If no, ask: "How long have you had this problem?" (e.g., days, weeks, months)      no 5. OTHER SYMPTOMS: "Do you have any other symptoms?" (e.g., difficulty breathing, sore throat, swollen tongue, chest pain)     Raw swollen throat,  6. PREGNANCY: "Is there any chance you are pregnant?" "When was your last menstrual period?"    N/A  Protocols used: SWALLOWING DIFFICULTY-A-AH

## 2018-09-27 NOTE — ED Triage Notes (Signed)
Patient reports difficulty swallowing that started last night. Patient has history of hiatal hernia with surgery that had to be reversed. Patient states she got up last night and drank water but still feels like there is something lodged in her esophagus. Patient states she contacted GI this am and was told to come to the ED for possible food bolus in the esophagus.

## 2018-09-27 NOTE — ED Provider Notes (Signed)
Emergency Department Provider Note   I have reviewed the triage vital signs and the nursing notes.   HISTORY  Chief Complaint Dysphagia   HPI Debra Sanchez is a 78 y.o. female with PMH of HTN, GERD, hiatal hernia followed by Dr. Laural Golden who presents to the emergency department with difficulty swallowing and chest discomfort.  Symptoms began last night.  Patient states she awoke with pain in her chest and throat.  She drank water and felt like it was lodged in her esophagus.  She has had issues like this related to her hiatal hernia in the past.  She states the pain seemed to travel downward while drinking water but then felt stuck in her chest.  She had tightness and pressure last night that has since resolved.  This morning she feels improved but continues to have a globus sensation more in her throat.  Denies chest pain or shortness of breath currently.  She does report that this feels similar to prior GI issues in the past.  She reached out to the GI office this morning and was referred to the ED for evaluation.    Past Medical History:  Diagnosis Date  . Anemia   . Anxiety   . Arthritis   . Blood transfusion 2012  . Colon polyp   . DDD (degenerative disc disease), thoracolumbar   . Degenerative disc disease, lumbar   . Family history of adverse reaction to anesthesia    pts sister got very sick / burnt esophagus   . Gastric ulcer   . GERD (gastroesophageal reflux disease)   . Heart murmur   . History of hiatal hernia   . Hypertension   . Hypothyroidism   . Migraines   . Miscarriage   . Osteoporosis   . Pneumonia yrs ago  . Syncope 2012  . Wears glasses     Patient Active Problem List   Diagnosis Date Noted  . Chronic pain of both knees 01/29/2018  . Persistent headaches 11/18/2016  . Hiatal hernia with GERD 06/17/2016  . Status post laparoscopic Nissen fundoplication 35/46/5681  . Pulmonary nodules 05/12/2015  . Insomnia 04/20/2015  . Hypertension  05/15/2012  . Hypothyroidism 05/15/2012  . History of colonic polyps 05/15/2012  . Anemia 05/15/2012  . Heme positive stool 05/15/2012    Past Surgical History:  Procedure Laterality Date  . ABDOMINAL HYSTERECTOMY    . ANTERIOR CERVICAL DECOMP/DISCECTOMY FUSION     C5-6 and C6-7 anterior cervical diskectomy and fusion   . BILATERAL OOPHORECTOMY    . CATARACT EXTRACTION W/PHACO Left 10/20/2014   Procedure: CATARACT EXTRACTION PHACO AND INTRAOCULAR LENS PLACEMENT LEFT EYE;  Surgeon: Tonny Branch, MD;  Location: [facility];  Service: Ophthalmology;  Laterality: Left;  CDE:6.98  . CATARACT EXTRACTION W/PHACO Right 11/17/2014   Procedure: CATARACT EXTRACTION PHACO AND INTRAOCULAR LENS PLACEMENT RIGHT EYE CDE=6.57;  Surgeon: Tonny Branch, MD;  Location: [facility];  Service: Ophthalmology;  Laterality: Right;  . COLONOSCOPY  01/19/2011   Procedure: COLONOSCOPY;  Surgeon: Rogene Houston, MD;  [facility];  Service: Endoscopy;  Laterality: N/A;  2:00  . DILATION AND CURETTAGE OF UTERUS    . ESOPHAGOGASTRODUODENOSCOPY  11/05/2010   Procedure: ESOPHAGOGASTRODUODENOSCOPY (EGD);  Surgeon: Rogene Houston, MD;  [facility];  Service: Endoscopy;  Laterality: N/A;  7:00 am per [facility]  . ESOPHAGOGASTRODUODENOSCOPY N/A 07/25/2012   Procedure: ESOPHAGOGASTRODUODENOSCOPY (EGD);  Surgeon: Rogene Houston, MD;  [facility];  Service: Endoscopy;  Laterality: N/A;  400-moved to 68 Ann notified pt  . ESOPHAGOGASTRODUODENOSCOPY N/A 05/27/2015   Procedure: ESOPHAGOGASTRODUODENOSCOPY (EGD);  Surgeon: Rogene Houston, MD;  [facility];  Service: Endoscopy;  Laterality: N/A;  2:00  . HIATAL HERNIA REPAIR N/A 08/25/2015   Procedure: LAPAROSCOPIC REPAIR OF LARGE TYPE III  HIATAL HERNIA;  Surgeon: Johnathan Hausen, MD;  Location: [facility];  Service: General;  Laterality: N/A;  . LAPAROSCOPIC NISSEN FUNDOPLICATION N/A 12/19/3714   Procedure: ENDOSCOPY, HIATAL HERNIA REPAIR, AND TAKE DOWN OF  NISSEN FUNDOPILICATION;  Surgeon: Johnathan Hausen, MD;  Location: [facility];  Service: General;  Laterality: N/A;  . 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 Colon cancer Mother   . Heart disease Mother   . Arthritis Mother   . Heart disease Father   . Arthritis Father   . Arthritis Sister   . Arthritis Brother   . Arthritis Maternal Aunt   . Arthritis Maternal Uncle   . Diabetes Maternal Uncle     Social History Social History   Tobacco Use  . Smoking status: Never Smoker  . Smokeless tobacco: Never Used  Substance Use Topics  . Alcohol use: No    Alcohol/week: 0.0 standard drinks  . Drug use: No    Review of Systems  Constitutional: No fever/chills Eyes: No visual changes. ENT: Positive sore throat. Cardiovascular: Positive chest pain. Respiratory: Denies shortness of breath. Gastrointestinal: No abdominal pain.  No nausea, no vomiting.  No diarrhea.  No constipation. Genitourinary: Negative for dysuria. Musculoskeletal: Negative for back pain. Skin: Negative for rash. Neurological: Negative for headaches, focal weakness or numbness.  10-point ROS otherwise negative.  ____________________________________________   PHYSICAL EXAM:  VITAL SIGNS: ED Triage Vitals  Enc Vitals Group     BP 09/27/18 1139 (!) 168/72     Pulse Rate 09/27/18 1139 87     Resp 09/27/18 1139 18     Temp 09/27/18 1139 98.5 F (36.9 C)     Temp Source 09/27/18 1139 Oral     SpO2 09/27/18 1139 96 %     Weight 09/27/18 1140 184 lb (83.5 kg)     Height 09/27/18 1140 5\' 8"  (1.727 m)   Constitutional: Alert and oriented. Well appearing and in no acute distress. Eyes: Conjunctivae are normal.  Head: Atraumatic. Nose: No congestion/rhinnorhea. Mouth/Throat:  Mucous membranes are moist. Neck: No stridor.  Cardiovascular: Normal rate, regular rhythm. Good peripheral circulation. Grossly normal heart sounds.   Respiratory: Normal respiratory effort.  No retractions. Lungs CTAB. Gastrointestinal: Soft and nontender. No distention.  Musculoskeletal: No lower extremity tenderness nor edema. No gross deformities of extremities. Neurologic:  Normal speech and language. No gross focal neurologic deficits are appreciated.  Skin:  Skin is warm, dry and intact. No rash noted.  ____________________________________________   LABS (all labs ordered are listed, but only abnormal results are displayed)  Labs Reviewed  COMPREHENSIVE METABOLIC PANEL - Abnormal; Notable for the following components:      Result Value   Total Protein 6.2 (*)    AST 14 (*)    All other components within normal limits  CBC WITH DIFFERENTIAL/PLATELET - Abnormal; Notable for the following components:   Hemoglobin 11.1 (*)    HCT 35.6 (*)  All other components within normal limits  LIPASE, BLOOD  TROPONIN I   ____________________________________________  EKG   EKG Interpretation  Date/Time:  Thursday September 27 2018 12:13:13 EDT Ventricular Rate:  82 PR Interval:    QRS Duration: 91 QT Interval:  368 QTC Calculation: 430 R Axis:   40 Text Interpretation:  Sinus rhythm Low voltage, precordial leads No STEMI  Confirmed by Nanda Quinton [phone]) on 09/27/2018 12:21:19 PM       ____________________________________________  RADIOLOGY  Dg Chest Portable 1 View  Result Date: 09/27/2018 CLINICAL DATA:  Patient reports difficulty swallowing that started last night. Patient has history of hiatal hernia with surgery that had to be reversed. Patient states she got up last night and drank water but still feels like there is something lodged in her esophagus. Patient states she contacted GI this am and was told to come to the ED for possible food bolus in the esophagus EXAM:  PORTABLE CHEST 1 VIEW COMPARISON:  01/01/2016 FINDINGS: Cardiac silhouette is normal in size. No mediastinal or hilar masses. No evidence of adenopathy. Clear lungs.  No pleural effusion or pneumothorax. Skeletal structures are grossly intact. IMPRESSION: No active disease. Electronically Signed   By: Lajean Manes M.D.   On: 09/27/2018 13:06    ____________________________________________   PROCEDURES  Procedure(s) performed:   Procedures  None  ____________________________________________   INITIAL IMPRESSION / ASSESSMENT AND PLAN / ED COURSE  Pertinent labs & imaging results that were available during my care of the patient were reviewed by me and considered in my medical decision making (see chart for details).   Patient presents to the emergency department for evaluation of difficulty swallowing and some chest discomfort last night.  Mostly globus sensation at this point.  No active chest pain.  Lower suspicion for ACS given patient's GI history.  Ate breakfast and took her medications this morning.  Low suspicion for acute food impaction.  Do plan for chest x-ray, screening labs, Maalox, and will reach out to her GI on-call.   01:30 PM  Spoke with Dr. Laural Golden regarding the presentation and w/u. No acute findings. His office will schedule an esophagram next week. Will start Pepcid 20 mg at night along with the Nexium the patient is already taking. Discussed ED return precautions. CXR and labs reviewed.  ____________________________________________  FINAL CLINICAL IMPRESSION(S) / ED DIAGNOSES  Final diagnoses:  Precordial chest pain  Odynophagia     MEDICATIONS GIVEN DURING THIS VISIT:  Medications  alum & mag hydroxide-simeth (MAALOX/MYLANTA) 200-200-20 MG/5ML suspension 15 mL (15 mLs Oral Given 09/27/18 1209)     NEW OUTPATIENT MEDICATIONS STARTED DURING THIS VISIT:  Discharge Medication List as of 09/27/2018  1:47 PM    START taking these medications   Details   famotidine (PEPCID) 20 MG tablet Take 1 tablet (20 mg total) by mouth at bedtime., Starting Thu 09/27/2018, Print        Note:  This document was prepared using Dragon voice recognition software and may include unintentional dictation errors.  Nanda Quinton, MD Emergency Medicine    Long, [facility], MD 09/27/18 [phone]

## 2018-09-27 NOTE — Discharge Instructions (Signed)
You were seen in the emergency department today with pain in the chest and difficulty/painful swallowing.  Your lab work and x-ray today were normal.  I spoke with your gastroenterologist by phone.  I am adding Pepcid which he will take at bedtime in addition to your Nexium.  The gastroenterologist will schedule an outpatient procedure, esophagram, and schedule a follow-up appointment to review the results.  You should call to confirm these appointments if you do not hear from the office by tomorrow.  Return to the emergency department with any new or suddenly worsening symptoms.

## 2018-10-01 ENCOUNTER — Other Ambulatory Visit (INDEPENDENT_AMBULATORY_CARE_PROVIDER_SITE_OTHER): Payer: Self-pay | Admitting: *Deleted

## 2018-10-01 DIAGNOSIS — R1310 Dysphagia, unspecified: Secondary | ICD-10-CM

## 2018-10-04 ENCOUNTER — Ambulatory Visit (HOSPITAL_COMMUNITY)
Admission: RE | Admit: 2018-10-04 | Discharge: 2018-10-04 | Disposition: A | Discharge status: Home / Self Care | Payer: Medicare Other | Source: Ambulatory Visit | Attending: Internal Medicine | Admitting: Internal Medicine

## 2018-10-04 ENCOUNTER — Other Ambulatory Visit (INDEPENDENT_AMBULATORY_CARE_PROVIDER_SITE_OTHER): Payer: Self-pay | Admitting: Internal Medicine

## 2018-10-04 ENCOUNTER — Other Ambulatory Visit: Payer: Self-pay

## 2018-10-04 DIAGNOSIS — R1310 Dysphagia, unspecified: Secondary | ICD-10-CM

## 2018-10-04 DIAGNOSIS — K219 Gastro-esophageal reflux disease without esophagitis: Secondary | ICD-10-CM | POA: Diagnosis not present

## 2018-10-08 ENCOUNTER — Ambulatory Visit: Payer: Medicare Other | Admitting: Family Medicine

## 2018-10-13 ENCOUNTER — Other Ambulatory Visit: Payer: Self-pay | Admitting: Family Medicine

## 2018-10-13 DIAGNOSIS — I10 Essential (primary) hypertension: Secondary | ICD-10-CM

## 2018-10-16 NOTE — Telephone Encounter (Signed)
Pt was called and she thinks she has enough medications to last until appt on 07/13. She will call back if not. Pt reminded of appt on 07/13. Pt verbalized understanding

## 2018-10-22 ENCOUNTER — Other Ambulatory Visit: Payer: Self-pay

## 2018-10-22 ENCOUNTER — Encounter: Payer: Self-pay | Admitting: Family Medicine

## 2018-10-22 ENCOUNTER — Ambulatory Visit (INDEPENDENT_AMBULATORY_CARE_PROVIDER_SITE_OTHER): Payer: Medicare Other | Admitting: Family Medicine

## 2018-10-22 VITALS — BP 125/78 | HR 75 | Temp 98.1°F | Ht 68.0 in | Wt 184.0 lb

## 2018-10-22 DIAGNOSIS — K219 Gastro-esophageal reflux disease without esophagitis: Secondary | ICD-10-CM | POA: Diagnosis not present

## 2018-10-22 DIAGNOSIS — M25561 Pain in right knee: Secondary | ICD-10-CM | POA: Diagnosis not present

## 2018-10-22 DIAGNOSIS — E039 Hypothyroidism, unspecified: Secondary | ICD-10-CM | POA: Diagnosis not present

## 2018-10-22 DIAGNOSIS — K449 Diaphragmatic hernia without obstruction or gangrene: Secondary | ICD-10-CM

## 2018-10-22 DIAGNOSIS — G8929 Other chronic pain: Secondary | ICD-10-CM

## 2018-10-22 DIAGNOSIS — E663 Overweight: Secondary | ICD-10-CM | POA: Insufficient documentation

## 2018-10-22 DIAGNOSIS — I10 Essential (primary) hypertension: Secondary | ICD-10-CM | POA: Diagnosis not present

## 2018-10-22 DIAGNOSIS — F419 Anxiety disorder, unspecified: Secondary | ICD-10-CM | POA: Diagnosis not present

## 2018-10-22 DIAGNOSIS — M25562 Pain in left knee: Secondary | ICD-10-CM

## 2018-10-22 MED ORDER — LOSARTAN POTASSIUM 100 MG PO TABS: 100.0000 mg | ORAL_TABLET | Freq: Every day | ORAL | 1 refills | Status: DC

## 2018-10-22 MED ORDER — SERTRALINE HCL 100 MG PO TABS: 100.0000 mg | ORAL_TABLET | Freq: Every day | ORAL | 1 refills | Status: DC

## 2018-10-22 MED ORDER — LEVOTHYROXINE SODIUM 50 MCG PO TABS: 50.0000 ug | ORAL_TABLET | Freq: Every day | ORAL | 3 refills | Status: DC

## 2018-10-22 MED ORDER — AMLODIPINE BESYLATE 2.5 MG PO TABS: 2.5000 mg | ORAL_TABLET | Freq: Two times a day (BID) | ORAL | 1 refills | Status: DC

## 2018-10-22 NOTE — Progress Notes (Addendum)
VIRTUAL VISIT VIA VIDEO- fail  I connected with Debra Sanchez on 10/22/18 at  2:00 PM EDT by a video enabled telemedicine application and verified that I am speaking with the correct person using two identifiers. Location patient: Home Location provider: [facility], Office Persons participating in the virtual visit: Patient, Dr. Raoul Pitch and R.Baker, LPN  I discussed the limitations of evaluation and management by telemedicine and the availability of in person appointments. The patient expressed understanding and agreed to proceed.  Interactive audio and video telecommunications were attempted between this provider and patient, however failed, due to patient having technical difficulties OR patient did not have access to video capability. We continued and completed visit with audio only.    SUBJECTIVE Chief Complaint  Patient presents with  . Hypertension    Pt checks BP at home every once in a while. Pt needs refills. Pt is getting upset stomach when she takes her Losartan.   Marland Kitchen Hypothyroidism  . Arthritis    Pt is having trouble going down stairs. She takes Tylenol but it does not seem to be working anymore.     HPI:  Hypothyroidism, unspecified type Reports  compliance with levothyroxine 50 mcg daily. TSH 2.53 01/29/2018.  Essential hypertension Pt reports  compliance withCozaar 100 mg and amlodipine 2.5 g twice a day. Blood pressures ranges at [facility] reported as normal. Patient denies chest pain, shortness of breath, dizziness or lower extremity edema. She reports the losartan can upset her stomach.  . Ptdoes not take adaily baby ASA.  BMP:09/27/2018 within normal limits CBC:09/27/2018 hemoglobin 11.1/hematocrit 35.6 Lipids: 01/29/2018 total cholesterol 212, LDL 138, HDL 46, triglycerides 137. Diet:Watches her diet closely Exercise:Exercises routinely JJ:[id number], obesity, family history heart disease  Anxiety/Insomnia, unspecified type: Patient  reports compliance with Zoloft 100 mg daily.  She reports she did have a little increased anxiety in February, she saw another provider for this.  She states she thinks it was due to medication she had been prescribed.  And she is now back to taking her Zoloft 100 mg daily without problems.  GERD: taking Nexium daily, and well controlled as long as taking medication.    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 She state her left knee is more painful than her right. The pain is worsened with climbing stairs. She feels the left knee is going to "give out" on her at times. She is unable to tolerate any NSAIDS, secondary to severe gerd.   She is wondering if there is a topical solution that can be applied to her knee.  She declines referral.  ROS: See pertinent positives and negatives per HPI.  Patient Active Problem List   Diagnosis Date Noted  . Anxiety 10/22/2018  . Overweight (BMI 25.0-29.9) 10/22/2018  . Chronic pain of both knees 01/29/2018  . Persistent headaches 11/18/2016  . Hiatal hernia with GERD 06/17/2016  . Status post laparoscopic Nissen fundoplication 85/63/1497  . Pulmonary nodules 05/12/2015  . Insomnia 04/20/2015  . Hypertension 05/15/2012  . Hypothyroidism 05/15/2012  . History of colonic polyps 05/15/2012  . Anemia 05/15/2012  . Heme positive stool 05/15/2012    Social History   Tobacco Use  . Smoking status: Never Smoker  . Smokeless tobacco: Never Used  Substance Use Topics  . Alcohol use: No    Alcohol/week: 0.0 standard drinks    Current Outpatient Medications:  .  acetaminophen (TYLENOL) 325 MG tablet, Take 325 mg by mouth every 6 (six)  hours as needed for mild pain (For pain.). , Disp: , Rfl:  .  amLODipine (NORVASC) 2.5 MG tablet, Take 1 tablet (2.5 mg total) by mouth 2 (two) times daily., Disp: 180 tablet, Rfl: 1 .  esomeprazole (NEXIUM) 40 MG capsule, Take 1 capsule (40 mg total) by mouth daily at 12 noon., Disp:  90 capsule, Rfl: 4 .  levothyroxine (SYNTHROID) 50 MCG tablet, Take 1 tablet (50 mcg total) by mouth daily., Disp: 90 tablet, Rfl: 3 .  losartan (COZAAR) 100 MG tablet, Take 1 tablet (100 mg total) by mouth daily., Disp: 90 tablet, Rfl: 1 .  sertraline (ZOLOFT) 100 MG tablet, Take 1 tablet (100 mg total) by mouth at bedtime., Disp: 90 tablet, Rfl: 1 .  SUMAtriptan (IMITREX) 100 MG tablet, TAKE 1 TABLET BY MOUTH AS  NEEDED FOR MIGRAINE. MAY  REPEAT IN 2 HOURS IF  HEADACHE PERSISTS OR  RECURS., Disp: 10 tablet, Rfl: 1  Allergies  Allergen Reactions  . Hydromorphone Itching, Nausea Only and Other (See Comments)    After IV dilaudid pt flushed and felt dizzy  . Ciprofloxacin Other (See Comments)    Stomach problems  . Codeine Itching  . Latex Other (See Comments)    fainted  . Morphine And Related Itching  . Nsaids Nausea And Vomiting  . Penicillins Itching and Nausea And Vomiting    Has patient had a PCN reaction causing immediate rash, facial/tongue/throat swelling, SOB or lightheadedness with hypotension: No Has patient had a PCN reaction causing severe rash involving mucus membranes or skin necrosis: No Has patient had a PCN reaction that required hospitalization No Has patient had a PCN reaction occurring within the last 10 years: Yes If all of the above answers are "NO", then may proceed with Cephalosporin use.   . Sulfa Antibiotics Nausea And Vomiting    OBJECTIVE: BP 125/78   Pulse 75   Temp 98.1 F (36.7 C) (Oral)   Ht 5\' 8"  (1.727 m)   Wt 184 lb (83.5 kg)   BMI 27.98 kg/m  Gen: No acute distress. Nontoxic  CV: no edema reported Chest: Cough or shortness of breath not present,.  Neuro:  Alert. Oriented x3  Psych: Normal affect, dress and demeanor. Normal speech. Normal thought content and judgment.  ASSESSMENT AND PLAN: Debra Sanchez is a 78 y.o. female present for  Anxiety - stable. Refill son zoloft 100 mg QD - sertraline (ZOLOFT) 100 MG tablet; Take 1  tablet (100 mg total) by mouth at bedtime.  Dispense: 90 tablet; Refill: 1  Essential hypertension Stable. Continue Losartan 100 mg Qd and amlodipine 2.5 BID - labs UTD - losartan (COZAAR) 100 MG tablet; Take 1 tablet (100 mg total) by mouth daily.  Dispense: 90 tablet; Refill: 1 - amLODipine (NORVASC) 2.5 MG tablet; Take 1 tablet (2.5 mg total) by mouth 2 (two) times daily.  Dispense: 180 tablet; Refill: 1  Hypothyroidism, unspecified type Labs UTD - refill on levothyroxine 50 mcg daily  Chronic pain of both knees Discussed Voltaren gel, she then decided to try OTC Aspercreme or capsaicin encouraged. -All NSAIDs cause upset stomach  Hiatal hernia with GERD -Controlled on PPI which is supplied by GI    > 25 minutes spent with patient   Howard Pouch, DO 10/22/2018

## 2018-11-20 DIAGNOSIS — H04123 Dry eye syndrome of bilateral lacrimal glands: Secondary | ICD-10-CM | POA: Diagnosis not present

## 2018-12-18 ENCOUNTER — Other Ambulatory Visit: Payer: Self-pay

## 2018-12-18 ENCOUNTER — Encounter (INDEPENDENT_AMBULATORY_CARE_PROVIDER_SITE_OTHER): Payer: Self-pay | Admitting: Internal Medicine

## 2018-12-18 ENCOUNTER — Ambulatory Visit (INDEPENDENT_AMBULATORY_CARE_PROVIDER_SITE_OTHER): Payer: Medicare Other | Admitting: Internal Medicine

## 2018-12-18 VITALS — BP 133/77 | HR 75 | Temp 98.6°F | Ht 68.0 in | Wt 190.2 lb

## 2018-12-18 DIAGNOSIS — K219 Gastro-esophageal reflux disease without esophagitis: Secondary | ICD-10-CM | POA: Diagnosis not present

## 2018-12-18 DIAGNOSIS — Z86010 Personal history of colonic polyps: Secondary | ICD-10-CM | POA: Diagnosis not present

## 2018-12-18 MED ORDER — FAMOTIDINE 40 MG PO TABS: 40.0000 mg | ORAL_TABLET | Freq: Every day | ORAL | 1 refills | Status: DC

## 2018-12-18 MED ORDER — PANTOPRAZOLE SODIUM 40 MG PO TBEC: 40.0000 mg | DELAYED_RELEASE_TABLET | Freq: Every day | ORAL | 3 refills | Status: DC

## 2018-12-18 NOTE — Progress Notes (Signed)
Presenting complaint;  Follow-up for chronic GERD.  Database and subjective:  Patient is 78 year old Caucasian female who has chronic GERD status post failed antireflux twice who has been on various PPIs who is here for scheduled visit.  She was last seen in July 2019.  She says she took domperidone but had some side effects and stop the medication.  She does not remember any more details. She states she does well during the daytime she continues to have regurgitation and heartburn at night usually between 1 and 2 AM.  She says this occurs in spells.  She will have it every night for few days then she can go weeks and not have it.  She says when she has these symptoms she gets nauseated and starts vomiting.  About 2 months ago she woke up with burning chest pain.  She was concerned it was her heart.  She was seen in emergency room and treated and discharged.  She denies dysphagia.  Her appetite is good.  She is watching her diet and trying to lose some weight.  She walks some every day.  She has lost 8 pounds since her last visit.  Her bowels move daily.  She denies melena or rectal bleeding.  Only time she sees blood on the tissue is when she has flareup of her hemorrhoids. She had normal bone density study in December 2013. She states she should have never undergone antireflux surgery. Her last colonoscopy was in 2012.  She has history of colonic adenoma.  Family history is positive of CRC in mother late onset.     Current Medications: Outpatient Encounter Medications as of 12/18/2018  Medication Sig  . acetaminophen (TYLENOL) 325 MG tablet Take 325 mg by mouth every 6 (six) hours as needed for mild pain (For pain.).   Marland Kitchen amLODipine (NORVASC) 2.5 MG tablet Take 1 tablet (2.5 mg total) by mouth 2 (two) times daily.  Marland Kitchen levothyroxine (SYNTHROID) 50 MCG tablet Take 1 tablet (50 mcg total) by mouth daily.  Marland Kitchen losartan (COZAAR) 100 MG tablet Take 1 tablet (100 mg total) by mouth daily.  . pantoprazole  (PROTONIX) 40 MG tablet Take 40 mg by mouth 2 (two) times daily.  . sertraline (ZOLOFT) 100 MG tablet Take 1 tablet (100 mg total) by mouth at bedtime.  . SUMAtriptan (IMITREX) 100 MG tablet TAKE 1 TABLET BY MOUTH AS  NEEDED FOR MIGRAINE. MAY  REPEAT IN 2 HOURS IF  HEADACHE PERSISTS OR  RECURS.  . [DISCONTINUED] esomeprazole (NEXIUM) 40 MG capsule Take 1 capsule (40 mg total) by mouth daily at 12 noon. (Patient not taking: Reported on 12/18/2018)   No facility-administered encounter medications on file as of 12/18/2018.      Objective: Blood pressure 133/77, pulse 75, temperature 98.6 F (37 C), temperature source Oral, height 5\' 8"  (1.727 m), weight 190 lb 3.2 oz (86.3 kg). Patient is alert and in no acute distress. Patient is wearing facial mask. Conjunctiva is pink. Sclera is nonicteric Oropharyngeal mucosa is normal. No neck masses or thyromegaly noted. Cardiac exam with regular rhythm normal S1 and S2. No murmur or gallop noted. Lungs are clear to auscultation. Abdomen is full and symmetrical with laparoscopy scars across upper abdomen.  Abdomen is soft and nontender with organomegaly or masses. No LE edema or clubbing noted.   Assessment:  #1.  Chronic GERD.  She underwent antireflux surgery twice and unfortunately it failed each time.  She has moderate size sliding hiatal hernia.  Symptom control is  still not satisfactory particularly for night symptoms.  She may benefit from H2 B rather than PPI at bedtime.  #2.  History of colonic adenoma and family history of CRC in mother late onset.  She is overdue for surveillance colonoscopy.   Plan:  Continue pantoprazole 40 mg by mouth 30 minutes before breakfast.  Do not take p.m. dose of pantoprazole. Take famotidine 40 mg p.o. nightly. Colonoscopy written COVID-19 pandemic over. Patient will call with progress report in 4 to 6 weeks. Office visit in 6 months.

## 2018-12-18 NOTE — Patient Instructions (Addendum)
Take pantoprazole 30 to 60 minutes before breakfast daily. Take Pepcid/famotidine 40 mg by mouth daily at bedtime. Please keep symptom diary as discussed and call with progress report in 4 to 6 weeks. Consider colonoscopy once COVID- 19 pandemic over.

## 2019-01-25 DIAGNOSIS — N3001 Acute cystitis with hematuria: Secondary | ICD-10-CM | POA: Diagnosis not present

## 2019-01-25 DIAGNOSIS — R300 Dysuria: Secondary | ICD-10-CM | POA: Diagnosis not present

## 2019-03-12 DIAGNOSIS — U071 COVID-19: Secondary | ICD-10-CM

## 2019-03-12 HISTORY — DX: COVID-19: U07.1

## 2019-03-18 ENCOUNTER — Telehealth: Payer: Self-pay | Admitting: Family Medicine

## 2019-03-18 ENCOUNTER — Encounter: Payer: Self-pay | Admitting: Family Medicine

## 2019-03-18 ENCOUNTER — Other Ambulatory Visit: Payer: Self-pay

## 2019-03-18 ENCOUNTER — Ambulatory Visit (INDEPENDENT_AMBULATORY_CARE_PROVIDER_SITE_OTHER): Payer: Medicare Other | Admitting: Family Medicine

## 2019-03-18 VITALS — BP 132/82 | HR 76 | Temp 98.5°F | Resp 17 | Ht 68.0 in | Wt 191.0 lb

## 2019-03-18 DIAGNOSIS — Z23 Encounter for immunization: Secondary | ICD-10-CM

## 2019-03-18 DIAGNOSIS — E039 Hypothyroidism, unspecified: Secondary | ICD-10-CM | POA: Diagnosis not present

## 2019-03-18 DIAGNOSIS — K219 Gastro-esophageal reflux disease without esophagitis: Secondary | ICD-10-CM | POA: Diagnosis not present

## 2019-03-18 DIAGNOSIS — I10 Essential (primary) hypertension: Secondary | ICD-10-CM

## 2019-03-18 DIAGNOSIS — G4700 Insomnia, unspecified: Secondary | ICD-10-CM

## 2019-03-18 DIAGNOSIS — F419 Anxiety disorder, unspecified: Secondary | ICD-10-CM

## 2019-03-18 DIAGNOSIS — E663 Overweight: Secondary | ICD-10-CM

## 2019-03-18 DIAGNOSIS — E782 Mixed hyperlipidemia: Secondary | ICD-10-CM

## 2019-03-18 LAB — LIPID PANEL
Cholesterol: 227 mg/dL — ABNORMAL HIGH (ref 0–200)
HDL: 50.2 mg/dL (ref >39.00)
LDL Cholesterol: 149 mg/dL — ABNORMAL HIGH (ref 0–99)
NonHDL: 176.67
Total CHOL/HDL Ratio: 5
Triglycerides: 139 mg/dL (ref 0.0–149.0)
VLDL: 27.8 mg/dL (ref 0.0–40.0)

## 2019-03-18 LAB — TSH: TSH: 1.54 u[IU]/mL (ref 0.35–4.50)

## 2019-03-18 MED ORDER — LOSARTAN POTASSIUM 100 MG PO TABS: 100.0000 mg | ORAL_TABLET | Freq: Every day | ORAL | 1 refills | Status: DC

## 2019-03-18 MED ORDER — AMLODIPINE BESYLATE 2.5 MG PO TABS: 2.5000 mg | ORAL_TABLET | Freq: Two times a day (BID) | ORAL | 1 refills | Status: DC

## 2019-03-18 MED ORDER — SERTRALINE HCL 100 MG PO TABS: 100.0000 mg | ORAL_TABLET | Freq: Every day | ORAL | 1 refills | Status: DC

## 2019-03-18 MED ORDER — LEVOTHYROXINE SODIUM 50 MCG PO TABS: 50.0000 ug | ORAL_TABLET | Freq: Every day | ORAL | 3 refills | Status: DC

## 2019-03-18 NOTE — Telephone Encounter (Signed)
Please inform patient the following information: Her thyroid is functioning normally.  I have refilled her medicines for her. Her cholesterol levels are elevated.  They are increased from last year.  Last year she did decline a statin.  However her cholesterol levels do put her at higher cardiovascular risk for risk of experiencing a heart attack or stroke.  Starting a low-dose cholesterol medication can help lower her cholesterol and provide her with the added cardiovascular protection against heart attack and stroke.  She is agreeable to start this medication I will be happy to call it in for her.

## 2019-03-18 NOTE — Patient Instructions (Addendum)
Try Aspercreme or capsicin cream for your knees.  Tumeric and cherry tart supplements can also be helpful.   We will call you with your lab results once we get them.  I refilled your medications today.   Great to see you today.   Follow in 6 months.

## 2019-03-18 NOTE — Progress Notes (Signed)
This visit occurred during the SARS-CoV-2 public health emergency.  Safety protocols were in place, including screening questions prior to the visit, additional usage of staff PPE, and extensive cleaning of exam room while observing appropriate contact time as indicated for disinfecting solutions.    SUBJECTIVE Chief Complaint  Patient presents with  . Hypertension    Pt is doing well with no complaints. Requested copy of Shingles vaccine from [facility]. Pt has had #1 of 2.   . Anxiety    HPI: Debra Sanchez is a 78 y.o. female present for [facility] Hypothyroidism, unspecified type Reports  compliance with levothyroxine 50 mcg daily. TSH 2.53 01/29/2018.  Essential hypertension Pt reports  compliance withCozaar 100 mg and amlodipine 2.5 g twice a day. Blood pressures ranges at [facility] reported as normal.Patient denies chest pain, shortness of breath, dizziness or lower extremity edema. Ptdoes not take adaily baby ASA.  BMP:09/27/2018 within normal limits CBC:09/27/2018 hemoglobin 11.1/hematocrit 35.6 Lipids: 01/29/2018 total cholesterol 212, LDL 138, HDL 46, triglycerides 137. Diet:Watches her diet closely Exercise:Exercises routinely [id number], obesity, family history heart disease  Anxiety/Insomnia, unspecified type: Patient reports compliance with Zoloft 100 mg daily.  She states she is doing well on her med.   GERD: pt reports doing well on  Nexium daily, and well controlled as long as taking medication.  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 She state her left knee is more painful than her right. The pain is worsened with climbing stairs. She feels the left knee is going to "give out" on her at times. She is unable to tolerate any NSAIDS, secondary to severe gerd.   She forgot to start the Aspercreme or capsaicin discuss last time. She does not desire referral.   Depression screen [facility] 2/9  03/18/2019 10/22/2018 01/29/2018 01/16/2017 07/18/2016  Decreased Interest 0 0 0 0 0  Down, Depressed, Hopeless 0 0 0 0 0  PHQ - 2 Score 0 0 0 0 0  Altered sleeping - - 1 - -  Tired, decreased energy - - 1 - -  Change in appetite - - 0 - -  Feeling bad or failure about yourself  - - 0 - -  Trouble concentrating - - 0 - -  Moving slowly or fidgety/restless - - 0 - -  Suicidal thoughts - - 0 - -  PHQ-9 Score - - 2 - -   GAD 7 : Generalized Anxiety Score 03/18/2019  Nervous, Anxious, on Edge 0  Control/stop worrying 0  Worry too much - different things 0  Trouble relaxing 0  Restless 0  Easily annoyed or irritable 0  Afraid - awful might happen 0  Total GAD 7 Score 0  Anxiety Difficulty Not difficult at all    ROS: See pertinent positives and negatives per HPI.  Patient Active Problem List   Diagnosis Date Noted  . GERD (gastroesophageal reflux disease) 12/18/2018  . Anxiety 10/22/2018  . Overweight (BMI 25.0-29.9) 10/22/2018  . Chronic pain of both knees 01/29/2018  . Persistent headaches 11/18/2016  . Hiatal hernia with GERD 06/17/2016  . Status post laparoscopic Nissen fundoplication [id number]  . Pulmonary nodules 05/12/2015  . Insomnia 04/20/2015  . Hypertension 05/15/2012  . Hypothyroidism 05/15/2012  . History of colonic polyps 05/15/2012  . Anemia 05/15/2012  . Heme positive stool 05/15/2012    Social History   Tobacco Use  . Smoking status: Never Smoker  . Smokeless tobacco: Never  Used  Substance Use Topics  . Alcohol use: No    Alcohol/week: 0.0 standard drinks    Current Outpatient Medications:  .  acetaminophen (TYLENOL) 325 MG tablet, Take 325 mg by mouth every 6 (six) hours as needed for mild pain (For pain.). , Disp: , Rfl:  .  amLODipine (NORVASC) 2.5 MG tablet, Take 1 tablet (2.5 mg total) by mouth 2 (two) times daily., Disp: 180 tablet, Rfl: 1 .  famotidine (PEPCID) 40 MG tablet, Take 1 tablet (40 mg total) by mouth at bedtime., Disp: 30 tablet, Rfl:  1 .  levothyroxine (SYNTHROID) 50 MCG tablet, Take 1 tablet (50 mcg total) by mouth daily., Disp: 90 tablet, Rfl: 3 .  losartan (COZAAR) 100 MG tablet, Take 1 tablet (100 mg total) by mouth daily., Disp: 90 tablet, Rfl: 1 .  pantoprazole (PROTONIX) 40 MG tablet, Take 1 tablet (40 mg total) by mouth daily before breakfast., Disp: 90 tablet, Rfl: 3 .  sertraline (ZOLOFT) 100 MG tablet, Take 1 tablet (100 mg total) by mouth at bedtime., Disp: 90 tablet, Rfl: 1 .  SUMAtriptan (IMITREX) 100 MG tablet, TAKE 1 TABLET BY MOUTH AS  NEEDED FOR MIGRAINE. MAY  REPEAT IN 2 HOURS IF  HEADACHE PERSISTS OR  RECURS., Disp: 10 tablet, Rfl: 1  Allergies  Allergen Reactions  . Hydromorphone Itching, Nausea Only and Other (See Comments)    After IV dilaudid pt flushed and felt dizzy  . Ciprofloxacin Other (See Comments)    Stomach problems  . Codeine Itching  . Latex Other (See Comments)    fainted  . Morphine And Related Itching  . Nsaids Nausea And Vomiting  . Penicillins Itching and Nausea And Vomiting    Has patient had a PCN reaction causing immediate rash, facial/tongue/throat swelling, SOB or lightheadedness with hypotension: No Has patient had a PCN reaction causing severe rash involving mucus membranes or skin necrosis: No Has patient had a PCN reaction that required hospitalization No Has patient had a PCN reaction occurring within the last 10 years: Yes If all of the above answers are "NO", then may proceed with Cephalosporin use.   . Sulfa Antibiotics Nausea And Vomiting    OBJECTIVE: BP 132/82 (BP Location: Right Arm, Patient Position: Sitting, Cuff Size: Normal)   Pulse 76   Temp 98.5 F (36.9 C)   Resp 17   Ht 5\' 8"  (1.727 m)   Wt 191 lb (86.6 kg)   SpO2 98%   BMI 29.04 kg/m  Gen: Afebrile. No acute distress. Nontoxic in appearance.  HENT: AT. [place].  Eyes:Pupils Equal Round Reactive to light, Extraocular movements intact,  Conjunctiva without redness, discharge or icterus.  Neck/lymp/endocrine: Supple,no lymphadenopathy, no thyromegaly CV: RRR no murmur, no edema, +2/4 P posterior tibialis pulses Chest: CTAB, no wheeze or crackles Abd: Soft. NTND. BS present. .  Neuro:  Normal gait. PERLA. EOMi. Alert. Oriented x3  Psych: Normal affect, dress and demeanor. Normal speech. Normal thought content and judgment.    ASSESSMENT AND PLAN: Debra Sanchez is a 78 y.o. female present for  Anxiety - stable. Refills on zoloft 100 mg QD - sertraline (ZOLOFT) 100 MG tablet; Take 1 tablet (100 mg total) by mouth at bedtime.  Dispense: 90 tablet; Refill: 1  Essential hypertension Stable. Continue Losartan 100 mg Qd and amlodipine 2.5 BID - TSH and Lipid collected  - losartan (COZAAR) 100 MG tablet; Take 1 tablet (100 mg total) by mouth daily.  Dispense: 90 tablet; Refill: 1 - amLODipine (  NORVASC) 2.5 MG tablet; Take 1 tablet (2.5 mg total) by mouth 2 (two) times daily.  Dispense: 180 tablet; Refill: 1  Hypothyroidism, unspecified type  Collected today.  - refill on levothyroxine 50 mcg daily after labs or dose appropriate.   Chronic pain of both knees Discussed Voltaren gel, she then decided to try OTC Aspercreme or capsaicin encouraged. - tumeric and cherry tart extract encouraged for trial.  -All NSAIDs cause upset stomach  Hiatal hernia with GERD -controlled on PPI which is supplied by GI   Prevnar 13 completed today to complete series for her.   F/U 6 mos.   Orders Placed This Encounter  Procedures  . TSH  . Lipid panel      Howard Pouch, DO 03/18/2019

## 2019-03-19 DIAGNOSIS — E782 Mixed hyperlipidemia: Secondary | ICD-10-CM | POA: Insufficient documentation

## 2019-03-19 MED ORDER — ATORVASTATIN CALCIUM 20 MG PO TABS: 20.0000 mg | ORAL_TABLET | Freq: Every day | ORAL | 3 refills | Status: DC

## 2019-03-19 NOTE — Addendum Note (Signed)
Addended by: Howard Pouch A on: 03/19/2019 11:58 AM   Modules accepted: Orders

## 2019-03-19 NOTE — Telephone Encounter (Signed)
Patient agreeable to begin low dose cholesterol medication.

## 2019-03-19 NOTE — Telephone Encounter (Signed)
Left detailed message, okay per DPR  

## 2019-03-19 NOTE — Telephone Encounter (Signed)
Lipitor (atorvastatin) 20 mg daily prescribed and called into her mail-in pharmacy.  We will recheck her cholesterol and liver function test on her chronic medical condition follow-up appointment in 5-6 months.

## 2019-04-08 ENCOUNTER — Other Ambulatory Visit: Payer: Self-pay

## 2019-04-08 ENCOUNTER — Ambulatory Visit: Payer: Medicare Other | Attending: Internal Medicine

## 2019-04-08 DIAGNOSIS — Z20822 Contact with and (suspected) exposure to covid-19: Secondary | ICD-10-CM

## 2019-04-09 LAB — NOVEL CORONAVIRUS, NAA: SARS-CoV-2, NAA: DETECTED — AB

## 2019-04-10 ENCOUNTER — Other Ambulatory Visit: Payer: Self-pay | Admitting: Nurse Practitioner

## 2019-04-10 DIAGNOSIS — U071 COVID-19: Secondary | ICD-10-CM

## 2019-04-10 DIAGNOSIS — I10 Essential (primary) hypertension: Secondary | ICD-10-CM

## 2019-04-10 NOTE — Progress Notes (Signed)
  I connected by phone with Angus Palms on 04/10/2019 at 10:22 AM to discuss the potential use of an new treatment for mild to moderate COVID-19 viral infection in non-hospitalized patients.  This patient is a 78 y.o. female that meets the FDA criteria for Emergency Use Authorization of bamlanivimab or casirivimab\imdevimab.  Has a (+) direct SARS-CoV-2 viral test result  Has mild or moderate COVID-19   Is ? 78 years of age and weighs ? 40 kg  Is NOT hospitalized due to COVID-19  Is NOT requiring oxygen therapy or requiring an increase in baseline oxygen flow rate due to COVID-19  Is within 10 days of symptom onset  Has at least one of the high risk factor(s) for progression to severe COVID-19 and/or hospitalization as defined in EUA.  Specific high risk criteria : Hypertension   I have spoken and communicated the following to the patient or parent/caregiver:  1. FDA has authorized the emergency use of bamlanivimab and casirivimab\imdevimab for the treatment of mild to moderate COVID-19 in adults and pediatric patients with positive results of direct SARS-CoV-2 viral testing who are 21 years of age and older weighing at least 40 kg, and who are at high risk for progressing to severe COVID-19 and/or hospitalization.  2. The significant known and potential risks and benefits of bamlanivimab and casirivimab\imdevimab, and the extent to which such potential risks and benefits are unknown.  3. Information on available alternative treatments and the risks and benefits of those alternatives, including clinical trials.  4. Patients treated with bamlanivimab and casirivimab\imdevimab should continue to self-isolate and use infection control measures (e.g., wear mask, isolate, social distance, avoid sharing personal items, clean and disinfect "high touch" surfaces, and frequent handwashing) according to CDC guidelines.   5. The patient or parent/caregiver has the option to accept or  refuse bamlanivimab or casirivimab\imdevimab .  After reviewing this information with the patient, The patient agreed to proceed with receiving the bamlanimivab infusion and will be provided a copy of the Fact sheet prior to receiving the infusion.Fenton Foy 04/10/2019 10:22 AM

## 2019-04-11 ENCOUNTER — Ambulatory Visit (HOSPITAL_COMMUNITY)
Admission: RE | Admit: 2019-04-11 | Discharge: 2019-04-11 | Disposition: A | Discharge status: Home / Self Care | Payer: Medicare Other | Source: Ambulatory Visit | Attending: Pulmonary Disease | Admitting: Pulmonary Disease

## 2019-04-11 DIAGNOSIS — U071 COVID-19: Secondary | ICD-10-CM | POA: Insufficient documentation

## 2019-04-11 DIAGNOSIS — Z23 Encounter for immunization: Secondary | ICD-10-CM | POA: Diagnosis not present

## 2019-04-11 DIAGNOSIS — I10 Essential (primary) hypertension: Secondary | ICD-10-CM | POA: Diagnosis present

## 2019-04-11 MED ORDER — SODIUM CHLORIDE 0.9 % IV SOLN
INTRAVENOUS | Status: DC | PRN
  Administered 2019-04-11: 250 mL via INTRAVENOUS

## 2019-04-11 MED ORDER — SODIUM CHLORIDE 0.9 % IV SOLN
700.0000 mg | Freq: Once | INTRAVENOUS | Status: AC
  Administered 2019-04-11: 700 mg via INTRAVENOUS
  Filled 2019-04-11: qty 20

## 2019-04-11 MED ORDER — FAMOTIDINE IN NACL 20-0.9 MG/50ML-% IV SOLN: 20.0000 mg | Freq: Once | INTRAVENOUS | Status: DC | PRN

## 2019-04-11 MED ORDER — METHYLPREDNISOLONE SODIUM SUCC 125 MG IJ SOLR: 125.0000 mg | Freq: Once | INTRAMUSCULAR | Status: DC | PRN

## 2019-04-11 MED ORDER — EPINEPHRINE 0.3 MG/0.3ML IJ SOAJ: 0.3000 mg | Freq: Once | INTRAMUSCULAR | Status: DC | PRN

## 2019-04-11 MED ORDER — ALBUTEROL SULFATE HFA 108 (90 BASE) MCG/ACT IN AERS: 2.0000 | INHALATION_SPRAY | Freq: Once | RESPIRATORY_TRACT | Status: DC | PRN

## 2019-04-11 MED ORDER — DIPHENHYDRAMINE HCL 50 MG/ML IJ SOLN: 50.0000 mg | Freq: Once | INTRAMUSCULAR | Status: DC | PRN

## 2019-04-11 NOTE — Progress Notes (Signed)
  Diagnosis: COVID-19  Physician: Ma Hillock, DO  Procedure: [facility] Infusion [facility] Med: bamlanivimab infusion - Provided patient with bamlanimivab fact sheet for patients, parents and caregivers prior to infusion.  Complications: No immediate complications noted.  Discharge: Discharged home   Gaye Alken 04/11/2019

## 2019-04-11 NOTE — Discharge Instructions (Signed)

## 2019-06-18 ENCOUNTER — Ambulatory Visit (INDEPENDENT_AMBULATORY_CARE_PROVIDER_SITE_OTHER): Payer: Medicare Other | Admitting: Internal Medicine

## 2019-06-18 ENCOUNTER — Encounter (INDEPENDENT_AMBULATORY_CARE_PROVIDER_SITE_OTHER): Payer: Self-pay | Admitting: Internal Medicine

## 2019-06-18 ENCOUNTER — Other Ambulatory Visit: Payer: Self-pay

## 2019-06-18 VITALS — BP 133/77 | HR 79 | Temp 97.3°F | Ht 68.0 in | Wt 193.4 lb

## 2019-06-18 DIAGNOSIS — K219 Gastro-esophageal reflux disease without esophagitis: Secondary | ICD-10-CM

## 2019-06-18 MED ORDER — FAMOTIDINE 40 MG PO TABS: 40.0000 mg | ORAL_TABLET | Freq: Every day | ORAL | 3 refills | Status: DC

## 2019-06-18 NOTE — Progress Notes (Signed)
Presenting complaint;  Follow for chronic GERD.  Database and subjective:  Patient is 79 year old Caucasian female who has chronic GERD known hiatal hernia status post surgery x2; failed each time who is here for scheduled visit.  She was last seen in September 2020.  Patient says she feels about the same.  She does well for several days or few weeks and then she has a spell possibly triggered by her food and it may last for couple of days when she has heartburn regurgitation and vomiting.  When this occurs she backs off her meals and eats crackers and foods that she is able to tolerated.  When she has these spells she does not have hematemesis or melena.  She denies dysphagia.  Her bowels move daily.  She denies melena or rectal bleeding.  She says she eats supper between 4 and 5 PM and goes to bed at 10 PM.  She has head and of bed elevated by 6 inches.  If she does not do so she has nocturnal regurgitation.  She she is unable to drink coffee or hot chocolate anymore.  She also has quit eating pork sausage and 6 with fish chicken and [place]. She walks every day and she also rides stationary bike. She is still working.  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  Current Medications: Outpatient Encounter Medications as of 06/18/2019  Medication Sig  . acetaminophen (TYLENOL) 325 MG tablet Take 325 mg by mouth every 6 (six) hours as needed for mild pain (For pain.).   Marland Kitchen amLODipine (NORVASC) 2.5 MG tablet Take 1 tablet (2.5 mg total) by mouth 2 (two) times daily.  Marland Kitchen atorvastatin (LIPITOR) 20 MG tablet Take 1 tablet (20 mg total) by mouth daily.  . famotidine (PEPCID) 40 MG tablet Take 1 tablet (40 mg total) by mouth at bedtime.  Marland Kitchen levothyroxine (SYNTHROID) 50 MCG tablet Take 1 tablet (50 mcg total) by mouth daily.  Marland Kitchen losartan (COZAAR) 100 MG tablet Take 1 tablet (100 mg total) by mouth daily.  Marland Kitchen MELATONIN PO Take 10 mg by mouth at bedtime.  . pantoprazole (PROTONIX) 40 MG  tablet Take 1 tablet (40 mg total) by mouth daily before breakfast.  . sertraline (ZOLOFT) 100 MG tablet Take 1 tablet (100 mg total) by mouth at bedtime.  . SUMAtriptan (IMITREX) 100 MG tablet TAKE 1 TABLET BY MOUTH AS  NEEDED FOR MIGRAINE. MAY  REPEAT IN 2 HOURS IF  HEADACHE PERSISTS OR  RECURS.   No facility-administered encounter medications on file as of 06/18/2019.     Objective: Blood pressure 133/77, pulse 79, temperature (!) 97.3 F (36.3 C), temperature source Temporal, height 5\' 8"  (1.727 m), weight 193 lb 6.4 oz (87.7 kg). Patient is alert and in no acute distress. She is wearing facial mask. Conjunctiva is pink. Sclera is nonicteric Oropharyngeal mucosa is normal. No neck masses or thyromegaly noted. Cardiac exam with regular rhythm normal S1 and S2. No murmur or gallop noted. Lungs are clear to auscultation. Abdomen is full but soft and nontender with organomegaly or masses. No LE edema or clubbing noted.    Assessment:  #1.  Chronic GERD.  She is status post surgery twice and it failed each time.  She has recurrent hiatal hernia.  She is on PPI in the morning and H2 B at night.  Symptom control is fair.  She is still having breakthrough spells.  She has tried other PPIs and cannot tell and  one is better or the other.  Therefore will continue current therapy until symptoms get worse.   Plan:  New prescription for famotidine 40 mg by mouth daily at bedtime sent to patient's pharmacy for 3 months with 3 refills. She will continue pantoprazole 40 mg by mouth 30 minutes before breakfast daily. Patient will call office if she has dysphagia or her symptoms change. Otherwise return for office visit in 1 year.

## 2019-06-18 NOTE — Patient Instructions (Signed)
Try Gaviscon at bedtime.  Chew 2 tablets every night. Please call office if you have swallowing difficulty or there is change in your medications.

## 2019-06-19 DIAGNOSIS — R300 Dysuria: Secondary | ICD-10-CM | POA: Diagnosis not present

## 2019-06-19 DIAGNOSIS — N3001 Acute cystitis with hematuria: Secondary | ICD-10-CM | POA: Diagnosis not present

## 2019-06-19 DIAGNOSIS — R8290 Unspecified abnormal findings in urine: Secondary | ICD-10-CM | POA: Diagnosis not present

## 2019-06-25 DIAGNOSIS — S233XXA Sprain of ligaments of thoracic spine, initial encounter: Secondary | ICD-10-CM | POA: Diagnosis not present

## 2019-06-25 DIAGNOSIS — M47816 Spondylosis without myelopathy or radiculopathy, lumbar region: Secondary | ICD-10-CM | POA: Diagnosis not present

## 2019-06-25 DIAGNOSIS — M9903 Segmental and somatic dysfunction of lumbar region: Secondary | ICD-10-CM | POA: Diagnosis not present

## 2019-07-02 DIAGNOSIS — M47816 Spondylosis without myelopathy or radiculopathy, lumbar region: Secondary | ICD-10-CM | POA: Diagnosis not present

## 2019-07-02 DIAGNOSIS — M9903 Segmental and somatic dysfunction of lumbar region: Secondary | ICD-10-CM | POA: Diagnosis not present

## 2019-07-02 DIAGNOSIS — S233XXA Sprain of ligaments of thoracic spine, initial encounter: Secondary | ICD-10-CM | POA: Diagnosis not present

## 2019-07-17 DIAGNOSIS — M47816 Spondylosis without myelopathy or radiculopathy, lumbar region: Secondary | ICD-10-CM | POA: Diagnosis not present

## 2019-07-17 DIAGNOSIS — M9903 Segmental and somatic dysfunction of lumbar region: Secondary | ICD-10-CM | POA: Diagnosis not present

## 2019-07-17 DIAGNOSIS — S233XXA Sprain of ligaments of thoracic spine, initial encounter: Secondary | ICD-10-CM | POA: Diagnosis not present

## 2019-07-19 DIAGNOSIS — M9903 Segmental and somatic dysfunction of lumbar region: Secondary | ICD-10-CM | POA: Diagnosis not present

## 2019-07-19 DIAGNOSIS — S233XXA Sprain of ligaments of thoracic spine, initial encounter: Secondary | ICD-10-CM | POA: Diagnosis not present

## 2019-07-19 DIAGNOSIS — M47816 Spondylosis without myelopathy or radiculopathy, lumbar region: Secondary | ICD-10-CM | POA: Diagnosis not present

## 2019-07-23 DIAGNOSIS — M9903 Segmental and somatic dysfunction of lumbar region: Secondary | ICD-10-CM | POA: Diagnosis not present

## 2019-07-23 DIAGNOSIS — M47816 Spondylosis without myelopathy or radiculopathy, lumbar region: Secondary | ICD-10-CM | POA: Diagnosis not present

## 2019-07-23 DIAGNOSIS — S233XXA Sprain of ligaments of thoracic spine, initial encounter: Secondary | ICD-10-CM | POA: Diagnosis not present

## 2019-07-25 DIAGNOSIS — M47816 Spondylosis without myelopathy or radiculopathy, lumbar region: Secondary | ICD-10-CM | POA: Diagnosis not present

## 2019-07-25 DIAGNOSIS — S233XXA Sprain of ligaments of thoracic spine, initial encounter: Secondary | ICD-10-CM | POA: Diagnosis not present

## 2019-07-25 DIAGNOSIS — M9903 Segmental and somatic dysfunction of lumbar region: Secondary | ICD-10-CM | POA: Diagnosis not present

## 2019-07-28 ENCOUNTER — Other Ambulatory Visit: Payer: Self-pay | Admitting: Family Medicine

## 2019-07-28 DIAGNOSIS — I10 Essential (primary) hypertension: Secondary | ICD-10-CM

## 2019-09-16 ENCOUNTER — Encounter: Payer: Self-pay | Admitting: Family Medicine

## 2019-09-16 ENCOUNTER — Ambulatory Visit (INDEPENDENT_AMBULATORY_CARE_PROVIDER_SITE_OTHER): Payer: Medicare Other | Admitting: Family Medicine

## 2019-09-16 ENCOUNTER — Other Ambulatory Visit: Payer: Self-pay

## 2019-09-16 VITALS — BP 116/76 | HR 76 | Temp 97.3°F | Resp 17 | Ht 68.0 in | Wt 192.0 lb

## 2019-09-16 DIAGNOSIS — Z79899 Other long term (current) drug therapy: Secondary | ICD-10-CM | POA: Diagnosis not present

## 2019-09-16 DIAGNOSIS — E039 Hypothyroidism, unspecified: Secondary | ICD-10-CM | POA: Diagnosis not present

## 2019-09-16 DIAGNOSIS — I10 Essential (primary) hypertension: Secondary | ICD-10-CM | POA: Diagnosis not present

## 2019-09-16 DIAGNOSIS — E782 Mixed hyperlipidemia: Secondary | ICD-10-CM

## 2019-09-16 DIAGNOSIS — K219 Gastro-esophageal reflux disease without esophagitis: Secondary | ICD-10-CM

## 2019-09-16 DIAGNOSIS — F419 Anxiety disorder, unspecified: Secondary | ICD-10-CM

## 2019-09-16 LAB — HEPATIC FUNCTION PANEL
ALT: 11 U/L (ref 0–35)
AST: 13 U/L (ref 0–37)
Albumin: 4.2 g/dL (ref 3.5–5.2)
Alkaline Phosphatase: 83 U/L (ref 39–117)
Bilirubin, Direct: 0.1 mg/dL (ref 0.0–0.3)
Total Bilirubin: 0.4 mg/dL (ref 0.2–1.2)
Total Protein: 6.3 g/dL (ref 6.0–8.3)

## 2019-09-16 LAB — LIPID PANEL
Cholesterol: 136 mg/dL (ref 0–200)
HDL: 46.3 mg/dL (ref >39.00)
LDL Cholesterol: 66 mg/dL (ref 0–99)
NonHDL: 89.64
Total CHOL/HDL Ratio: 3
Triglycerides: 117 mg/dL (ref 0.0–149.0)
VLDL: 23.4 mg/dL (ref 0.0–40.0)

## 2019-09-16 MED ORDER — LOSARTAN POTASSIUM 100 MG PO TABS: 100.0000 mg | ORAL_TABLET | Freq: Every day | ORAL | 1 refills | Status: DC

## 2019-09-16 MED ORDER — SERTRALINE HCL 100 MG PO TABS: 100.0000 mg | ORAL_TABLET | Freq: Every day | ORAL | 1 refills | Status: DC

## 2019-09-16 MED ORDER — AMLODIPINE BESYLATE 2.5 MG PO TABS: 2.5000 mg | ORAL_TABLET | Freq: Two times a day (BID) | ORAL | 1 refills | Status: DC

## 2019-09-16 NOTE — Progress Notes (Signed)
This visit occurred during the SARS-CoV-2 public health emergency.  Safety protocols were in place, including screening questions prior to the visit, additional usage of staff PPE, and extensive cleaning of exam room while observing appropriate contact time as indicated for disinfecting solutions.    SUBJECTIVE Chief Complaint  Patient presents with  . Anxiety    Needs refills. Pt takes BP at home randomly, WNL per pt   . Hypertension    HPI: Debra Sanchez is a 79 y.o. female present for [facility] Hypothyroidism, unspecified type Reports compliance with levothyroxine 50 mcg daily.  Essential hypertension Pt reports  compliance withCozaar 100 mg and amlodipine 2.5 g twice a day. Blood pressures ranges at homeare reported WNL.Patient denies chest pain, shortness of breath, dizziness or lower extremity edema.   Ptdoes not take adaily baby ASA. She is tolerating start of a statin.  Labs UTD Diet:Watches her diet closely Exercise:Exercises routinely OZ:[id number], obesity, family history heart disease  Anxiety/Insomnia, unspecified type: Patient reports complaince with Zoloft 100 mg daily. She reports she is doing well.    Depression screen [facility] 2/9 09/16/2019 03/18/2019 10/22/2018 01/29/2018 01/16/2017  Decreased Interest 0 0 0 0 0  Down, Depressed, Hopeless 0 0 0 0 0  PHQ - 2 Score 0 0 0 0 0  Altered sleeping - - - 1 -  Tired, decreased energy - - - 1 -  Change in appetite - - - 0 -  Feeling bad or failure about yourself  - - - 0 -  Trouble concentrating - - - 0 -  Moving slowly or fidgety/restless - - - 0 -  Suicidal thoughts - - - 0 -  PHQ-9 Score - - - 2 -   GAD 7 : Generalized Anxiety Score 09/16/2019 03/18/2019  Nervous, Anxious, on Edge 0 0  Control/stop worrying 0 0  Worry too much - different things 0 0  Trouble relaxing 0 0  Restless 0 0  Easily annoyed or irritable 0 0  Afraid - awful might happen 0 0  Total GAD 7 Score 0 0  Anxiety Difficulty Not  difficult at all Not difficult at all    ROS: See pertinent positives and negatives per HPI.  Patient Active Problem List   Diagnosis Date Noted  . Mixed hyperlipidemia 03/19/2019  . GERD (gastroesophageal reflux disease) 12/18/2018  . Anxiety 10/22/2018  . Overweight (BMI 25.0-29.9) 10/22/2018  . Chronic pain of both knees 01/29/2018  . Persistent headaches 11/18/2016  . Hiatal hernia with GERD 06/17/2016  . Status post laparoscopic Nissen fundoplication 95/63/8756  . Pulmonary nodules 05/12/2015  . Insomnia 04/20/2015  . Hypertension 05/15/2012  . Hypothyroidism 05/15/2012  . History of colonic polyps 05/15/2012  . Anemia 05/15/2012  . Heme positive stool 05/15/2012    Social History   Tobacco Use  . Smoking status: Never Smoker  . Smokeless tobacco: Never Used  Substance Use Topics  . Alcohol use: No    Alcohol/week: 0.0 standard drinks    Current Outpatient Medications:  .  acetaminophen (TYLENOL) 325 MG tablet, Take 325 mg by mouth every 6 (six) hours as needed for mild pain (For pain.). , Disp: , Rfl:  .  amLODipine (NORVASC) 2.5 MG tablet, Take 1 tablet (2.5 mg total) by mouth 2 (two) times daily., Disp: 180 tablet, Rfl: 1 .  atorvastatin (LIPITOR) 20 MG tablet, Take 1 tablet (20 mg total) by mouth daily., Disp: 90 tablet, Rfl: 3 .  famotidine (PEPCID) 40 MG tablet, Take 1  tablet (40 mg total) by mouth at bedtime., Disp: 90 tablet, Rfl: 3 .  levothyroxine (SYNTHROID) 50 MCG tablet, Take 1 tablet (50 mcg total) by mouth daily., Disp: 90 tablet, Rfl: 3 .  losartan (COZAAR) 100 MG tablet, Take 1 tablet (100 mg total) by mouth daily., Disp: 90 tablet, Rfl: 1 .  MELATONIN PO, Take 10 mg by mouth at bedtime., Disp: , Rfl:  .  pantoprazole (PROTONIX) 40 MG tablet, Take 1 tablet (40 mg total) by mouth daily before breakfast., Disp: 90 tablet, Rfl: 3 .  sertraline (ZOLOFT) 100 MG tablet, Take 1 tablet (100 mg total) by mouth at bedtime., Disp: 90 tablet, Rfl: 1 .  SUMAtriptan  (IMITREX) 100 MG tablet, TAKE 1 TABLET BY MOUTH AS  NEEDED FOR MIGRAINE. MAY  REPEAT IN 2 HOURS IF  HEADACHE PERSISTS OR  RECURS., Disp: 10 tablet, Rfl: 1  Allergies  Allergen Reactions  . Hydromorphone Itching, Nausea Only and Other (See Comments)    After IV dilaudid pt flushed and felt dizzy  . Ciprofloxacin Other (See Comments)    Stomach problems  . Codeine Itching  . Latex Other (See Comments)    fainted  . Morphine And Related Itching  . Nsaids Nausea And Vomiting  . Penicillins Itching and Nausea And Vomiting    Has patient had a PCN reaction causing immediate rash, facial/tongue/throat swelling, SOB or lightheadedness with hypotension: No Has patient had a PCN reaction causing severe rash involving mucus membranes or skin necrosis: No Has patient had a PCN reaction that required hospitalization No Has patient had a PCN reaction occurring within the last 10 years: Yes If all of the above answers are "NO", then may proceed with Cephalosporin use.   . Sulfa Antibiotics Nausea And Vomiting    OBJECTIVE: BP 116/76 (BP Location: Right Arm, Patient Position: Sitting, Cuff Size: Large)   Pulse 76   Temp (!) 97.3 F (36.3 C) (Temporal)   Resp 17   Ht 5\' 8"  (1.727 m)   Wt 192 lb (87.1 kg)   SpO2 98%   BMI 29.19 kg/m  Gen: Afebrile. No acute distress. Nontoxic in appearance.  HENT: AT. [place]. Eyes:Pupils Equal Round Reactive to light, Extraocular movements intact,  Conjunctiva without redness, discharge or icterus. CV: RRR no murmur, no edema Chest: CTAB, no wheeze or crackles Skin: no rashes, purpura or petechiae.  Neuro:  Normal gait. PERLA. EOMi. Alert. Oriented x3  Psych: Normal affect, dress and demeanor. Normal speech. Normal thought content and judgment.   ASSESSMENT AND PLAN: Debra Sanchez is a 79 y.o. female present for  Anxiety Stable.   continue zoloft 100 mg QD  Essential hypertension/HLD/on statin therapy Stable. continue Losartan 100 mg Qd  Continue  amlodipine 2.5 BID Tolerating statin- continue.  LFT and lipids collected today  Hypothyroidism, unspecified type  - continue  levothyroxine 50 mcg daily . Labs UTD.      Orders Placed This Encounter  Procedures  . Lipid panel  . Hepatic function panel   Meds ordered this encounter  Medications  . losartan (COZAAR) 100 MG tablet    Sig: Take 1 tablet (100 mg total) by mouth daily.    Dispense:  90 tablet    Refill:  1  . amLODipine (NORVASC) 2.5 MG tablet    Sig: Take 1 tablet (2.5 mg total) by mouth 2 (two) times daily.    Dispense:  180 tablet    Refill:  1  . sertraline (ZOLOFT) 100 MG tablet  Sig: Take 1 tablet (100 mg total) by mouth at bedtime.    Dispense:  90 tablet    Refill:  1   Referral Orders  No referral(s) requested today       Howard Pouch, DO 09/16/2019

## 2019-09-16 NOTE — Patient Instructions (Addendum)
I have refilled your medications for you.  Your BP looks great.  We will call you  With lab results .   Next appt:  End of November for your physical.

## 2019-09-20 DIAGNOSIS — J0100 Acute maxillary sinusitis, unspecified: Secondary | ICD-10-CM | POA: Diagnosis not present

## 2019-09-20 DIAGNOSIS — J029 Acute pharyngitis, unspecified: Secondary | ICD-10-CM | POA: Diagnosis not present

## 2019-09-20 DIAGNOSIS — R05 Cough: Secondary | ICD-10-CM | POA: Diagnosis not present

## 2019-10-20 DIAGNOSIS — N3000 Acute cystitis without hematuria: Secondary | ICD-10-CM | POA: Diagnosis not present

## 2019-10-20 DIAGNOSIS — R300 Dysuria: Secondary | ICD-10-CM | POA: Diagnosis not present

## 2019-11-04 DIAGNOSIS — R300 Dysuria: Secondary | ICD-10-CM | POA: Diagnosis not present

## 2019-11-13 ENCOUNTER — Ambulatory Visit: Payer: Medicare Other | Admitting: Family Medicine

## 2019-12-23 ENCOUNTER — Telehealth: Payer: Self-pay

## 2019-12-23 NOTE — Telephone Encounter (Signed)
A user error has taken place: encounter opened in error, closed for administrative reasons.

## 2019-12-31 ENCOUNTER — Ambulatory Visit: Payer: Medicare Other | Admitting: Family Medicine

## 2020-01-07 ENCOUNTER — Other Ambulatory Visit: Payer: Self-pay | Admitting: Family Medicine

## 2020-01-18 DIAGNOSIS — R350 Frequency of micturition: Secondary | ICD-10-CM | POA: Diagnosis not present

## 2020-01-18 DIAGNOSIS — R300 Dysuria: Secondary | ICD-10-CM | POA: Diagnosis not present

## 2020-01-18 DIAGNOSIS — R109 Unspecified abdominal pain: Secondary | ICD-10-CM | POA: Diagnosis not present

## 2020-01-23 ENCOUNTER — Ambulatory Visit: Payer: Medicare Other | Admitting: Family Medicine

## 2020-01-29 ENCOUNTER — Encounter: Payer: Self-pay | Admitting: Family Medicine

## 2020-02-10 ENCOUNTER — Other Ambulatory Visit (INDEPENDENT_AMBULATORY_CARE_PROVIDER_SITE_OTHER): Payer: Self-pay | Admitting: Internal Medicine

## 2020-02-11 NOTE — Telephone Encounter (Signed)
Last seen 06/18/2019 GERD Dr. Laural Golden.

## 2020-03-09 ENCOUNTER — Encounter: Payer: Medicare Other | Admitting: Family Medicine

## 2020-03-17 ENCOUNTER — Other Ambulatory Visit: Payer: Self-pay

## 2020-03-18 ENCOUNTER — Encounter: Payer: Self-pay | Admitting: Family Medicine

## 2020-03-18 ENCOUNTER — Ambulatory Visit (INDEPENDENT_AMBULATORY_CARE_PROVIDER_SITE_OTHER): Payer: Medicare Other | Admitting: Family Medicine

## 2020-03-18 VITALS — BP 134/80 | HR 91 | Temp 98.5°F | Ht 67.0 in | Wt 190.0 lb

## 2020-03-18 DIAGNOSIS — N3945 Continuous leakage: Secondary | ICD-10-CM | POA: Diagnosis not present

## 2020-03-18 DIAGNOSIS — E663 Overweight: Secondary | ICD-10-CM

## 2020-03-18 DIAGNOSIS — Z0001 Encounter for general adult medical examination with abnormal findings: Secondary | ICD-10-CM | POA: Diagnosis not present

## 2020-03-18 DIAGNOSIS — Z0000 Encounter for general adult medical examination without abnormal findings: Secondary | ICD-10-CM

## 2020-03-18 DIAGNOSIS — I10 Essential (primary) hypertension: Secondary | ICD-10-CM

## 2020-03-18 DIAGNOSIS — Z131 Encounter for screening for diabetes mellitus: Secondary | ICD-10-CM | POA: Diagnosis not present

## 2020-03-18 DIAGNOSIS — E782 Mixed hyperlipidemia: Secondary | ICD-10-CM

## 2020-03-18 DIAGNOSIS — G4700 Insomnia, unspecified: Secondary | ICD-10-CM

## 2020-03-18 DIAGNOSIS — F419 Anxiety disorder, unspecified: Secondary | ICD-10-CM | POA: Diagnosis not present

## 2020-03-18 DIAGNOSIS — K219 Gastro-esophageal reflux disease without esophagitis: Secondary | ICD-10-CM

## 2020-03-18 DIAGNOSIS — E039 Hypothyroidism, unspecified: Secondary | ICD-10-CM

## 2020-03-18 DIAGNOSIS — Z23 Encounter for immunization: Secondary | ICD-10-CM

## 2020-03-18 LAB — CBC WITH DIFFERENTIAL/PLATELET
Basophils Absolute: 0 10*3/uL (ref 0.0–0.1)
Basophils Relative: 0.6 % (ref 0.0–3.0)
Eosinophils Absolute: 0.1 10*3/uL (ref 0.0–0.7)
Eosinophils Relative: 2.3 % (ref 0.0–5.0)
HCT: 37 % (ref 36.0–46.0)
Hemoglobin: 12.2 g/dL (ref 12.0–15.0)
Lymphocytes Relative: 36.6 % (ref 12.0–46.0)
Lymphs Abs: 1.9 10*3/uL (ref 0.7–4.0)
MCHC: 33.1 g/dL (ref 30.0–36.0)
MCV: 89.6 fl (ref 78.0–100.0)
Monocytes Absolute: 0.4 10*3/uL (ref 0.1–1.0)
Monocytes Relative: 8.4 % (ref 3.0–12.0)
Neutro Abs: 2.7 10*3/uL (ref 1.4–7.7)
Neutrophils Relative %: 52.1 % (ref 43.0–77.0)
Platelets: 193 10*3/uL (ref 150.0–400.0)
RBC: 4.13 Mil/uL (ref 3.87–5.11)
RDW: 14 % (ref 11.5–15.5)
WBC: 5.1 10*3/uL (ref 4.0–10.5)

## 2020-03-18 LAB — HEMOGLOBIN A1C: Hgb A1c MFr Bld: 5.6 % (ref 4.6–6.5)

## 2020-03-18 LAB — LIPID PANEL
Cholesterol: 142 mg/dL (ref 0–200)
HDL: 47.6 mg/dL (ref >39.00)
LDL Cholesterol: 70 mg/dL (ref 0–99)
NonHDL: 93.92
Total CHOL/HDL Ratio: 3
Triglycerides: 118 mg/dL (ref 0.0–149.0)
VLDL: 23.6 mg/dL (ref 0.0–40.0)

## 2020-03-18 LAB — COMPREHENSIVE METABOLIC PANEL
ALT: 11 U/L (ref 0–35)
AST: 11 U/L (ref 0–37)
Albumin: 4 g/dL (ref 3.5–5.2)
Alkaline Phosphatase: 74 U/L (ref 39–117)
BUN: 12 mg/dL (ref 6–23)
CO2: 30 mEq/L (ref 19–32)
Calcium: 9.4 mg/dL (ref 8.4–10.5)
Chloride: 104 mEq/L (ref 96–112)
Creatinine, Ser: 0.99 mg/dL (ref 0.40–1.20)
GFR: 53.84 mL/min — ABNORMAL LOW (ref >60.00)
Glucose, Bld: 86 mg/dL (ref 70–99)
Potassium: 4.2 mEq/L (ref 3.5–5.1)
Sodium: 138 mEq/L (ref 135–145)
Total Bilirubin: 0.5 mg/dL (ref 0.2–1.2)
Total Protein: 6.1 g/dL (ref 6.0–8.3)

## 2020-03-18 LAB — TSH: TSH: 2.57 u[IU]/mL (ref 0.35–4.50)

## 2020-03-18 MED ORDER — AMLODIPINE BESYLATE 2.5 MG PO TABS: 2.5000 mg | ORAL_TABLET | Freq: Two times a day (BID) | ORAL | 1 refills | Status: DC

## 2020-03-18 MED ORDER — LOSARTAN POTASSIUM 100 MG PO TABS: 100.0000 mg | ORAL_TABLET | Freq: Every day | ORAL | 1 refills | Status: DC

## 2020-03-18 MED ORDER — SERTRALINE HCL 100 MG PO TABS: 100.0000 mg | ORAL_TABLET | Freq: Every day | ORAL | 1 refills | Status: DC

## 2020-03-18 NOTE — Progress Notes (Signed)
This visit occurred during the SARS-CoV-2 public health emergency.  Safety protocols were in place, including screening questions prior to the visit, additional usage of staff PPE, and extensive cleaning of exam room while observing appropriate contact time as indicated for disinfecting solutions.    Patient ID: Debra Sanchez, female  DOB: 31-Aug-1940, 79 y.o.   MRN: [id number] Patient Care Team    Relationship Specialty Notifications Start End  Ma Hillock, DO PCP - General Family Medicine  04/20/15     Chief Complaint  Patient presents with  . Annual Exam    pt is fasting;     Subjective:  Debra Sanchez is a 79 y.o.  Female  present for CPE/cmc. All past medical history, surgical history, allergies, family history, immunizations, medications and social history were updated in the electronic medical record today. All recent labs, ED visits and hospitalizations within the last year were reviewed.  Health maintenance:  Colonoscopy: > 75 N/A Mammogram: completed: declined mammogram.  Cervical cancer screening: > 65 n/A- hysterectomy Immunizations: tdap UTD 2018, Influenza provided 03/18/20 (encouraged yearly), PNA series  completed, Shingrix completed (only one- does not desire other), covid series declined DEXA: last completed 2016, result normal Assistive device: none Oxygen LYY:TKPT Patient has a Dental home. Hospitalizations/ED visits: reviewed  Essential hypertension Pt reports compliance withCozaar 100 mg and amlodipine 2.5 g twice a day. Blood pressures ranges at [address] [facility] reported WNL Patient denies chest pain, shortness of breath, dizziness or lower extremity edema.   Ptdoes not take adaily baby ASA. She is tolerating start of a statin.  Diet:Watches her diet closely Exercise:Exercises routinely WS:[id number], obesity, family history heart disease  Anxiety/Insomnia, unspecified type: Patient reportscompliancewith Zoloft 100 mg daily. She  reports she is doing well.   Depression screen [facility] 2/9 03/18/2020 09/16/2019 03/18/2019 10/22/2018 01/29/2018  Decreased Interest 0 0 0 0 0  Down, Depressed, Hopeless 0 0 0 0 0  PHQ - 2 Score 0 0 0 0 0  Altered sleeping - - - - 1  Tired, decreased energy - - - - 1  Change in appetite - - - - 0  Feeling bad or failure about yourself  - - - - 0  Trouble concentrating - - - - 0  Moving slowly or fidgety/restless - - - - 0  Suicidal thoughts - - - - 0  PHQ-9 Score - - - - 2   GAD 7 : Generalized Anxiety Score 09/16/2019 03/18/2019  Nervous, Anxious, on Edge 0 0  Control/stop worrying 0 0  Worry too much - different things 0 0  Trouble relaxing 0 0  Restless 0 0  Easily annoyed or irritable 0 0  Afraid - awful might happen 0 0  Total GAD 7 Score 0 0  Anxiety Difficulty Not difficult at all Not difficult at all    Immunization History  Administered Date(s) Administered  . Fluad Quad(high Dose 65+) 03/18/2020  . Influenza, High Dose Seasonal PF 01/16/2017, 01/29/2018, 02/08/2019  . Influenza-Unspecified 02/10/2015  . Pneumococcal Conjugate-13 03/18/2019  . Pneumococcal Polysaccharide-23 01/29/2018  . Tdap 04/12/2006, 11/26/2016  . Zoster Recombinat (Shingrix) 02/08/2019   Past Medical History:  Diagnosis Date  . Anemia   . Anxiety   . Arthritis   . Blood transfusion 2012  . Colon polyp   . DDD (degenerative disc disease), thoracolumbar   . Degenerative disc disease, lumbar   . Family history of adverse reaction to anesthesia    pts sister got very sick /  burnt esophagus   . Gastric ulcer   . GERD (gastroesophageal reflux disease)   . Heart murmur   . History of hiatal hernia   . Hypertension   . Hypothyroidism   . Migraines   . Miscarriage   . Osteoporosis   . Pneumonia yrs ago  . Syncope 2012  . Wears glasses    Allergies  Allergen Reactions  . Hydromorphone Itching, Nausea Only and Other (See Comments)    After IV dilaudid pt flushed and felt dizzy  .  Ciprofloxacin Other (See Comments)    Stomach problems  . Codeine Itching  . Latex Other (See Comments)    fainted  . Morphine And Related Itching  . Nsaids Nausea And Vomiting  . Penicillins Itching and Nausea And Vomiting    Has patient had a PCN reaction causing immediate rash, facial/tongue/throat swelling, SOB or lightheadedness with hypotension: No Has patient had a PCN reaction causing severe rash involving mucus membranes or skin necrosis: No Has patient had a PCN reaction that required hospitalization No Has patient had a PCN reaction occurring within the last 10 years: Yes If all of the above answers are "NO", then may proceed with Cephalosporin use.   . Sulfa Antibiotics Nausea And Vomiting   Past Surgical History:  Procedure Laterality Date  . ABDOMINAL HYSTERECTOMY    . ANTERIOR CERVICAL DECOMP/DISCECTOMY FUSION     C5-6 and C6-7 anterior cervical diskectomy and fusion   . BILATERAL OOPHORECTOMY    . CATARACT EXTRACTION W/PHACO Left 10/20/2014   Procedure: CATARACT EXTRACTION PHACO AND INTRAOCULAR LENS PLACEMENT LEFT EYE;  Surgeon: Tonny Branch, MD;  Location: [facility];  Service: Ophthalmology;  Laterality: Left;  CDE:6.98  . CATARACT EXTRACTION W/PHACO Right 11/17/2014   Procedure: CATARACT EXTRACTION PHACO AND INTRAOCULAR LENS PLACEMENT RIGHT EYE CDE=6.57;  Surgeon: Tonny Branch, MD;  Location: [facility];  Service: Ophthalmology;  Laterality: Right;  . COLONOSCOPY  01/19/2011   Procedure: COLONOSCOPY;  Surgeon: Rogene Houston, MD;  Location: [facility];  Service: Endoscopy;  Laterality: N/A;  2:00  . DILATION AND CURETTAGE OF UTERUS    . ESOPHAGOGASTRODUODENOSCOPY  11/05/2010   Procedure: ESOPHAGOGASTRODUODENOSCOPY (EGD);  Surgeon: Rogene Houston, MD;  Location: [facility];  Service: Endoscopy;  Laterality: N/A;  7:00 am per [facility]  . ESOPHAGOGASTRODUODENOSCOPY N/A 07/25/2012   Procedure: ESOPHAGOGASTRODUODENOSCOPY (EGD);  Surgeon: Rogene Houston, MD;  Location: [facility];  Service: Endoscopy;  Laterality: N/A;  400-moved to 54 Ann notified pt  . ESOPHAGOGASTRODUODENOSCOPY N/A 05/27/2015   Procedure: ESOPHAGOGASTRODUODENOSCOPY (EGD);  Surgeon: Rogene Houston, MD;  Location: [facility];  Service: Endoscopy;  Laterality: N/A;  2:00  . HIATAL HERNIA REPAIR N/A 08/25/2015   Procedure: LAPAROSCOPIC REPAIR OF LARGE TYPE III  HIATAL HERNIA;  Surgeon: Johnathan Hausen, MD;  Location: [facility];  Service: General;  Laterality: N/A;  . LAPAROSCOPIC NISSEN FUNDOPLICATION N/A 05/18/3783   Procedure: ENDOSCOPY, HIATAL HERNIA REPAIR, AND TAKE DOWN OF NISSEN FUNDOPILICATION;  Surgeon: Johnathan Hausen, MD;  Location: [facility];  Service: General;  Laterality: N/A;  . 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 Colon cancer Mother   . Heart disease Mother   . Arthritis Mother   . Heart disease Father   . Arthritis Father   . Arthritis Sister   . Arthritis Brother   .  Lung cancer Brother        smoker  . Arthritis Maternal Aunt   . Arthritis Maternal Uncle   . Diabetes Maternal Uncle    Social History   Social History Narrative   Married to [employer].   2 children Kerin Ransom and Anne Ng)    Retired, 2 yrs college.    Caffeine beverages.   Takes a daily vitamin.   Wears her seatbelt. Smoke detector in the home.    Feels safe in her relationships.                    Allergies as of 03/18/2020      Reactions   Hydromorphone Itching, Nausea Only, Other (See Comments)   After IV dilaudid pt flushed and felt dizzy   Ciprofloxacin Other (See Comments)   Stomach problems   Codeine Itching   Latex Other (See Comments)   fainted   Morphine And Related Itching   Nsaids Nausea And Vomiting   Penicillins Itching, Nausea And Vomiting   Has patient had a PCN reaction causing immediate rash, facial/tongue/throat swelling, SOB or lightheadedness with hypotension:  No Has patient had a PCN reaction causing severe rash involving mucus membranes or skin necrosis: No Has patient had a PCN reaction that required hospitalization No Has patient had a PCN reaction occurring within the last 10 years: Yes If all of the above answers are "NO", then may proceed with Cephalosporin use.   Sulfa Antibiotics Nausea And Vomiting      Medication List       Accurate as of March 18, 2020 12:41 PM. If you have any questions, ask your nurse or doctor.        acetaminophen 325 MG tablet Commonly known as: TYLENOL Take 325 mg by mouth every 6 (six) hours as needed for mild pain (For pain.).   amLODipine 2.5 MG tablet Commonly known as: NORVASC Take 1 tablet (2.5 mg total) by mouth 2 (two) times daily.   atorvastatin 20 MG tablet Commonly known as: LIPITOR Take 1 tablet (20 mg total) by mouth daily.   famotidine 40 MG tablet Commonly known as: PEPCID Take 1 tablet (40 mg total) by mouth at bedtime.   ipratropium 0.06 % nasal spray Commonly known as: ATROVENT Place 2 sprays into both nostrils 2 (two) times daily.   levothyroxine 50 MCG tablet Commonly known as: SYNTHROID TAKE 1 TABLET BY MOUTH  DAILY   losartan 100 MG tablet Commonly known as: COZAAR Take 1 tablet (100 mg total) by mouth daily.   MELATONIN PO Take 10 mg by mouth at bedtime.   pantoprazole 40 MG tablet Commonly known as: PROTONIX TAKE 1 TABLET BY MOUTH  DAILY BEFORE BREAKFAST   phenazopyridine 200 MG tablet Commonly known as: PYRIDIUM Take 200 mg by mouth 3 (three) times daily as needed. for pain   sertraline 100 MG tablet Commonly known as: ZOLOFT Take 1 tablet (100 mg total) by mouth at bedtime.   SUMAtriptan 100 MG tablet Commonly known as: IMITREX TAKE 1 TABLET BY MOUTH AS  NEEDED FOR MIGRAINE. MAY  REPEAT IN 2 HOURS IF  HEADACHE PERSISTS OR  RECURS.       All past medical history, surgical history, allergies, family history, immunizations andmedications were  updated in the EMR today and reviewed under the history and medication portions of their EMR.     No results found for this or any previous visit (from the past 2160 hour(s)).  No results found.  ROS: 14 pt review of systems performed and negative (unless mentioned in an HPI)  Objective: BP 134/80   Pulse 91   Temp 98.5 F (36.9 C) (Oral)   Ht 5\' 7"  (1.702 m)   Wt 190 lb (86.2 kg)   SpO2 98%   BMI 29.76 kg/m  Gen: Afebrile. No acute distress. Nontoxic in appearance, well-developed, well-nourished, pleasant female.  HENT: AT. [place]. Bilateral TM visualized and normal in appearance, normal external auditory canal. MMM, no oral lesions, adequate dentition. Bilateral nares within normal limits. Throat without erythema, ulcerations or exudates. no Cough on exam, no hoarseness on exam. Eyes:Pupils Equal Round Reactive to light, Extraocular movements intact,  Conjunctiva without redness, discharge or icterus. Neck/lymp/endocrine: Supple,no lymphadenopathy, no thyromegaly CV: RRR no murmur, no edema, +2/4 P posterior tibialis pulses.  Chest: CTAB, no wheeze, rhonchi or crackles. normal Respiratory effort. good Air movement. Abd: Soft. flat. NTND. BS present. no Masses palpated. No hepatosplenomegaly. No rebound tenderness or guarding. Skin: no rashes, purpura or petechiae. Warm and well-perfused. Skin intact. Neuro/Msk: Normal gait. PERLA. EOMi. Alert. Oriented x3.  Cranial nerves II through XII intact. Muscle strength 5/5 upper/lower extremity. DTRs equal bilaterally. Psych: Normal affect, dress and demeanor. Normal speech. Normal thought content and judgment.   No exam data present  Assessment/plan: Debra Sanchez is a 79 y.o. female present for CPE/cmc Anxiety Stable.   Continue zoloft 100 mg QD  Essential hypertension/HLD/on statin therapy Stable.  Continue Losartan 100 mg Qd  Continue  amlodipine 2.5 BID Continue  statin- continue.   Hypothyroidism, unspecified type  -  continue   levothyroxine 50 mcg daily .  - tsh collected today and refills will be provided after result received in appropriate dose.    Need for influenza vaccination - Flu Vaccine QUAD High Dose(Fluad)  Diabetes mellitus screening - Hemoglobin A1c  Urinary leakage/incontinence: Patient complains of worsening urinary leakage on her chronic urinary incontinence.  Offered gynecology referral to her today for consideration of treatment.  She would like to look into a gynecologist in her area first and will call back if needing a formal referral.  We will be happy to place this for her if she needs.  She understands she will have to supply the name of the gynecologist she wants referred to  Encounter for annual health examination Patient was encouraged to exercise greater than 150 minutes a week. Patient was encouraged to choose a diet filled with fresh fruits and vegetables, and lean meats. AVS provided to patient today for education/recommendation on gender specific health and safety maintenance. Colonoscopy: > 75 N/A Mammogram: completed: declined mammogram.  Cervical cancer screening: > 65 n/A- hysterectomy Immunizations: tdap UTD 2018, Influenza provided 03/18/20 (encouraged yearly), PNA series  completed, Shingrix completed (only one- does not desire other), covid series declined DEXA: last completed 2016, result normal  Return in about 5 months (around 08/31/2020) for CMC (30 min) and 1 yr cpe.   Orders Placed This Encounter  Procedures  . Flu Vaccine QUAD High Dose(Fluad)  . CBC with Differential/Platelet  . Comprehensive metabolic panel  . Hemoglobin A1c  . Lipid panel  . TSH   Meds ordered this encounter  Medications  . sertraline (ZOLOFT) 100 MG tablet    Sig: Take 1 tablet (100 mg total) by mouth at bedtime.    Dispense:  90 tablet    Refill:  1  . losartan (COZAAR) 100 MG tablet    Sig: Take 1 tablet (100 mg total) by mouth  daily.    Dispense:  90 tablet     Refill:  1  . amLODipine (NORVASC) 2.5 MG tablet    Sig: Take 1 tablet (2.5 mg total) by mouth 2 (two) times daily.    Dispense:  180 tablet    Refill:  1   Referral Orders  No referral(s) requested today     Electronically signed by: Howard Pouch, [place]

## 2020-03-18 NOTE — Patient Instructions (Signed)
Health Maintenance After Age 80 After age 80, you are at a higher risk for certain long-term diseases and infections as well as injuries from falls. Falls are a major cause of broken bones and head injuries in people who are older than age 80. Getting regular preventive care can help to keep you healthy and well. Preventive care includes getting regular testing and making lifestyle changes as recommended by your health care provider. Talk with your health care provider about:  Which screenings and tests you should have. A screening is a test that checks for a disease when you have no symptoms.  A diet and exercise plan that is right for you. What should I know about screenings and tests to prevent falls? Screening and testing are the best ways to find a health problem early. Early diagnosis and treatment give you the best chance of managing medical conditions that are common after age 80. Certain conditions and lifestyle choices may make you more likely to have a fall. Your health care provider may recommend:  Regular vision checks. Poor vision and conditions such as cataracts can make you more likely to have a fall. If you wear glasses, make sure to get your prescription updated if your vision changes.  Medicine review. Work with your health care provider to regularly review all of the medicines you are taking, including over-the-counter medicines. Ask your health care provider about any side effects that may make you more likely to have a fall. Tell your health care provider if any medicines that you take make you feel dizzy or sleepy.  Osteoporosis screening. Osteoporosis is a condition that causes the bones to get weaker. This can make the bones weak and cause them to break more easily.  Blood pressure screening. Blood pressure changes and medicines to control blood pressure can make you feel dizzy.  Strength and balance checks. Your health care provider may recommend certain tests to check your  strength and balance while standing, walking, or changing positions.  Foot health exam. Foot pain and numbness, as well as not wearing proper footwear, can make you more likely to have a fall.  Depression screening. You may be more likely to have a fall if you have a fear of falling, feel emotionally low, or feel unable to do activities that you used to do.  Alcohol use screening. Using too much alcohol can affect your balance and may make you more likely to have a fall. What actions can I take to lower my risk of falls? General instructions  Talk with your health care provider about your risks for falling. Tell your health care provider if: ? You fall. Be sure to tell your health care provider about all falls, even ones that seem minor. ? You feel dizzy, sleepy, or off-balance.  Take over-the-counter and prescription medicines only as told by your health care provider. These include any supplements.  Eat a healthy diet and maintain a healthy weight. A healthy diet includes low-fat dairy products, low-fat (lean) meats, and fiber from whole grains, beans, and lots of fruits and vegetables. Home safety  Remove any tripping hazards, such as rugs, cords, and clutter.  Install safety equipment such as grab bars in bathrooms and safety rails on stairs.  Keep rooms and walkways well-lit. Activity   Follow a regular exercise program to stay fit. This will help you maintain your balance. Ask your health care provider what types of exercise are appropriate for you.  If you need a cane or   walker, use it as recommended by your health care provider.  Wear supportive shoes that have nonskid soles. Lifestyle  Do not drink alcohol if your health care provider tells you not to drink.  If you drink alcohol, limit how much you have: ? 0-1 drink a day for women. ? 0-2 drinks a day for men.  Be aware of how much alcohol is in your drink. In the [place] one drink equals one typical bottle of beer (12  oz), one-half glass of wine (5 oz), or one shot of hard liquor (1 oz).  Do not use any products that contain nicotine or tobacco, such as cigarettes and e-cigarettes. If you need help quitting, ask your health care provider. Summary  Having a healthy lifestyle and getting preventive care can help to protect your health and wellness after age 80.  Screening and testing are the best way to find a health problem early and help you avoid having a fall. Early diagnosis and treatment give you the best chance for managing medical conditions that are more common for people who are older than age 80.  Falls are a major cause of broken bones and head injuries in people who are older than age 80. Take precautions to prevent a fall at home.  Work with your health care provider to learn what changes you can make to improve your health and wellness and to prevent falls. This information is not intended to replace advice given to you by your health care provider. Make sure you discuss any questions you have with your health care provider. Document Revised: 07/19/2018 Document Reviewed: 02/08/2017 Elsevier Patient Education  2020 [employer] Inc[employer]  

## 2020-03-19 ENCOUNTER — Other Ambulatory Visit: Payer: Self-pay | Admitting: Family Medicine

## 2020-03-19 DIAGNOSIS — E782 Mixed hyperlipidemia: Secondary | ICD-10-CM

## 2020-03-19 MED ORDER — ATORVASTATIN CALCIUM 20 MG PO TABS: 20.0000 mg | ORAL_TABLET | Freq: Every day | ORAL | 3 refills | Status: DC

## 2020-03-19 MED ORDER — LEVOTHYROXINE SODIUM 50 MCG PO TABS: 50.0000 ug | ORAL_TABLET | Freq: Every day | ORAL | 3 refills | Status: DC

## 2020-04-15 ENCOUNTER — Other Ambulatory Visit (INDEPENDENT_AMBULATORY_CARE_PROVIDER_SITE_OTHER): Payer: Self-pay | Admitting: Internal Medicine

## 2020-04-16 NOTE — Telephone Encounter (Signed)
Last March 9,2021 by Dr. Karilyn Cota for Genella Rife

## 2020-04-24 DIAGNOSIS — R059 Cough, unspecified: Secondary | ICD-10-CM | POA: Diagnosis not present

## 2020-04-24 DIAGNOSIS — R509 Fever, unspecified: Secondary | ICD-10-CM | POA: Diagnosis not present

## 2020-04-24 DIAGNOSIS — J329 Chronic sinusitis, unspecified: Secondary | ICD-10-CM | POA: Diagnosis not present

## 2020-04-24 DIAGNOSIS — R0981 Nasal congestion: Secondary | ICD-10-CM | POA: Diagnosis not present

## 2020-06-23 ENCOUNTER — Ambulatory Visit (INDEPENDENT_AMBULATORY_CARE_PROVIDER_SITE_OTHER): Payer: Medicare Other | Admitting: Internal Medicine

## 2020-10-02 ENCOUNTER — Ambulatory Visit (INDEPENDENT_AMBULATORY_CARE_PROVIDER_SITE_OTHER): Payer: Medicare Other | Admitting: Family Medicine

## 2020-10-02 ENCOUNTER — Other Ambulatory Visit: Payer: Self-pay

## 2020-10-02 ENCOUNTER — Encounter: Payer: Self-pay | Admitting: Family Medicine

## 2020-10-02 VITALS — BP 119/76 | HR 70 | Temp 98.8°F | Ht 67.0 in | Wt 188.0 lb

## 2020-10-02 DIAGNOSIS — F419 Anxiety disorder, unspecified: Secondary | ICD-10-CM

## 2020-10-02 DIAGNOSIS — I10 Essential (primary) hypertension: Secondary | ICD-10-CM | POA: Diagnosis not present

## 2020-10-02 DIAGNOSIS — R413 Other amnesia: Secondary | ICD-10-CM | POA: Diagnosis not present

## 2020-10-02 DIAGNOSIS — E039 Hypothyroidism, unspecified: Secondary | ICD-10-CM

## 2020-10-02 DIAGNOSIS — E782 Mixed hyperlipidemia: Secondary | ICD-10-CM | POA: Diagnosis not present

## 2020-10-02 MED ORDER — ATORVASTATIN CALCIUM 20 MG PO TABS: 20.0000 mg | ORAL_TABLET | Freq: Every day | ORAL | 3 refills | Status: DC

## 2020-10-02 MED ORDER — LOSARTAN POTASSIUM 100 MG PO TABS: 100.0000 mg | ORAL_TABLET | Freq: Every day | ORAL | 1 refills | Status: DC

## 2020-10-02 MED ORDER — AMLODIPINE BESYLATE 2.5 MG PO TABS: 2.5000 mg | ORAL_TABLET | Freq: Two times a day (BID) | ORAL | 1 refills | Status: DC

## 2020-10-02 MED ORDER — SERTRALINE HCL 100 MG PO TABS: 100.0000 mg | ORAL_TABLET | Freq: Every day | ORAL | 1 refills | Status: DC

## 2020-10-02 NOTE — Patient Instructions (Addendum)
Great to see you today.  I have refilled the medication(s) we provide.   If labs were collected, we will inform you of lab results once received either by echart message or telephone call.   - echart message- for normal results that have been seen by the patient already.   - telephone call: abnormal results or if patient has not viewed results in their echart. Management of Memory Problems  There are some general things you can do to help manage your memory problems.  Your memory may not in fact recover, but by using techniques and strategies you will be able to manage your memory difficulties better.  1)  Establish a routine. Try to establish and then stick to a regular routine.  By doing this, you will get used to what to expect and you will reduce the need to rely on your memory.  Also, try to do things at the same time of day, such as taking your medication or checking your calendar first thing in the morning. Think about think that you can do as a part of a regular routine and make a list.  Then enter them into a daily planner to remind you.  This will help you establish a routine.  2)  Organize your environment. Organize your environment so that it is uncluttered.  Decrease visual stimulation.  Place everyday items such as keys or cell phone in the same place every day (ie.  Basket next to front door) Use post it notes with a brief message to yourself (ie. Turn off light, lock the door) Use labels to indicate where things go (ie. Which cupboards are for food, dishes, etc.) Keep a notepad and pen by the telephone to take messages  3)  Memory Aids A diary or journal/notebook/daily planner Making a list (shopping list, chore list, to do list that needs to be done) Using an alarm as a reminder (kitchen timer or cell phone alarm) Using cell phone to store information (Notes, Calendar, Reminders) Calendar/White board placed in a prominent position Post-it notes  In order for memory aids  to be useful, you need to have good habits.  It's no good remembering to make a note in your journal if you don't remember to look in it.  Try setting aside a certain time of day to look in journal.  4)  Improving mood and managing fatigue. There may be other factors that contribute to memory difficulties.  Factors, such as anxiety, depression and tiredness can affect memory. Regular gentle exercise can help improve your mood and give you more energy. Simple relaxation techniques may help relieve symptoms of anxiety Try to get back to completing activities or hobbies you enjoyed doing in the past. Learn to pace yourself through activities to decrease fatigue. Find out about some local support groups where you can share experiences with others. Try and achieve 7-8 hours of sleep at night.

## 2020-10-02 NOTE — Progress Notes (Signed)
This visit occurred during the SARS-CoV-2 public health emergency.  Safety protocols were in place, including screening questions prior to the visit, additional usage of staff PPE, and extensive cleaning of exam room while observing appropriate contact time as indicated for disinfecting solutions.    Patient ID: Debra Sanchez, female  DOB: Feb 14, 1941, 80 y.o.   MRN: [id number] Patient Care Team    Relationship Specialty Notifications Start End  Ma Hillock, DO PCP - General Family Medicine  04/20/15     Chief Complaint  Patient presents with   Hypertension    [place]; pt is not fasting    Subjective: Debra Sanchez is a 80 y.o.  Female  present for cmc. All past medical history, surgical history, allergies, family history, immunizations, medications and social history were updated in the electronic medical record today. All recent labs, ED visits and hospitalizations within the last year were reviewed.  Essential hypertension Pt reports compliance with Cozaar 100 mg and amlodipine 2.5 g twice a day (her choice) Blood pressures ranges at home are reported WNL Patient denies chest pain, shortness of breath, dizziness or lower extremity edema.   Pt does not take a daily baby ASA. She is tolerating start of a statin.  Diet: Watches her diet closely Exercise: Exercises routinely RF: Hypertension, obesity, family history heart disease    Anxiety/Insomnia, unspecified type: Patient reports compliance  with Zoloft 100 mg daily. She reports she is doing well.    Hypothyroidism:  Pt reports compliance with levo 50 mcg qd on an empty stomach. She has noticed over the last month she has had memory changes and she is concerned she is getting dementia. She takes a vit d and daily vitamin. She does wake herself on few times a night gasping for breath. She does snore. Body mass index is 29.44 kg/m. She also had covid 6 months ago.   Depression screen [facility] 2/9 03/18/2020 09/16/2019 03/18/2019  10/22/2018 01/29/2018  Decreased Interest 0 0 0 0 0  Down, Depressed, Hopeless 0 0 0 0 0  PHQ - 2 Score 0 0 0 0 0  Altered sleeping - - - - 1  Tired, decreased energy - - - - 1  Change in appetite - - - - 0  Feeling bad or failure about yourself  - - - - 0  Trouble concentrating - - - - 0  Moving slowly or fidgety/restless - - - - 0  Suicidal thoughts - - - - 0  PHQ-9 Score - - - - 2   GAD 7 : Generalized Anxiety Score 09/16/2019 03/18/2019  Nervous, Anxious, on Edge 0 0  Control/stop worrying 0 0  Worry too much - different things 0 0  Trouble relaxing 0 0  Restless 0 0  Easily annoyed or irritable 0 0  Afraid - awful might happen 0 0  Total GAD 7 Score 0 0  Anxiety Difficulty Not difficult at all Not difficult at all    Immunization History  Administered Date(s) Administered   Fluad Quad(high Dose 65+) 03/18/2020   Influenza Split 03/08/2020   Influenza, High Dose Seasonal PF 01/16/2017, 01/29/2018, 02/08/2019   Influenza-Unspecified 02/10/2015   Pneumococcal Conjugate-13 03/18/2019   Pneumococcal Polysaccharide-23 01/29/2018   Tdap 04/12/2006, 11/26/2016   Zoster Recombinat (Shingrix) 02/08/2019   Past Medical History:  Diagnosis Date   Anemia    Anxiety    Arthritis    Blood transfusion 2012   Colon polyp    DDD (degenerative disc  disease), thoracolumbar    Degenerative disc disease, lumbar    Family history of adverse reaction to anesthesia    pts sister got very sick / burnt esophagus    Gastric ulcer    GERD (gastroesophageal reflux disease)    Heart murmur    History of hiatal hernia    Hypertension    Hypothyroidism    Migraines    Miscarriage    Osteoporosis    Pneumonia yrs ago   Syncope 2012   Wears glasses    Allergies  Allergen Reactions   Hydromorphone Itching, Nausea Only and Other (See Comments)    After IV dilaudid pt flushed and felt dizzy   Ciprofloxacin Other (See Comments)    Stomach problems   Codeine Itching   Latex Other (See  Comments)    fainted   Morphine And Related Itching   Nsaids Nausea And Vomiting   Penicillins Itching and Nausea And Vomiting    Has patient had a PCN reaction causing immediate rash, facial/tongue/throat swelling, SOB or lightheadedness with hypotension: No Has patient had a PCN reaction causing severe rash involving mucus membranes or skin necrosis: No Has patient had a PCN reaction that required hospitalization No Has patient had a PCN reaction occurring within the last 10 years: Yes If all of the above answers are "NO", then may proceed with Cephalosporin use.    Sulfa Antibiotics Nausea And Vomiting   Past Surgical History:  Procedure Laterality Date   ABDOMINAL HYSTERECTOMY     ANTERIOR CERVICAL DECOMP/DISCECTOMY FUSION     C5-6 and C6-7 anterior cervical diskectomy and fusion    BILATERAL OOPHORECTOMY     CATARACT EXTRACTION W/PHACO Left 10/20/2014   Procedure: CATARACT EXTRACTION PHACO AND INTRAOCULAR LENS PLACEMENT LEFT EYE;  Surgeon: Tonny Branch, MD;  Location: [facility];  Service: Ophthalmology;  Laterality: Left;  CDE:6.98   CATARACT EXTRACTION W/PHACO Right 11/17/2014   Procedure: CATARACT EXTRACTION PHACO AND INTRAOCULAR LENS PLACEMENT RIGHT EYE CDE=6.57;  Surgeon: Tonny Branch, MD;  Location: [facility];  Service: Ophthalmology;  Laterality: Right;   COLONOSCOPY  01/19/2011   Procedure: COLONOSCOPY;  Surgeon: Rogene Houston, MD;  [facility];  Service: Endoscopy;  Laterality: N/A;  2:00   DILATION AND CURETTAGE OF UTERUS     ESOPHAGOGASTRODUODENOSCOPY  11/05/2010   Procedure: ESOPHAGOGASTRODUODENOSCOPY (EGD);  Surgeon: Rogene Houston, MD;  [facility];  Service: Endoscopy;  Laterality: N/A;  7:00 am per Melanie   ESOPHAGOGASTRODUODENOSCOPY N/A 07/25/2012   Procedure: ESOPHAGOGASTRODUODENOSCOPY (EGD);  Surgeon: Rogene Houston, MD;  [facility];  Service: Endoscopy;  Laterality: N/A;  400-moved to 70 Ann notified pt   ESOPHAGOGASTRODUODENOSCOPY N/A  05/27/2015   Procedure: ESOPHAGOGASTRODUODENOSCOPY (EGD);  Surgeon: Rogene Houston, MD;  [facility];  Service: Endoscopy;  Laterality: N/A;  2:00   HIATAL HERNIA REPAIR N/A 08/25/2015   Procedure: LAPAROSCOPIC REPAIR OF LARGE TYPE III  HIATAL HERNIA;  Surgeon: Johnathan Hausen, MD;  Location: [facility];  Service: General;  Laterality: N/A;   LAPAROSCOPIC NISSEN FUNDOPLICATION N/A 06/11/2023   Procedure: ENDOSCOPY, HIATAL HERNIA REPAIR, AND TAKE DOWN OF NISSEN FUNDOPILICATION;  Surgeon: Johnathan Hausen, MD;  Location: [facility];  Service: General;  Laterality: N/A;   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   2 children Kerin Ransom and Anne Ng)    Retired, 2 yrs college.    Caffeine beverages.   Takes a daily vitamin.   Wears her seatbelt. Smoke detector in the home.    Feels safe in her relationships.                    Allergies as of 10/02/2020       Reactions   Hydromorphone Itching, Nausea Only, Other (See Comments)   After IV dilaudid pt flushed and felt dizzy   Ciprofloxacin Other (See Comments)   Stomach problems   Codeine Itching   Latex Other (See Comments)   fainted   Morphine And Related Itching   Nsaids Nausea And Vomiting   Penicillins Itching, Nausea And Vomiting   Has patient had a PCN reaction causing immediate rash, facial/tongue/throat swelling, SOB or lightheadedness with hypotension: No Has patient had a PCN reaction causing severe rash involving mucus membranes or skin necrosis: No Has patient had a PCN  reaction that required hospitalization No Has patient had a PCN reaction occurring within the last 10 years: Yes If all of the above answers are "NO", then may proceed with Cephalosporin use.   Sulfa Antibiotics Nausea And Vomiting        Medication List        Accurate as of October 02, 2020  1:44 PM. If you have any questions, ask your nurse or doctor.          acetaminophen 325 MG tablet Commonly known as: TYLENOL Take 325 mg by mouth every 6 (six) hours as needed for mild pain (For pain.).   amLODipine 2.5 MG tablet Commonly known as: NORVASC Take 1 tablet (2.5 mg total) by mouth 2 (two) times daily.   atorvastatin 20 MG tablet Commonly known as: LIPITOR Take 1 tablet (20 mg total) by mouth daily.   famotidine 40 MG tablet Commonly known as: PEPCID TAKE 1 TABLET BY MOUTH AT  BEDTIME   ipratropium 0.06 % nasal spray Commonly known as: ATROVENT Place 2 sprays into both nostrils 2 (two) times daily.   levothyroxine 50 MCG tablet Commonly known as: SYNTHROID Take 1 tablet (50 mcg total) by mouth daily.   losartan 100 MG tablet Commonly known as: COZAAR Take 1 tablet (100 mg total) by mouth daily.   MELATONIN PO Take 10 mg by mouth at bedtime.   pantoprazole 40 MG tablet Commonly known as: PROTONIX TAKE 1 TABLET BY MOUTH  DAILY BEFORE BREAKFAST   phenazopyridine 200 MG tablet Commonly known as: PYRIDIUM Take 200 mg by mouth 3 (three) times daily as needed. for pain   sertraline 100 MG tablet Commonly known as: ZOLOFT Take 1 tablet (100 mg total) by mouth at bedtime.   SUMAtriptan 100 MG tablet Commonly known as: IMITREX TAKE 1 TABLET BY MOUTH AS  NEEDED FOR MIGRAINE. MAY  REPEAT IN 2 HOURS IF  HEADACHE PERSISTS OR  RECURS.        All past medical history, surgical history, allergies, family history, immunizations andmedications were updated in the EMR today  and reviewed under the history and medication portions of their EMR.     No results found for  this or any previous visit (from the past 2160 hour(s)).  No results found.   ROS: 14 pt review of systems performed and negative (unless mentioned in an HPI)  Objective: BP 119/76   Pulse 70   Temp 98.8 F (37.1 C) (Oral)   Ht 5' 7"  (1.702 m)   Wt 188 lb (85.3 kg)   SpO2 98%   BMI 29.44 kg/m  Gen: Afebrile. No acute distress. Nontoxic. Pleasant female.  HENT: AT. [place].  Eyes:Pupils Equal Round Reactive to light, Extraocular movements intact,  Conjunctiva without redness, discharge or icterus. Neck/lymp/endocrine: Supple,no lymphadenopathy, no thyromegaly CV: RRR no murmur, no edema, +2/4 P posterior tibialis pulses Chest: CTAB, no wheeze or crackles Skin: no rashes, purpura or petechiae.  Neuro:  Normal gait. PERLA. EOMi. Alert. Oriented x3 Psych: Normal affect, dress and demeanor. Normal speech. Normal thought content and judgment.  No results found.  Assessment/plan: Debra Sanchez is a 80 y.o. female present for CPE/cmc Anxiety Stable.  Continue  zoloft 100 mg QD   Essential hypertension/HLD/on statin therapy Stable.  Continue Losartan 100 mg Qd  Continue amlodipine 2.5 BID Continue liptor 20 mg qd  Hypothyroidism, unspecified type  - continue  levothyroxine 50 mcg daily > she is having some memory issues.   Will repeat tsh, t 4 free to ensure not cause.   Memory changes Will start work up for her memory changes today. She seem to have symptoms of sleep apnea which may also be contributing.  - TSH - T4, free - RPR - Vitamin D (25 hydroxy) - B12 - PTH, Intact and Calcium - Comp Met (CMET) - Ambulatory referral to Neurology> sleep studies.  - consider MRI if labs do not indicate cause.    Return in about 5 months (around 03/15/2021) for [place] (30 min).   Orders Placed This Encounter  Procedures   TSH   T4, free   RPR   Vitamin D (25 hydroxy)   B12   PTH, Intact and Calcium   Comp Met (CMET)   Ambulatory referral to Neurology    Meds ordered  this encounter  Medications   sertraline (ZOLOFT) 100 MG tablet    Sig: Take 1 tablet (100 mg total) by mouth at bedtime.    Dispense:  90 tablet    Refill:  1   losartan (COZAAR) 100 MG tablet    Sig: Take 1 tablet (100 mg total) by mouth daily.    Dispense:  90 tablet    Refill:  1   atorvastatin (LIPITOR) 20 MG tablet    Sig: Take 1 tablet (20 mg total) by mouth daily.    Dispense:  90 tablet    Refill:  3   amLODipine (NORVASC) 2.5 MG tablet    Sig: Take 1 tablet (2.5 mg total) by mouth 2 (two) times daily.    Dispense:  180 tablet    Refill:  1    Referral Orders  Ambulatory referral to Neurology     Electronically signed by: Howard Pouch, DO [place]

## 2020-10-05 ENCOUNTER — Telehealth: Payer: Self-pay | Admitting: Family Medicine

## 2020-10-05 DIAGNOSIS — R413 Other amnesia: Secondary | ICD-10-CM

## 2020-10-05 LAB — TSH: TSH: 1.72 mIU/L (ref 0.40–4.50)

## 2020-10-05 LAB — PTH, INTACT AND CALCIUM
Calcium: 9.3 mg/dL (ref 8.6–10.4)
PTH: 50 pg/mL (ref 16–77)

## 2020-10-05 LAB — COMPREHENSIVE METABOLIC PANEL
AG Ratio: 1.8 (calc) (ref 1.0–2.5)
ALT: 10 U/L (ref 6–29)
AST: 13 U/L (ref 10–35)
Albumin: 4 g/dL (ref 3.6–5.1)
Alkaline phosphatase (APISO): 78 U/L (ref 37–153)
BUN: 10 mg/dL (ref 7–25)
CO2: 26 mmol/L (ref 20–32)
Calcium: 9.3 mg/dL (ref 8.6–10.4)
Chloride: 104 mmol/L (ref 98–110)
Creat: 0.87 mg/dL (ref 0.60–0.88)
Globulin: 2.2 g/dL (calc) (ref 1.9–3.7)
Glucose, Bld: 90 mg/dL (ref 65–99)
Potassium: 4.1 mmol/L (ref 3.5–5.3)
Sodium: 138 mmol/L (ref 135–146)
Total Bilirubin: 0.4 mg/dL (ref 0.2–1.2)
Total Protein: 6.2 g/dL (ref 6.1–8.1)

## 2020-10-05 LAB — RPR: RPR Ser Ql: NONREACTIVE

## 2020-10-05 LAB — VITAMIN D 25 HYDROXY (VIT D DEFICIENCY, FRACTURES): Vit D, 25-Hydroxy: 71 ng/mL (ref 30–100)

## 2020-10-05 LAB — VITAMIN B12: Vitamin B-12: 510 pg/mL (ref 200–1100)

## 2020-10-05 LAB — T4, FREE: Free T4: 1.1 ng/dL (ref 0.8–1.8)

## 2020-10-05 NOTE — Telephone Encounter (Signed)
Spoke with pt husband regarding labs and instructions.   

## 2020-10-05 NOTE — Telephone Encounter (Signed)
Please inform patient: Her electrolytes, liver and kidney function are normal. Calcium levels are normal. B12 levels are in the mid range.  Would encourage her to start a sublingual B12 [phone] mcg every other day. Sugar/glucose levels were normal. Thyroid levels are normal.  Vitamin D levels are in the high normal range> if she is taking a vitamin D supplement, I would encourage her to decrease by half the dose.  Goal vitamin D is between 40-50, once vitamin D gets in the level of 70 and above it can cause some side effects and eventually can become toxic.  Fatigue, mental fog, constipation, weakness are some common side effects including urinary frequency.  I had also referred her to neurology during her appointment so that she could have a sleep study completed.   Obstructive sleep apnea is a common cause of memory changes if left untreated.   Lastly, since her labs do not indicate cause I did place the MRI imaging of her brain order.  They will call her to get this scheduled for her at [facility] on [facility].

## 2020-10-06 NOTE — Telephone Encounter (Signed)
Spoke with pt regarding labs and instructions.   

## 2020-10-13 ENCOUNTER — Ambulatory Visit (INDEPENDENT_AMBULATORY_CARE_PROVIDER_SITE_OTHER): Payer: Medicare Other | Admitting: Internal Medicine

## 2020-10-13 ENCOUNTER — Other Ambulatory Visit: Payer: Self-pay

## 2020-10-13 ENCOUNTER — Encounter (INDEPENDENT_AMBULATORY_CARE_PROVIDER_SITE_OTHER): Payer: Self-pay | Admitting: Internal Medicine

## 2020-10-13 ENCOUNTER — Telehealth: Payer: Self-pay

## 2020-10-13 VITALS — BP 147/74 | HR 91 | Temp 98.9°F | Ht 67.0 in | Wt 188.4 lb

## 2020-10-13 DIAGNOSIS — R413 Other amnesia: Secondary | ICD-10-CM

## 2020-10-13 DIAGNOSIS — K219 Gastro-esophageal reflux disease without esophagitis: Secondary | ICD-10-CM | POA: Diagnosis not present

## 2020-10-13 DIAGNOSIS — R1319 Other dysphagia: Secondary | ICD-10-CM | POA: Insufficient documentation

## 2020-10-13 DIAGNOSIS — G4739 Other sleep apnea: Secondary | ICD-10-CM

## 2020-10-13 MED ORDER — ESOMEPRAZOLE MAGNESIUM 40 MG PO CPDR: 40.0000 mg | DELAYED_RELEASE_CAPSULE | Freq: Every day | ORAL | 3 refills | Status: DC

## 2020-10-13 NOTE — Telephone Encounter (Signed)
New referral placed.

## 2020-10-13 NOTE — Telephone Encounter (Signed)
LM for pt to return call regarding referral  

## 2020-10-13 NOTE — Patient Instructions (Signed)
Physician will call with results of barium study when completed. 

## 2020-10-13 NOTE — Telephone Encounter (Signed)
Spoke with patient and she really does not want to drive all the way to [facility], would like to see pulmonologist in [place] area.   Please advise.

## 2020-10-13 NOTE — Telephone Encounter (Signed)
The sleep study was not ordered for her.  I sent a referral for her to a sleep specialist at [facility].  If she wants this location changed, we can see if there is a pulmonologist that is in that location that could see her.  Otherwise I suggest she stay with [facility] since they are specialty in this area.

## 2020-10-13 NOTE — Addendum Note (Signed)
Addended by: Howard Pouch A on: 10/13/2020 03:30 PM   Modules accepted: Orders

## 2020-10-13 NOTE — Telephone Encounter (Signed)
PT called requesting for sleep study to be done at [place] [facility] [place]. Brain scan has already been changed to [facility].

## 2020-10-13 NOTE — Progress Notes (Signed)
Presenting complaint;  Swallowing difficulty. Heartburn medicine not working anymore.  Database and subjective:  Patient is 80-year-old Caucasian female who has chronic GERD known large hiatal hernia status post 2 laparoscopic repairs who is here for scheduled visit.  She was last seen in March 2021.  She was still having some nocturnal symptoms and famotidine dose was doubled to 40 mg at bedtime and she was maintained on pantoprazole in the morning.  She says she is not doing well.  She has been having horrible heartburn when her stomach is empty.  Therefore she always carries crackers with her.  She has tried esomeprazole in the past and it worked well but was not covered by her insurance her co-pay was too high. She also complains of dysphagia with solids.  She feels as if she has developed pocket in her esophagus.  She has 1-2 episodes per week.  She feels midsternal area sort of bolus obstruction.  She gets relief after she starts to regurgitate and vomit.  She has raised head end of bed by placing a block and she also uses 1 pillow to sleep.  She denies hematemesis.  She denies hoarseness chronic cough or sore throat.  She says she is watching her diet and trying to lose weight but unable to do so.  No history of recent antibiotic use. Her bowels move daily.  She denies melena or rectal bleeding.  Current Medications: Outpatient Encounter Medications as of 10/13/2020  Medication Sig   acetaminophen (TYLENOL) 325 MG tablet Take 325 mg by mouth every 6 (six) hours as needed for mild pain (For pain.).    amLODipine (NORVASC) 2.5 MG tablet Take 1 tablet (2.5 mg total) by mouth 2 (two) times daily.   atorvastatin (LIPITOR) 20 MG tablet Take 1 tablet (20 mg total) by mouth daily.   famotidine (PEPCID) 40 MG tablet TAKE 1 TABLET BY MOUTH AT  BEDTIME   ipratropium (ATROVENT) 0.06 % nasal spray Place 2 sprays into both nostrils 2 (two) times daily.   levothyroxine (SYNTHROID) 50 MCG tablet Take 1  tablet (50 mcg total) by mouth daily.   losartan (COZAAR) 100 MG tablet Take 1 tablet (100 mg total) by mouth daily.   pantoprazole (PROTONIX) 40 MG tablet TAKE 1 TABLET BY MOUTH  DAILY BEFORE BREAKFAST   sertraline (ZOLOFT) 100 MG tablet Take 1 tablet (100 mg total) by mouth at bedtime.   SUMAtriptan (IMITREX) 100 MG tablet TAKE 1 TABLET BY MOUTH AS  NEEDED FOR MIGRAINE. MAY  REPEAT IN 2 HOURS IF  HEADACHE PERSISTS OR  RECURS.   VITAMIN D-VITAMIN K PO Take 5,000 Units by mouth every other day.   No facility-administered encounter medications on file as of 10/13/2020.     Objective: Blood pressure (!) 147/74, pulse 91, temperature 98.9 F (37.2 C), temperature source Oral, height 5' 7" (1.702 m), weight 188 lb 6.4 oz (85.5 kg). Patient is alert and in no acute distress. She is wearing a mask. Conjunctiva is pink. Sclera is nonicteric Oropharyngeal mucosa is normal. Dentition in satisfactory condition. No neck masses or thyromegaly noted. Cardiac exam with regular rhythm normal S1 and S2. No murmur or gallop noted. Lungs are clear to auscultation. Abdomen is full but soft and nontender with organomegaly or masses. No LE edema or clubbing noted.  Labs/studies Results:   CBC Latest Ref Rng & Units 03/18/2020 09/27/2018 01/29/2018  WBC 4.0 - 10.5 K/uL 5.1 6.6 4.6  Hemoglobin 12.0 - 15.0 g/dL 12.2 11.1(L) 12.2  Hematocrit   36.0 - 46.0 % 37.0 35.6(L) 36.7  Platelets 150.0 - 400.0 K/uL 193.0 173 220.0    CMP Latest Ref Rng & Units 10/02/2020 10/02/2020 03/18/2020  Glucose 65 - 99 mg/dL 90 - 86  BUN 7 - 25 mg/dL 10 - 12  Creatinine 0.60 - 0.88 mg/dL 0.87 - 0.99  Sodium 135 - 146 mmol/L 138 - 138  Potassium 3.5 - 5.3 mmol/L 4.1 - 4.2  Chloride 98 - 110 mmol/L 104 - 104  CO2 20 - 32 mmol/L 26 - 30  Calcium 8.6 - 10.4 mg/dL 9.3 9.3 9.4  Total Protein 6.1 - 8.1 g/dL 6.2 - 6.1  Total Bilirubin 0.2 - 1.2 mg/dL 0.4 - 0.5  Alkaline Phos 39 - 117 U/L - - 74  AST 10 - 35 U/L 13 - 11  ALT 6 - 29  U/L 10 - 11    Hepatic Function Latest Ref Rng & Units 10/02/2020 03/18/2020 09/16/2019  Total Protein 6.1 - 8.1 g/dL 6.2 6.1 6.3  Albumin 3.5 - 5.2 g/dL - 4.0 4.2  AST 10 - 35 U/L 13 11 13  ALT 6 - 29 U/L 10 11 11  Alk Phosphatase 39 - 117 U/L - 74 83  Total Bilirubin 0.2 - 1.2 mg/dL 0.4 0.5 0.4  Bilirubin, Direct 0.0 - 0.3 mg/dL - - 0.1    Lab data reviewed.  Barium study on 10/04/2018 Images reviewed with patient.  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  Scattered secondary and tertiary waves noted.  Moderate size hiatal hernia.  Barium pill passes from oral cavity stomach without any delay. Moderate GE reflux noted to mid thoracic esophagus when patient was supine.  Assessment:  #1.  Esophageal dysphagia.  She had barium study 2 years ago which suggested esophageal dysmotility.  It remains to be seen if she has developed stricture or esophageal diverticulum.  Need to repeat barium study before considering EGD.  #2.  Chronic GERD.  Heartburn not well controlled with combination of pantoprazole and famotidine.  She has recurrence of hiatal hernia.  She had antireflux surgery twice and unfortunately failed.  At least hernia is not as large as it was prior to surgery.  However surgery did not provide any relief whatsoever.  We will try her on a different PPI.  Plan:  Continue famotidine 40 mg at bedtime. Discontinue pantoprazole. Begin esomeprazole 40 mg by mouth 30 minutes before breakfast daily.  Prescription given for 90 days with 3 refills. Barium pill esophagogram. Further recommendations to follow.       

## 2020-10-19 ENCOUNTER — Telehealth (INDEPENDENT_AMBULATORY_CARE_PROVIDER_SITE_OTHER): Payer: Self-pay | Admitting: Internal Medicine

## 2020-10-19 NOTE — Telephone Encounter (Signed)
Patient recently seen by Dr. Laural Golden on 10/13/2020 .I spoke with the patient and she states she has noticed on Saturday 10/17/2020 that she has some dark stools. She has not seen any bright red blood, she has no fevers nor any abdominal pains. She is not taking any oral fe. She states she was feeling dizzy at the office visit on 10/13/2020, which has continued some. She is not having any shortness of breath.She states the only medication change has been that she was started on Nexium, which has helped so much with the acid. I advised that I would make Dr. Laural Golden aware of this and if she develops any worsening symptoms to go to the nearest ED. Patient states understanding.

## 2020-10-19 NOTE — Telephone Encounter (Signed)
Patient called the office stated she was just seen on 7/5 - would like to let Dr Laural Golden know she is having black stools - please advise - ph# [phone]

## 2020-10-21 NOTE — Telephone Encounter (Signed)
I spoke with Dr. Laural Golden whom wanted the patient to do the hemo cult stool cards x 1 if negative repeat x 1. Patient aware of all and I have mailed her the two hemo cult stool cards with instructions that if I call her with a negative stool card she will need to preform the second one to confirm the test is in fact negative. I have mailed the cards on 10/21/2020, and placed a note for myself once the cards return to the office.

## 2020-10-23 ENCOUNTER — Ambulatory Visit (HOSPITAL_COMMUNITY): Payer: Medicare Other

## 2020-10-26 ENCOUNTER — Ambulatory Visit (HOSPITAL_COMMUNITY)
Admission: RE | Admit: 2020-10-26 | Discharge: 2020-10-26 | Disposition: A | Discharge status: Home / Self Care | Payer: Medicare Other | Source: Ambulatory Visit | Attending: Internal Medicine | Admitting: Internal Medicine

## 2020-10-26 ENCOUNTER — Other Ambulatory Visit: Payer: Self-pay

## 2020-10-26 DIAGNOSIS — K219 Gastro-esophageal reflux disease without esophagitis: Secondary | ICD-10-CM | POA: Diagnosis not present

## 2020-10-26 DIAGNOSIS — R1319 Other dysphagia: Secondary | ICD-10-CM | POA: Diagnosis not present

## 2020-10-26 DIAGNOSIS — R1310 Dysphagia, unspecified: Secondary | ICD-10-CM | POA: Diagnosis not present

## 2020-10-27 ENCOUNTER — Other Ambulatory Visit (INDEPENDENT_AMBULATORY_CARE_PROVIDER_SITE_OTHER): Payer: Self-pay | Admitting: Internal Medicine

## 2020-10-27 DIAGNOSIS — K921 Melena: Secondary | ICD-10-CM

## 2020-10-28 DIAGNOSIS — K921 Melena: Secondary | ICD-10-CM | POA: Diagnosis not present

## 2020-10-30 ENCOUNTER — Telehealth (INDEPENDENT_AMBULATORY_CARE_PROVIDER_SITE_OTHER): Payer: Self-pay | Admitting: *Deleted

## 2020-10-30 NOTE — Telephone Encounter (Signed)
   Diagnosis:    Result(s)   Card 1: Negative:         Completed by: Marisa Cyphers   HEMOCCULT SENSA DEVELOPER:   LOT#[id number]  EXPIRATION DATE: 06/24   HEMOCCULT SENSA CARD:     LOT#[id number]  EXPIRATION DATE: 02/24   CARD CONTROL RESULTS:   POSITIVE: + NEGATIVE: -    ADDITIONAL COMMENTS:  spoke to patient and let her know the hemocult card was negative for blood and advised her to do the second card and return it to the office.

## 2020-11-02 ENCOUNTER — Telehealth (INDEPENDENT_AMBULATORY_CARE_PROVIDER_SITE_OTHER): Payer: Self-pay

## 2020-11-02 ENCOUNTER — Other Ambulatory Visit: Payer: Self-pay

## 2020-11-02 ENCOUNTER — Telehealth: Payer: Self-pay

## 2020-11-02 ENCOUNTER — Encounter (HOSPITAL_COMMUNITY): Payer: Self-pay | Admitting: *Deleted

## 2020-11-02 ENCOUNTER — Emergency Department (HOSPITAL_COMMUNITY)
Admission: EM | Admit: 2020-11-02 | Discharge: 2020-11-02 | Disposition: A | Discharge status: Home / Self Care | Payer: Medicare Other | Attending: Emergency Medicine | Admitting: Emergency Medicine

## 2020-11-02 DIAGNOSIS — R42 Dizziness and giddiness: Secondary | ICD-10-CM | POA: Insufficient documentation

## 2020-11-02 DIAGNOSIS — R0602 Shortness of breath: Secondary | ICD-10-CM

## 2020-11-02 DIAGNOSIS — Z5321 Procedure and treatment not carried out due to patient leaving prior to being seen by health care provider: Secondary | ICD-10-CM | POA: Insufficient documentation

## 2020-11-02 DIAGNOSIS — I10 Essential (primary) hypertension: Secondary | ICD-10-CM

## 2020-11-02 NOTE — ED Triage Notes (Signed)
Been dizzy for 2 weeks with movement; pt states she had her blood done in the office and it was normal; denies chest pain; pt states she has felt like this before when her ulcers were bleeding, but denies having black stool.

## 2020-11-02 NOTE — Telephone Encounter (Signed)
Patient called today to see what her hgb from 10/28/2020 was as she states she has been having some dizziness with standing, shortness of breath on exertion. I advised that her hgb was 11.7 and Hct 35.5 on 10/28/2020 and should not be causing her symptoms. I advised that since she says the dizziness was with standing to call her pcp as this may have something to do with her blood pressure. I advised I would notify Dr.Rehman and if any further recommendations we would call her. Patient states understanding.

## 2020-11-02 NOTE — Telephone Encounter (Signed)
Leigh ann would you mind scheduling this and calling patient with the date and time. Thanks  Dr. Laural Golden aware of all and wants the patient to have a EGD.

## 2020-11-02 NOTE — ED Notes (Signed)
Called to room pt no answer 

## 2020-11-02 NOTE — Telephone Encounter (Signed)
Pt went to ED

## 2020-11-02 NOTE — Telephone Encounter (Signed)
FYI see previous notes - patient was directed to go to [facility] ED

## 2020-11-02 NOTE — Telephone Encounter (Signed)
[facility] - Client TELEPHONE ADVICE RECORD [facility] Patient Name: Debra Sanchez Gender: Female DOB: 02/22/1941 Age: 80 Y 65 M 27 D Return Phone Number: [phone] (Primary), [phone] (Secondary) Address: City/ State/ Zip: [place] [address] Client [facility] - Client Client Site [place] - Day Physician Raoul Pitch, [place] Contact Type Call Who Is Calling Patient / Member / Family / Caregiver Call Type Triage / Clinical Relationship To Patient Self Return Phone Number [phone] (Primary) Chief Complaint BREATHING - shortness of breath or sounds breathless Reason for Call Symptomatic / Request for Impact states she is calling from the office, and she had a voicemail from a pt that she wanted a referral to a cardiologist. She called her back and now the pt is on the backline. She has been experiencing chest pain and SOB. This has been happening for a couple of weeks now. [place] Not Listed UC in [place] Translation No Nurse Assessment Nurse: Harvie Bridge, RN, Beth Date/Time (Eastern Time): 11/02/2020 2:26:53 PM Confirm and document reason for call. If symptomatic, describe symptoms. ---Pt experiencing SOB. This has been happening for a couple of weeks now. Pt is Denies CP at this time. Having Dizzy for 2 weeks. SOB. 127/65 BP Does the patient have any new or worsening symptoms? ---Yes Will a triage be completed? ---Yes Related visit to physician within the last 2 weeks? ---No Does the PT have any chronic conditions? (i.e. diabetes, asthma, this includes High risk factors for pregnancy, etc.) ---Yes List chronic conditions. ---Bleeding Ulcers Is this a behavioral health or substance abuse call? ---No Guidelines Guideline Title Affirmed Question Affirmed Notes Nurse Date/Time (Eastern Time) Breathing Difficulty [1] MILD difficulty breathing  (e.g., minimal/no SOB at rest, SOB with walking, pulse <100) Newhart, RN, Beth 11/02/2020 2:29:03 PM PLEASE NOTE: All timestamps contained within this report are represented as [place] Standard Time. CONFIDENTIALTY NOTICE: This fax transmission is intended only for the addressee. It contains information that is legally privileged, confidential or otherwise protected from use or disclosure. If you are not the intended recipient, you are strictly prohibited from reviewing, disclosing, copying using or disseminating any of this information or taking any action in reliance on or regarding this information. If you have received this fax in error, please notify us immediately by telephone so that we can arrange for its return to [place]. Phone: [phone], Toll-Free: [phone], Fax: [phone] Page: 2 of 2 Call Id: [id number] Guidelines Guideline Title Affirmed Question Affirmed Notes Nurse Date/Time Eilene Ghazi Time) AND [2] NEW-onset or WORSE than normal Disp. Time Eilene Ghazi Time) Disposition Final User 11/02/2020 2:25:15 PM Send to Urgent Queue Susette Racer 11/02/2020 2:32:06 PM See HCP within 4 Hours (or PCP triage) Yes Newhart, RN, Beth Caller Disagree/Comply Comply Caller Understands Yes PreDisposition Did not know what to do Care Advice Given Per Guideline SEE HCP (OR PCP TRIAGE) WITHIN 4 HOURS: * IF OFFICE WILL BE CLOSED AND PCP SECOND-LEVEL TRIAGE REQUIRED: You may need to be seen. Your doctor (or NP/PA) will want to talk with you to decide what's best. I'll page the oncall provider now. If you haven't heard from the provider (or me) within 30 minutes, call again. NOTE: If on-call provider can't be reached, send to [facility] or ED. CALL BACK IF: * You become worse CARE ADVICE given per Breathing Difficulty (Adult) guideline. Referrals GO TO FACILITY OTHER - SPECIFY

## 2020-11-02 NOTE — Telephone Encounter (Signed)
Retrieved voicemail message today from patient requesting a referral to her cardiologist Dr. Harl Bowie because she was experiencing shortness of breath and dizziness, and she stated it was not due to her blood pressure.   Left this number to call back [phone]. I called patient and told her I needed her to speak with a triage nurse regarding her symptoms.  She said okay.  Call was transferred to [facility] [phone]

## 2020-11-03 NOTE — Telephone Encounter (Signed)
Pt aware of provider's instructions.

## 2020-11-03 NOTE — Telephone Encounter (Signed)
Okay to place referral.  If her symptoms worsen before she can get in she should come in to be seen for this condition.   Documentation only: She left the ED prior to receiving treatment.

## 2020-11-03 NOTE — Telephone Encounter (Signed)
Pt requesting cardiology referral

## 2020-11-04 NOTE — Addendum Note (Signed)
Addended by: Kavin Leech on: 11/04/2020 08:18 AM   Modules accepted: Orders

## 2020-11-04 NOTE — Telephone Encounter (Signed)
Referral placed.

## 2020-11-09 DIAGNOSIS — R0602 Shortness of breath: Secondary | ICD-10-CM | POA: Diagnosis not present

## 2020-11-09 DIAGNOSIS — R5383 Other fatigue: Secondary | ICD-10-CM | POA: Diagnosis not present

## 2020-11-09 DIAGNOSIS — B349 Viral infection, unspecified: Secondary | ICD-10-CM | POA: Diagnosis not present

## 2020-11-09 DIAGNOSIS — R42 Dizziness and giddiness: Secondary | ICD-10-CM | POA: Diagnosis not present

## 2020-11-10 NOTE — Telephone Encounter (Signed)
Stool is guaiac negative Patient is aware.

## 2020-11-10 NOTE — Telephone Encounter (Signed)
Will continue to monitor patient's symptoms. She will bring [place] another stool sample if stool is black.

## 2020-11-11 ENCOUNTER — Ambulatory Visit (INDEPENDENT_AMBULATORY_CARE_PROVIDER_SITE_OTHER): Payer: Medicare Other | Admitting: Family Medicine

## 2020-11-11 ENCOUNTER — Encounter: Payer: Self-pay | Admitting: Family Medicine

## 2020-11-11 ENCOUNTER — Other Ambulatory Visit: Payer: Self-pay

## 2020-11-11 VITALS — BP 137/75 | HR 77 | Temp 98.1°F | Ht 67.0 in | Wt 189.0 lb

## 2020-11-11 DIAGNOSIS — H8111 Benign paroxysmal vertigo, right ear: Secondary | ICD-10-CM

## 2020-11-11 MED ORDER — DICLOFENAC SODIUM 75 MG PO TBEC: 75.0000 mg | DELAYED_RELEASE_TABLET | Freq: Two times a day (BID) | ORAL | 1 refills | Status: DC

## 2020-11-11 NOTE — Progress Notes (Signed)
This visit occurred during the SARS-CoV-2 public health emergency.  Safety protocols were in place, including screening questions prior to the visit, additional usage of staff PPE, and extensive cleaning of exam room while observing appropriate contact time as indicated for disinfecting solutions.    Debra Sanchez , [birth year], 80 y.o., female MRN: [id number] Patient Care Team    Relationship Specialty Notifications Start End  Ma Hillock, DO PCP - General Family Medicine  04/20/15     Chief Complaint  Patient presents with   Dizziness    Pt c/o dizziness (rooming spinning) this morning while laying and continued after standing x 1 mo; Pt was seen in [place] in [place] was tx for vertigo;      Subjective: Pt presents for an OV with complaints of feeling "unsteady" or "dizzy" when she turns in bed (right > left) and when laying flat to standing. She denies lightheadedness or feeling as if she was to pass out. She denies syncope. Symptoms last a few seconds. This has been occurring intermittently over the last 4 weeks. She was seen in the UC earlier this week and  prescribed steroids and ativert, which she does not feel have helped.  She has noticed all her joint aches have resolved with the use of the steroids. She is wondering if there is anything she can take that will help with joint pain/arthritis like the steroid.  She has a h/o GERD and not able to tolerate NSAIDS on her chart. She reports this is from her hiatal hernia and usually with higher doses of advil etc.  Depression screen [facility] 2/9 03/18/2020 09/16/2019 03/18/2019 10/22/2018 01/29/2018  Decreased Interest 0 0 0 0 0  Down, Depressed, Hopeless 0 0 0 0 0  PHQ - 2 Score 0 0 0 0 0  Altered sleeping - - - - 1  Tired, decreased energy - - - - 1  Change in appetite - - - - 0  Feeling bad or failure about yourself  - - - - 0  Trouble concentrating - - - - 0  Moving slowly or fidgety/restless - - - - 0  Suicidal thoughts - - - - 0   PHQ-9 Score - - - - 2    Allergies  Allergen Reactions   Hydromorphone Itching, Nausea Only and Other (See Comments)    After IV dilaudid pt flushed and felt dizzy   Ciprofloxacin Other (See Comments)    Stomach problems   Codeine Itching   Latex Other (See Comments)    fainted   Morphine And Related Itching   Nsaids Nausea And Vomiting   Penicillins Itching and Nausea And Vomiting    Has patient had a PCN reaction causing immediate rash, facial/tongue/throat swelling, SOB or lightheadedness with hypotension: No Has patient had a PCN reaction causing severe rash involving mucus membranes or skin necrosis: No Has patient had a PCN reaction that required hospitalization No Has patient had a PCN reaction occurring within the last 10 years: Yes If all of the above answers are "NO", then may proceed with Cephalosporin use.    Sulfa Antibiotics Nausea And Vomiting   Social History   Social History Narrative   Married to [employer].   2 children Kerin Ransom and Anne Ng)    Retired, 2 yrs college.    Caffeine beverages.   Takes a daily vitamin.   Wears her seatbelt. Smoke detector in the home.    Feels safe in her relationships.  Past Medical History:  Diagnosis Date   Anemia    Anxiety    Arthritis    Blood transfusion 2012   Colon polyp    DDD (degenerative disc disease), thoracolumbar    Degenerative disc disease, lumbar    Family history of adverse reaction to anesthesia    pts sister got very sick / burnt esophagus    Gastric ulcer    GERD (gastroesophageal reflux disease)    Heart murmur    History of hiatal hernia    Hypertension    Hypothyroidism    Migraines    Miscarriage    Osteoporosis    Pneumonia yrs ago   Syncope 2012   Wears glasses    Past Surgical History:  Procedure Laterality Date   ABDOMINAL HYSTERECTOMY     ANTERIOR CERVICAL DECOMP/DISCECTOMY FUSION     C5-6 and C6-7 anterior cervical diskectomy and fusion     BILATERAL OOPHORECTOMY     CATARACT EXTRACTION W/PHACO Left 10/20/2014   Procedure: CATARACT EXTRACTION PHACO AND INTRAOCULAR LENS PLACEMENT LEFT EYE;  Surgeon: Tonny Branch, MD;  Location: [facility];  Service: Ophthalmology;  Laterality: Left;  CDE:6.98   CATARACT EXTRACTION W/PHACO Right 11/17/2014   Procedure: CATARACT EXTRACTION PHACO AND INTRAOCULAR LENS PLACEMENT RIGHT EYE CDE=6.57;  Surgeon: Tonny Branch, MD;  Location: [facility];  Service: Ophthalmology;  Laterality: Right;   COLONOSCOPY  01/19/2011   Procedure: COLONOSCOPY;  Surgeon: Rogene Houston, MD;  Location: [facility];  Service: Endoscopy;  Laterality: N/A;  2:00   DILATION AND CURETTAGE OF UTERUS     ESOPHAGOGASTRODUODENOSCOPY  11/05/2010   Procedure: ESOPHAGOGASTRODUODENOSCOPY (EGD);  Surgeon: Rogene Houston, MD;  [facility]: [facility];  Service: Endoscopy;  Laterality: N/A;  7:00 am per Melanie   ESOPHAGOGASTRODUODENOSCOPY N/A 07/25/2012   Procedure: ESOPHAGOGASTRODUODENOSCOPY (EGD);  Surgeon: Rogene Houston, MD;  [facility]: [facility];  Service: Endoscopy;  Laterality: N/A;  400-moved to 11 Ann notified pt   ESOPHAGOGASTRODUODENOSCOPY N/A 05/27/2015   Procedure: ESOPHAGOGASTRODUODENOSCOPY (EGD);  Surgeon: Rogene Houston, MD;  [facility]: [facility];  Service: Endoscopy;  Laterality: N/A;  2:00   HIATAL HERNIA REPAIR N/A 08/25/2015   Procedure: LAPAROSCOPIC REPAIR OF LARGE TYPE III  HIATAL HERNIA;  Surgeon: Johnathan Hausen, MD;  Location: [facility];  Service: General;  Laterality: N/A;   LAPAROSCOPIC NISSEN FUNDOPLICATION N/A [id number]   Procedure: ENDOSCOPY, HIATAL HERNIA REPAIR, AND TAKE DOWN OF NISSEN FUNDOPILICATION;  Surgeon: Johnathan Hausen, MD;  Location: [facility];  Service: General;  Laterality: N/A;   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     Family History  Problem Relation Age of Onset   Colon cancer Mother    Heart disease Mother    Arthritis Mother    Heart disease  Father    Arthritis Father    Arthritis Sister    Arthritis Brother    Lung cancer Brother        smoker   Arthritis Maternal Aunt    Arthritis Maternal Uncle    Diabetes Maternal Uncle    Allergies as of 11/11/2020       Reactions   Hydromorphone Itching, Nausea Only, Other (See Comments)   After IV dilaudid pt flushed and felt dizzy   Ciprofloxacin Other (See Comments)   Stomach problems   Codeine Itching   Latex Other (See Comments)   fainted   Morphine And Related Itching  Nsaids Nausea And Vomiting   Penicillins Itching, Nausea And Vomiting   Has patient had a PCN reaction causing immediate rash, facial/tongue/throat swelling, SOB or lightheadedness with hypotension: No Has patient had a PCN reaction causing severe rash involving mucus membranes or skin necrosis: No Has patient had a PCN reaction that required hospitalization No Has patient had a PCN reaction occurring within the last 10 years: Yes If all of the above answers are "NO", then may proceed with Cephalosporin use.   Sulfa Antibiotics Nausea And Vomiting        Medication List        Accurate as of November 11, 2020 10:27 AM. If you have any questions, ask your nurse or doctor.          acetaminophen 325 MG tablet Commonly known as: TYLENOL Take 325 mg by mouth every 6 (six) hours as needed for mild pain (For pain.).   albuterol 108 (90 Base) MCG/ACT inhaler Commonly known as: VENTOLIN HFA SMARTSIG:2 Puff(s) By Mouth Every 6 Hours PRN   amLODipine 2.5 MG tablet Commonly known as: NORVASC Take 1 tablet (2.5 mg total) by mouth 2 (two) times daily.   atorvastatin 20 MG tablet Commonly known as: LIPITOR Take 1 tablet (20 mg total) by mouth daily.   esomeprazole 40 MG capsule Commonly known as: NexIUM Take 1 capsule (40 mg total) by mouth daily before breakfast.   famotidine 40 MG tablet Commonly known as: PEPCID TAKE 1 TABLET BY MOUTH AT  BEDTIME   ipratropium 0.06 % nasal spray Commonly  known as: ATROVENT Place 2 sprays into both nostrils 2 (two) times daily.   levothyroxine 50 MCG tablet Commonly known as: SYNTHROID Take 1 tablet (50 mcg total) by mouth daily.   losartan 100 MG tablet Commonly known as: COZAAR Take 1 tablet (100 mg total) by mouth daily.   meclizine 25 MG tablet Commonly known as: ANTIVERT Take 25 mg by mouth 3 (three) times daily as needed.   predniSONE 5 MG tablet Commonly known as: DELTASONE Take by mouth.   sertraline 100 MG tablet Commonly known as: ZOLOFT Take 1 tablet (100 mg total) by mouth at bedtime.   SUMAtriptan 100 MG tablet Commonly known as: IMITREX TAKE 1 TABLET BY MOUTH AS  NEEDED FOR MIGRAINE. MAY  REPEAT IN 2 HOURS IF  HEADACHE PERSISTS OR  RECURS.   VITAMIN D-VITAMIN K PO Take 5,000 Units by mouth every other day.        All past medical history, surgical history, allergies, family history, immunizations andmedications were updated in the EMR today and reviewed under the history and medication portions of their EMR.     ROS: Negative, with the exception of above mentioned in HPI   Objective:  BP 137/75 (Patient Position: Standing)   Pulse 77   Temp 98.1 F (36.7 C) (Oral)   Ht '5\' 7"'$  (1.702 m)   Wt 189 lb (85.7 kg)   SpO2 98%   BMI 29.60 kg/m  Body mass index is 29.6 kg/m. Gen: Afebrile. No acute distress. Nontoxic in appearance, well developed, well nourished.  HENT: AT. [place]. Bilateral TM visualized without erythema or effusions. MMM, no oral lesions. Bilateral nares with mild erythema, no drainage. Throat without erythema or exudates.  Eyes:Pupils Equal Round Reactive to light, Extraocular movements intact,  Conjunctiva without redness, discharge or icterus. Neck/lymp/endocrine: Supple,no lymphadenopathy Skin: no rashes, purpura or petechiae.  Neuro: Normal gait. PERLA. EOMi. Alert. Oriented x3  Dix hallpike: Positive right for nystagmus  horizontal  and reproduction of symptoms.  Psych: Normal affect,  dress and demeanor. Normal speech. Normal thought content and judgment.  No results found. No results found. No results found for this or any previous visit (from the past 24 hour(s)).  Assessment/Plan: NINJA SIGURDSON is a 80 y.o. female present for OV for  Benign paroxysmal positional vertigo of right ear Discussed BPPV with patient today. She does not need a cardiology referral for this episode.  But she would like to establish just for cardiac health given her family history.  Referral was placed 7/27. Dix-Hallpike maneuver was positive for right sided nystagmus.  We discussed vestibular rehab referral and Epley maneuver.  She would like to first try Epley maneuver at home and take Flonase nasal spray.  Osteoarthritis/pain: We discussed different NSAIDs that could be prescribed to help with her arthritis.  I would not recommend chronic steroids for her, and she agrees.  She has a rather significant history of GERD which could affect the use of NSAIDs.  She reports understanding. Diclofenac 75 mg in the evening with meals, can take twice daily if tolerating. Will need to follow-up every 5 and half months with other chronic conditions.  Reviewed expectations re: course of current medical issues. Discussed self-management of symptoms. Outlined signs and symptoms indicating need for more acute intervention. Patient verbalized understanding and all questions were answered. Patient received an After-Visit Summary.    No orders of the defined types were placed in this encounter.  No orders of the defined types were placed in this encounter.  Referral Orders  No referral(s) requested today     Note is dictated utilizing voice recognition software. Although note has been proof read prior to signing, occasional typographical errors still can be missed. If any questions arise, please do not hesitate to call for verification.   electronically signed by:  Howard Pouch, DO  [place]

## 2020-11-11 NOTE — Patient Instructions (Signed)
Benign Positional Vertigo Vertigo is the feeling that you or your surroundings are moving when they are not. Benign positional vertigo is the most common form of vertigo. This is usually a harmless condition (benign). This condition is positional. This means that symptoms are triggered bycertain movements and positions. This condition can be dangerous if it occurs while you are doing something that could cause harm to yourself or others. This includes activities such asdriving or operating machinery. What are the causes? The inner ear has fluid-filled canals that help your brain sense movement and balance. When the fluid moves, the brain receives messages about your body'sposition. With benign positional vertigo, calcium crystals in the inner ear break free and disturb the inner ear area. This causes your brain to receive confusingmessages about your body's position. What increases the risk? You are more likely to develop this condition if: You are a woman. You are 31 years of age or older. You have recently had a head injury. You have an inner ear disease. What are the signs or symptoms? Symptoms of this condition usually happen when you move your head or your eyes in different directions. Symptoms may start suddenly and usually last for less than a minute. They include: Loss of balance and falling. Feeling like you are spinning or moving. Feeling like your surroundings are spinning or moving. Nausea and vomiting. Blurred vision. Dizziness. Involuntary eye movement (nystagmus). Symptoms can be mild and cause only minor problems, or they can be severe and interfere with daily life. Episodes of benign positional vertigo may return (recur) over time. Symptoms may also improve over time. How is this diagnosed? This condition may be diagnosed based on: Your medical history. A physical exam of the head, neck, and ears. Positional tests to check for or stimulate vertigo. You may be asked to turn  your head and change positions, such as going from sitting to lying down. A health care provider will watch for symptoms of vertigo. You may be referred to a health care provider who specializes in ear, nose, and throat problems (ENT or otolaryngologist) or a provider who specializes in disorders of the nervous system (neurologist). How is this treated?  This condition may be treated in a session in which your health care provider moves your head in specific positions to help the displaced crystals in your inner ear move. Treatment for this condition may take several sessions. Surgerymay be needed in severe cases, but this is rare. In some cases, benign positional vertigo may resolve on its own in 2-4 weeks. Follow these instructions at home: Safety Move slowly. Avoid sudden body or head movements or certain positions, as told by your health care provider. Avoid driving or operating machinery until your health care provider says it is safe. Avoid doing any tasks that would be dangerous to you or others if vertigo occurs. If you have trouble walking or keeping your balance, try using a cane for stability. If you feel dizzy or unstable, sit down right away. Return to your normal activities as told by your health care provider. Ask your health care provider what activities are safe for you. General instructions Take over-the-counter and prescription medicines only as told by your health care provider. Drink enough fluid to keep your urine pale yellow. Keep all follow-up visits. This is important. Contact a health care provider if: You have a fever. Your condition gets worse or you develop new symptoms. Your family or friends notice any behavioral changes. You have nausea or vomiting  that gets worse. You have numbness or a prickling and tingling sensation. Get help right away if you: Have difficulty speaking or moving. Are always dizzy or faint. Develop severe headaches. Have weakness in your  legs or arms. Have changes in your hearing or vision. Develop a stiff neck. Develop sensitivity to light. These symptoms may represent a serious problem that is an emergency. Do not wait to see if the symptoms will go away. Get medical help right away. Call your local emergency services (911 in the [place] Do not drive yourself to the hospital. Summary Vertigo is the feeling that you or your surroundings are moving when they are not. Benign positional vertigo is the most common form of vertigo. This condition is caused by calcium crystals in the inner ear that become displaced. This causes a disturbance in an area of the inner ear that helps your brain sense movement and balance. Symptoms include loss of balance and falling, feeling that you or your surroundings are moving, nausea and vomiting, and blurred vision. This condition can be diagnosed based on symptoms, a physical exam, and positional tests. Follow safety instructions as told by your health care provider and keep all follow-up visits. This is important. This information is not intended to replace advice given to you by your health care provider. Make sure you discuss any questions you have with your healthcare provider. How to Perform the Epley Maneuver The Epley maneuver is an exercise that relieves symptoms of vertigo. Vertigo is the feeling that you or your surroundings are moving when they are not. When you feel vertigo, you may feel like the room is spinning and may have trouble walking. The Epley maneuver is used for a type of vertigo caused by a calcium deposit in a part of the inner ear. The maneuver involves changing headpositions to help the deposit move out of the area. You can do this maneuver at home whenever you have symptoms of vertigo. You canrepeat it in 24 hours if your vertigo has not gone away. Even though the Epley maneuver may relieve your vertigo for a few weeks, it is possible that your symptoms will return. This  maneuver relieves vertigo, but itdoes not relieve dizziness. What are the risks? If it is done correctly, the Epley maneuver is considered safe. Sometimes it can lead to dizziness or nausea that goes away after a short time. If you develop other symptoms--such as changes in vision, weakness, or numbness--stopdoing the maneuver and call your health care provider. Supplies needed: A bed or table. A pillow. How to do the Epley maneuver     Sit on the edge of a bed or table with your back straight and your legs extended or hanging over the edge of the bed or table. Turn your head halfway toward the affected ear or side as told by your health care provider. Lie backward quickly with your head turned until you are lying flat on your back. Your head should dangle (head-hanging position). You may want to position a pillow under your shoulders. Hold this position for at least 30 seconds. If you feel dizzy or have symptoms of vertigo, continue to hold the position until the symptoms stop. Turn your head to the opposite direction until your unaffected ear is facing down. Your head should continue to dangle. Hold this position for at least 30 seconds. If you feel dizzy or have symptoms of vertigo, continue to hold the position until the symptoms stop. Turn your whole body to the  same side as your head so that you are positioned on your side. Your head will now be nearly facedown and no longer needs to dangle. Hold for at least 30 seconds. If you feel dizzy or have symptoms of vertigo, continue to hold the position until the symptoms stop. Sit back up. You can repeat the maneuver in 24 hours if your vertigo does not go away. Follow these instructions at home: For 24 hours after doing the Epley maneuver: Keep your head in an upright position. When lying down to sleep or rest, keep your head raised (elevated) with two or more pillows. Avoid excessive neck movements. Activity Do not drive or use machinery  if you feel dizzy. After doing the Epley maneuver, return to your normal activities as told by your health care provider. Ask your health care provider what activities are safe for you. General instructions Drink enough fluid to keep your urine pale yellow. Do not drink alcohol. Take over-the-counter and prescription medicines only as told by your health care provider. Keep all follow-up visits. This is important. Preventing vertigo symptoms Ask your health care provider if there is anything you should do at home to prevent vertigo. He or she may recommend that you: Keep your head elevated with two or more pillows while you sleep. Do not sleep on the side of your affected ear. Get up slowly from bed. Avoid sudden movements during the day. Avoid extreme head positions or movement, such as looking up or bending over. Contact a health care provider if: Your vertigo gets worse. You have other symptoms, including: Nausea. Vomiting. Headache. Get help right away if you: Have vision changes. Have a headache or neck pain that is severe or getting worse. Cannot stop vomiting. Have new numbness or weakness in any part of your body. These symptoms may represent a serious problem that is an emergency. Do not wait to see if the symptoms will go away. Get medical help right away. Call your local emergency services (911 in the [place] Do not drive yourself to the hospital. Summary Vertigo is the feeling that you or your surroundings are moving when they are not. The Epley maneuver is an exercise that relieves symptoms of vertigo. If the Epley maneuver is done correctly, it is considered safe. This information is not intended to replace advice given to you by your health care provider. Make sure you discuss any questions you have with your healthcare provider. Document Revised: 02/26/2020 Document Reviewed: 02/26/2020 Elsevier Patient Education  2022 [place] Revised: 02/26/2020  Document Reviewed: 02/26/2020 Elsevier Patient Education  2022 [employer].

## 2020-11-12 ENCOUNTER — Ambulatory Visit (HOSPITAL_COMMUNITY)
Admission: RE | Admit: 2020-11-12 | Discharge: 2020-11-12 | Disposition: A | Discharge status: Home / Self Care | Payer: Medicare Other | Source: Ambulatory Visit | Attending: Family Medicine | Admitting: Family Medicine

## 2020-11-12 DIAGNOSIS — R413 Other amnesia: Secondary | ICD-10-CM | POA: Insufficient documentation

## 2020-11-12 MED ORDER — GADOBUTROL 1 MMOL/ML IV SOLN
10.0000 mL | Freq: Once | INTRAVENOUS | Status: AC | PRN
  Administered 2020-11-12: 10 mL via INTRAVENOUS

## 2020-11-13 ENCOUNTER — Telehealth (INDEPENDENT_AMBULATORY_CARE_PROVIDER_SITE_OTHER): Payer: Self-pay | Admitting: *Deleted

## 2020-11-13 NOTE — Telephone Encounter (Signed)
   Diagnosis: dark stools R19.5   Result(s)   Card 1: Negative:     Completed by: Marisa Cyphers LPN   HEMOCCULT SENSA DEVELOPER:  LOT#: [id number] EXPIRATION DATE: 10/22   HEMOCCULT SENSA CARD:    LOT#: [id number] EXPIRATION DATE: 02/24   CARD CONTROL RESULTS:    POSITIVE: + NEGATIVE: -    ADDITIONAL COMMENTS:  pt notified test was negative for blood.

## 2020-11-16 ENCOUNTER — Telehealth: Payer: Self-pay | Admitting: Family Medicine

## 2020-11-16 DIAGNOSIS — R519 Headache, unspecified: Secondary | ICD-10-CM

## 2020-11-16 DIAGNOSIS — R413 Other amnesia: Secondary | ICD-10-CM

## 2020-11-16 DIAGNOSIS — D329 Benign neoplasm of meninges, unspecified: Secondary | ICD-10-CM

## 2020-11-16 NOTE — Telephone Encounter (Signed)
Patient was contacted by this provider and MRI results discussed. She was having persistent headaches, that had resolved after blood pressure regimen altered. Most recently presented for mild memory changes and vertigo.    Mild microvascular ischemic disease. Small 8 mm right temporal meningioma present.  Findings were discussed with patient.  All of patient's questions were answered today.  Referral placed to  Dr. Merlene Laughter -neurology.  [place] location requested by patient.

## 2020-11-16 NOTE — Telephone Encounter (Signed)
Patient states [facility] needs additional information before filling her new prescription for her arthritis medication. Patient did not know what medication, what information they need or the phone number. Appears to be for voltaren. Please call pharmacy.

## 2020-11-16 NOTE — Telephone Encounter (Signed)
Spoke with pt and informed her that she will need to contact pharmacy to see what the info they are requesting. If they need anything from [place] they will send a fax. Could be a simple as needing billing address

## 2020-11-17 ENCOUNTER — Encounter: Payer: Self-pay | Admitting: Physician Assistant

## 2020-11-20 ENCOUNTER — Other Ambulatory Visit (HOSPITAL_COMMUNITY): Payer: Medicare Other

## 2020-11-26 NOTE — Progress Notes (Signed)
Assessment/Plan:   DAEGAN STROBLE is a 80 y.o. year old female with risk factors including  age, hypertension, hyperlipidemia, CAD, anxiety, depression and seen today for evaluation of memory loss.  MoCA today is 25/30 = MMSE 29/30  with  mild deficiencies naming 2/3, memory 4/5, delayed recall 4/5, orientation normal 6/6.  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 Patient declines neurocognitive testing at this time   Recommendations:   Memory Changes   Follow up in 6 months  Agree with sleep apnea studies, appt w Dr. Halford Chessman 12/03/20 Discussed safety both in and out of the home.  Discussed the importance of regular daily schedule with inclusion of crossword puzzles to maintain brain function.  Continue to monitor mood with PCP.  Stay active at least 30 minutes at least 3 times a week.  Naps should be scheduled and should be no longer than 60 minutes and should not occur after 2 PM.  Mediterranean diet is recommended  Follow up her incidental meningioma with MRI    Subjective:    The patient is seen in neurologic consultation at the request of Kuneff, Renee A, DO for the evaluation of memory.  The patient is here alone. She is a 80 yo RH woman who has noticed increased distraction and disorientation since June of this year, around the time she was dealing with significant home related stress. For example while driving, she would get to a stop and not know wether to turn right or left. She would be sitting in her living room and not know where the door to get out of the room was. She would be doing dishes and forget where the dishes were to be stored. She had in total about 10 events, most recently 1 week ago. They last about 2 seconds. She reports increased anxiety when this happens, and admits that she is "afraid of getting Alzheimer's".  Denies a history of  seizures or concussions. She denies LOC during those events. She states that her husband has been minimizing her experience,  stating " I get those too, this is old age". She denies any LTM or STM problems. No family history of dementia.  She reports feeling more irritable and anxious lately.  She has not been sleeping well, and feels tired during the day. She has a  scheduled sleep study on 8/25. Denies vivi dreams or sleepwalking. No hallucinations or paranoia. Denies leaving objects in unusual places. She is independent of dressing and bathing. She does not forget any medication doses or bill payments.  Her appetite is decreased, denies trouble swallowing, She denies leaving the stove on when cooking.  She denies any falls. She uses a GPS to commute. Denies headaches, used to have migraines when she was a teenager.. She denies double vision,  or dizziness. 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  She has urine incontinence pads.  Denies constipation or diarrhea. Denies anosmia. Denies history of  ETOH or Tobacco.     Labs 09/2020 TSH 1.72, T4 1.1, B12 510 and Vit D 71   MRI brain w and wo contrast 11/12/20 1. No acute intracranial abnormality.  2. 8 mm right temporal convexity meningioma.  3. Mild chronic small vessel ischemic disease.  4. Mild cerebral atrophy  is not considered advanced for age  and is without lobar predominance.   Allergies  Allergen Reactions   Hydromorphone Itching, Nausea Only and Other (See Comments)    After IV dilaudid pt flushed and felt dizzy   Ciprofloxacin Other (See Comments)    Stomach problems   Codeine Itching   Latex Other (See Comments)    fainted   Morphine And Related Itching   Nsaids Nausea And Vomiting   Penicillins Itching and Nausea And Vomiting    Has patient had a PCN reaction causing immediate rash, facial/tongue/throat swelling, SOB  or lightheadedness with hypotension: No Has patient had a PCN reaction causing severe rash involving mucus membranes or skin necrosis: No Has patient had a PCN reaction that required hospitalization No Has patient had a PCN reaction occurring within the last 10 years: Yes If all of the above answers are "NO", then may proceed with Cephalosporin use.    Sulfa Antibiotics Nausea And Vomiting    Current Outpatient Medications  Medication Instructions   acetaminophen (TYLENOL) 325 mg, Oral, Every 6 hours PRN   albuterol (VENTOLIN HFA) 108 (90 Base) MCG/ACT inhaler SMARTSIG:2 Puff(s) By Mouth Every 6 Hours PRN   amLODipine (NORVASC) 2.5 mg, Oral, 2 times daily   atorvastatin (LIPITOR) 20 mg, Oral, Daily   diclofenac (VOLTAREN) 75 mg, Oral, 2 times daily   esomeprazole (NEXIUM) 40 mg, Oral, Daily before breakfast   famotidine (PEPCID) 40 MG tablet TAKE 1 TABLET BY MOUTH AT  BEDTIME   ipratropium (ATROVENT) 0.06 % nasal spray 2 sprays, Each Nare, 2 times daily   levothyroxine (SYNTHROID) 50 mcg, Oral, Daily   losartan (COZAAR) 100 mg, Oral, Daily   meclizine (ANTIVERT) 25 mg, Oral, 3 times daily PRN   predniSONE (DELTASONE) 5 MG tablet Oral   pyridOXINE (VITAMIN B-6) 100 mg, Oral, Daily   sertraline (ZOLOFT) 100 mg, Oral, Daily at bedtime   vitamin B-12 (CYANOCOBALAMIN) 1,000 mcg, Oral, Daily   VITAMIN D-VITAMIN K PO 5,000 Units, Oral, Every other day     VITALS:   Vitals:   11/27/20 1255  BP: 118/72  Pulse: 77  SpO2: 97%  Weight: 191 lb 9.6 oz (86.9 kg)  Height: '5\' 8"'$  (1.727 m)     PHYSICAL EXAM   HEENT:  Normocephalic, atraumatic. The mucous membranes are moist. The superficial temporal arteries are without ropiness or tenderness. Cardiovascular: Regular rate and rhythm. Lungs: Clear to auscultation bilaterally. Neck: There are no carotid bruits noted bilaterally.  NEUROLOGICAL: Montreal Cognitive Assessment  11/27/2020  Visuospatial/ Executive (0/5) 4  Naming (0/3) 2   Attention: Read list of digits (0/2) 2  Attention: Read list of letters (0/1) 1  Attention: Serial 7 subtraction starting at 100 (0/3) 2  Language: Repeat phrase (0/2) 1  Language : Fluency (0/1) 1  Abstraction (0/2) 2  Delayed Recall (0/5) 4  Orientation (0/6) 6  Total 25  Adjusted Score (based on education) 25   No flowsheet data found.  No flowsheet data found.   Orientation:  Alert and oriented to person, place and time. No aphasia or dysarthria. Fund of knowledge is appropriate. Recent memory and remote memory intact.  Attention and concentration are normal.  Able to name objects and repeat phrases. Delayed recall 4  /5 Cranial nerves: There is good facial symmetry. Extraocular muscles are intact and visual fields are full to confrontational testing. Speech is fluent and clear. Soft palate rises symmetrically and there is no tongue deviation. Hearing is intact to conversational  tone. Tone: Tone is good throughout. Sensation: Sensation is intact to light touch and pinprick throughout. Vibration is intact at the bilateral big toe.There is no extinction with double simultaneous stimulation. There is no sensory dermatomal level identified. Coordination: The patient has no difficulty with RAM's or FNF bilaterally. Normal finger to nose  Motor: Strength is 5/5 in the bilateral upper and lower extremities. There is no pronator drift. There are no fasciculations noted. DTR's: Deep tendon reflexes are 2/4 at the bilateral biceps, triceps, brachioradialis, patella and achilles.  Plantar responses are downgoing bilaterally. Gait and Station: The patient is able to ambulate without difficulty.The patient is able to heel toe walk without any difficulty.The patient is able to ambulate in a tandem fashion. The patient is able to stand in the Romberg position.     Thank you for allowing [place] the opportunity to participate in the care of this nice patient. Please do not hesitate to contact us for any  questions or concerns.   Total time spent on today's visit was  50 minutes, including both face-to-face time and nonface-to-face time.  Time included that spent on review of records (prior notes available to me/labs/imaging if pertinent), discussing treatment and goals, answering patient's questions and coordinating care.  Cc:  Eduard Roux 11/27/2020 2:16 PM

## 2020-11-27 ENCOUNTER — Ambulatory Visit: Payer: Medicare Other | Admitting: Physician Assistant

## 2020-11-27 ENCOUNTER — Other Ambulatory Visit: Payer: Self-pay

## 2020-11-27 ENCOUNTER — Encounter: Payer: Self-pay | Admitting: Physician Assistant

## 2020-11-27 VITALS — BP 118/72 | HR 77 | Ht 68.0 in | Wt 191.6 lb

## 2020-11-27 DIAGNOSIS — R413 Other amnesia: Secondary | ICD-10-CM

## 2020-11-27 NOTE — Patient Instructions (Addendum)
It was a pleasure to see you today at our office.   Recommendations:  Follow up in 6 months  Follow up on a regular basis the MRI of the brain for your meningioma Make sure to get that sleep study    RECOMMENDATIONS FOR ALL PATIENTS WITH MEMORY PROBLEMS: 1. Continue to exercise (Recommend 30 minutes of walking everyday, or 3 hours every week) 2. Increase social interactions - continue going to [place] and enjoy social gatherings with friends and family 3. Eat healthy, avoid fried foods and eat more fruits and vegetables 4. Maintain adequate blood pressure, blood sugar, and blood cholesterol level. Reducing the risk of stroke and cardiovascular disease also helps promoting better memory. 5. Avoid stressful situations. Live a simple life and avoid aggravations. Organize your time and prepare for the next day in anticipation. 6. Sleep well, avoid any interruptions of sleep and avoid any distractions in the bedroom that may interfere with adequate sleep quality 7. Avoid sugar, avoid sweets as there is a strong link between excessive sugar intake, diabetes, and cognitive impairment We discussed the Mediterranean diet, which has been shown to help patients reduce the risk of progressive memory disorders and reduces cardiovascular risk. This includes eating fish, eat fruits and green leafy vegetables, nuts like almonds and hazelnuts, walnuts, and also use olive oil. Avoid fast foods and fried foods as much as possible. Avoid sweets and sugar as sugar use has been linked to worsening of memory function.  There is always a concern of gradual progression of memory problems. If this is the case, then we may need to adjust level of care according to patient needs. Support, both to the patient and caregiver, should then be put into place.   FALL PRECAUTIONS: Be cautious when walking. Scan the area for obstacles that may increase the risk of trips and falls. When getting up in the mornings, sit up at the edge  of the bed for a few minutes before getting out of bed. Consider elevating the bed at the head end to avoid drop of blood pressure when getting up. Walk always in a well-lit room (use night lights in the walls). Avoid area rugs or power cords from appliances in the middle of the walkways. Use a walker or a cane if necessary and consider physical therapy for balance exercise. Get your eyesight checked regularly.  FINANCIAL OVERSIGHT: Supervision, especially oversight when making financial decisions or transactions is also recommended.  HOME SAFETY: Consider the safety of the kitchen when operating appliances like stoves, microwave oven, and blender. Consider having supervision and share cooking responsibilities until no longer able to participate in those. Accidents with firearms and other hazards in the house should be identified and addressed as well.   ABILITY TO BE LEFT ALONE: If patient is unable to contact 911 operator, consider using LifeLine, or when the need is there, arrange for someone to stay with patients. Smoking is a fire hazard, consider supervision or cessation. Risk of wandering should be assessed by caregiver and if detected at any point, supervision and safe proof recommendations should be instituted.  MEDICATION SUPERVISION: Inability to self-administer medication needs to be constantly addressed. Implement a mechanism to ensure safe administration of the medications.   DRIVING: Regarding driving, in patients with progressive memory problems, driving will be impaired. We advise to have someone else do the driving if trouble finding directions or if minor accidents are reported. Independent driving assessment is available to determine safety of driving.   If you  are interested in the driving assessment, you can contact the following:  [employer] in [place]  [place] [phone] or [phone]    [place] refers to food and lifestyle choices that are based on the traditions of countries located on the [employer]. This way of eating has been shown to help prevent certain conditions and improve outcomes for people who have chronic diseases, like kidney disease and heart disease. What are tips for following this plan? Lifestyle  Cook and eat meals together with your family, when possible. Drink enough fluid to keep your urine clear or pale yellow. Be physically active every day. This includes: Aerobic exercise like running or swimming. Leisure activities like gardening, walking, or housework. Get 7-8 hours of sleep each night. If recommended by your health care provider, drink red wine in moderation. This means 1 glass a day for nonpregnant women and 2 glasses a day for men. A glass of wine equals 5 oz (150 mL). Reading food labels  Check the serving size of packaged foods. For foods such as rice and pasta, the serving size refers to the amount of cooked product, not dry. Check the total fat in packaged foods. Avoid foods that have saturated fat or trans fats. Check the ingredients list for added sugars, such as corn syrup. Shopping  At the grocery store, buy most of your food from the areas near the walls of the store. This includes: Fresh fruits and vegetables (produce). Grains, beans, nuts, and seeds. Some of these may be available in unpackaged forms or large amounts (in bulk). Fresh seafood. Poultry and eggs. Low-fat dairy products. Buy whole ingredients instead of prepackaged foods. Buy fresh fruits and vegetables in-season from local farmers markets. Buy frozen fruits and vegetables in resealable bags. If you do not have access to quality fresh seafood, buy precooked frozen shrimp or canned fish, such as tuna, salmon, or sardines. Buy small amounts of raw or cooked  vegetables, salads, or olives from the deli or salad bar at your store. Stock your pantry so you always have certain foods on hand, such as olive oil, canned tuna, canned tomatoes, rice, pasta, and beans. Cooking  Cook foods with extra-virgin olive oil instead of using butter or other vegetable oils. Have meat as a side dish, and have vegetables or grains as your main dish. This means having meat in small portions or adding small amounts of meat to foods like pasta or stew. Use beans or vegetables instead of meat in common dishes like chili or lasagna. Experiment with different cooking methods. Try roasting or broiling vegetables instead of steaming or sauteing them. Add frozen vegetables to soups, stews, pasta, or rice. Add nuts or seeds for added healthy fat at each meal. You can add these to yogurt, salads, or vegetable dishes. Marinate fish or vegetables using olive oil, lemon juice, garlic, and fresh herbs. Meal planning  Plan to eat 1 vegetarian meal one day each week. Try to work up to 2 vegetarian meals, if possible. Eat seafood 2 or more times a week. Have healthy snacks readily available, such as: Vegetable sticks with hummus. Greek yogurt. Fruit and nut trail mix. Eat balanced meals throughout the week. This includes: Fruit: 2-3 servings a day Vegetables: 4-5 servings a day Low-fat dairy: 2 servings a day Fish, poultry, or lean meat: 1 serving a day Beans and legumes: 2 or more servings a  week Nuts and seeds: 1-2 servings a day Whole grains: 6-8 servings a day Extra-virgin olive oil: 3-4 servings a day Limit red meat and sweets to only a few servings a month What are my food choices? Mediterranean diet Recommended Grains: Whole-grain pasta. Brown rice. Bulgar wheat. Polenta. Couscous. Whole-wheat bread. [place]. Vegetables: Artichokes. Beets. Broccoli. Cabbage. Carrots. Eggplant. Green beans. Chard. Kale. Spinach. Onions. Leeks. Peas. Squash. Tomatoes. Peppers.  Radishes. Fruits: Apples. Apricots. Avocado. Berries. Bananas. Cherries. Dates. Figs. Grapes. Lemons. Melon. Oranges. Peaches. Plums. Pomegranate. Meats and other protein foods: Beans. Almonds. Sunflower seeds. Pine nuts. Peanuts. Champ. Salmon. Scallops. Shrimp. Garfield. Tilapia. Clams. Oysters. Eggs. Dairy: Low-fat milk. Cheese. Greek yogurt. Beverages: Water. Red wine. Herbal tea. Fats and oils: Extra virgin olive oil. Avocado oil. Grape seed oil. Sweets and desserts: [place] yogurt with honey. Baked apples. Poached pears. Trail mix. Seasoning and other foods: Basil. Cilantro. Coriander. Cumin. Mint. Parsley. Sage. Rosemary. Tarragon. Garlic. Oregano. Thyme. Pepper. Balsalmic vinegar. Tahini. Hummus. Tomato sauce. Olives. Mushrooms. Limit these Grains: Prepackaged pasta or rice dishes. Prepackaged cereal with added sugar. Vegetables: Deep fried potatoes (french fries). Fruits: Fruit canned in syrup. Meats and other protein foods: Beef. Pork. Lamb. Poultry with skin. Hot dogs. Bernie[place] Salines. Dairy: Ice cream. Sour cream. Whole milk. Beverages: Juice. Sugar-sweetened soft drinks. Beer. Liquor and spirits. Fats and oils: Butter. Canola oil. Vegetable oil. Beef fat (tallow). Lard. Sweets and desserts: Cookies. Cakes. Pies. Candy. Seasoning and other foods: Mayonnaise. Premade sauces and marinades. The items listed may not be a complete list. Talk with your dietitian about what dietary choices are right for you. Summary The Mediterranean diet includes both food and lifestyle choices. Eat a variety of fresh fruits and vegetables, beans, nuts, seeds, and whole grains. Limit the amount of red meat and sweets that you eat. Talk with your health care provider about whether it is safe for you to drink red wine in moderation. This means 1 glass a day for nonpregnant women and 2 glasses a day for men. A glass of wine equals 5 oz (150 mL). This information is not intended to replace advice given to you by your health  care provider. Make sure you discuss any questions you have with your health care provider. Document Released: 11/19/2015 Document Revised: 12/22/2015 Document Reviewed: 11/19/2015 Elsevier Interactive Patient Education  2017 [employer].

## 2020-12-03 ENCOUNTER — Other Ambulatory Visit: Payer: Self-pay | Admitting: Pulmonary Disease

## 2020-12-03 ENCOUNTER — Encounter: Payer: Self-pay | Admitting: Pulmonary Disease

## 2020-12-03 ENCOUNTER — Ambulatory Visit: Payer: Medicare Other | Admitting: Pulmonary Disease

## 2020-12-03 ENCOUNTER — Other Ambulatory Visit: Payer: Self-pay

## 2020-12-03 VITALS — BP 126/74 | HR 73 | Temp 98.8°F | Ht 68.0 in | Wt 192.0 lb

## 2020-12-03 DIAGNOSIS — R0683 Snoring: Secondary | ICD-10-CM

## 2020-12-03 DIAGNOSIS — R413 Other amnesia: Secondary | ICD-10-CM

## 2020-12-03 NOTE — Patient Instructions (Signed)
Will arrange for home sleep study Will call to arrange for follow up after sleep study reviewed  

## 2020-12-03 NOTE — Progress Notes (Signed)
[facility]  Chief Complaint  Patient presents with   Sleep Consult    Referred by Dr Howard Pouch. Pt has never had sleep study. She states she has woken herself up snoring before, and that she occ has memory loss. She usually wakes up once in the night to use the bathroom.  Epworth score = 9.    Constitutional:  BP 126/74 (BP Location: Left Arm, Cuff Size: Normal)   Pulse 73   Temp 98.8 F (37.1 C) (Oral)   Ht '5\' 8"'$  (1.727 m)   Wt 192 lb (87.1 kg)   SpO2 98% Comment: on RA  BMI 29.19 kg/m   Past Medical History:  Anemia, Anxiety, OA, Colon polyps, PUD, GERD, Hiatal hernia, HTN, Hypothyroidism, Migraine headache, Osteoporosis, Pneumonia, Meningioma, COVID 19 infection December 2020  Past Surgical History:  She  has a past surgical history that includes Abdominal hysterectomy; Bilateral oophorectomy; Thoracic spine surgery (2008); Esophagogastroduodenoscopy (11/05/2010); Colonoscopy (01/19/2011); Esophagogastroduodenoscopy (N/A, 07/25/2012); Cataract extraction w/PHACO (Left, 10/20/2014); Cataract extraction w/PHACO (Right, 11/17/2014); thoracic tumor; Esophagogastroduodenoscopy (N/A, 05/27/2015); Dilation and curettage of uterus; Hiatal hernia repair (N/A, 08/25/2015); Laparoscopic Nissen fundoplication (N/A, [id number]); and Anterior cervical decomp/discectomy fusion.  Brief Summary:  Debra Sanchez is a 80 y.o. female with snoring and memory difficulties.      Subjective:   She is being assessed for memory difficulties.  It was found that she has disrupted sleep and snoring.  Her husband says she will stop breathing at night and she has woken up feeling her throat closing.  She has trouble staying awake at church.  She goes to sleep at 930 pm.  She falls asleep after 30 minutes to an hour.  She wakes up 1 or 2 times to use the bathroom.  She gets out of bed at 830 am.  She feels tired in the morning.  She denies morning headache.  She  does not use anything to help her stay awake.  She takes melatonin at 9 pm.  She denies sleep walking, sleep talking, bruxism, or nightmares.  There is no history of restless legs.  She denies sleep hallucinations, sleep paralysis, or cataplexy.  The Epworth score is 9 out of 24.   Physical Exam:   Appearance - well kempt   ENMT - no sinus tenderness, no oral exudate, no LAN, Mallampati 4 airway, no stridor, over bite, narrow jaw  Respiratory - equal breath sounds bilaterally, no wheezing or rales  CV - s1s2 regular rate and rhythm, no murmurs  Ext - no clubbing, no edema  Skin - no rashes  Psych - normal mood and affect   Sleep Tests:    Cardiac Tests:  Echo 05/28/14 >> EF 60%  Social History:  She  reports that she has never smoked. She has never used smokeless tobacco. She reports that she does not drink alcohol and does not use drugs.  Family History:  Her family history includes Arthritis in her brother, father, maternal aunt, maternal uncle, mother, and sister; Colon cancer in her mother; Diabetes in her maternal uncle; Heart disease in her father and mother; Lung cancer in her brother.    Discussion:  She has snoring, sleep disruption, apnea, and daytime sleepiness.  She has anxiety and hypertension, and has trouble with memory.  I am concerned she could have obstructive sleep apnea.  Assessment/Plan:   Snoring with excessive daytime sleepiness. - will need to arrange for a home sleep study  Obesity. -  discussed how weight can impact sleep and risk for sleep disordered breathing - discussed options to assist with weight loss: combination of diet modification, cardiovascular and strength training exercises  Cardiovascular risk. - had an extensive discussion regarding the adverse health consequences related to untreated sleep disordered breathing - specifically discussed the risks for hypertension, coronary artery disease, cardiac dysrhythmias, cerebrovascular  disease, and diabetes - lifestyle modification discussed  Safe driving practices. - discussed how sleep disruption can increase risk of accidents, particularly when driving - safe driving practices were discussed  Therapies for obstructive sleep apnea. - if the sleep study shows significant sleep apnea, then various therapies for treatment were reviewed: CPAP, oral appliance, and surgical interventions  Time Spent Involved in Patient Care on Day of Examination:  31 minutes  Follow up:   Patient Instructions  Will arrange for home sleep study Will call to arrange for follow up after sleep study reviewed  Medication List:   Allergies as of 12/03/2020       Reactions   Hydromorphone Itching, Nausea Only, Other (See Comments)   After IV dilaudid pt flushed and felt dizzy   Ciprofloxacin Other (See Comments)   Stomach problems   Codeine Itching   Latex Other (See Comments)   fainted   Morphine And Related Itching   Nsaids Nausea And Vomiting   Penicillins Itching, Nausea And Vomiting   Has patient had a PCN reaction causing immediate rash, facial/tongue/throat swelling, SOB or lightheadedness with hypotension: No Has patient had a PCN reaction causing severe rash involving mucus membranes or skin necrosis: No Has patient had a PCN reaction that required hospitalization No Has patient had a PCN reaction occurring within the last 10 years: Yes If all of the above answers are "NO", then may proceed with Cephalosporin use.   Sulfa Antibiotics Nausea And Vomiting        Medication List        Accurate as of December 03, 2020 12:18 PM. If you have any questions, ask your nurse or doctor.          STOP taking these medications    ipratropium 0.06 % nasal spray Commonly known as: ATROVENT Stopped by: Chesley Mires, MD   predniSONE 5 MG tablet Commonly known as: DELTASONE Stopped by: Chesley Mires, MD       TAKE these medications    acetaminophen 325 MG  tablet Commonly known as: TYLENOL Take 325 mg by mouth every 6 (six) hours as needed for mild pain (For pain.).   albuterol 108 (90 Base) MCG/ACT inhaler Commonly known as: VENTOLIN HFA SMARTSIG:2 Puff(s) By Mouth Every 6 Hours PRN   amLODipine 2.5 MG tablet Commonly known as: NORVASC Take 1 tablet (2.5 mg total) by mouth 2 (two) times daily.   atorvastatin 20 MG tablet Commonly known as: LIPITOR Take 1 tablet (20 mg total) by mouth daily.   diclofenac 75 MG EC tablet Commonly known as: VOLTAREN Take 1 tablet (75 mg total) by mouth 2 (two) times daily.   esomeprazole 40 MG capsule Commonly known as: NexIUM Take 1 capsule (40 mg total) by mouth daily before breakfast.   famotidine 40 MG tablet Commonly known as: PEPCID TAKE 1 TABLET BY MOUTH AT  BEDTIME   levothyroxine 50 MCG tablet Commonly known as: SYNTHROID Take 1 tablet (50 mcg total) by mouth daily.   losartan 100 MG tablet Commonly known as: COZAAR Take 1 tablet (100 mg total) by mouth daily.   meclizine 25 MG tablet Commonly  known as: ANTIVERT Take 25 mg by mouth 3 (three) times daily as needed.   pyridOXINE 100 MG tablet Commonly known as: VITAMIN B-6 Take 100 mg by mouth daily.   sertraline 100 MG tablet Commonly known as: ZOLOFT Take 1 tablet (100 mg total) by mouth at bedtime.   vitamin B-12 1000 MCG tablet Commonly known as: CYANOCOBALAMIN Take 1,000 mcg by mouth daily.   VITAMIN B 12 PO Take 1 tablet by mouth daily.   VITAMIN D-VITAMIN K PO Take 5,000 Units by mouth every other day.        Signature:  Chesley Mires, MD [place] Pager - [phone] 12/03/2020, 12:18 PM

## 2020-12-04 DIAGNOSIS — J0100 Acute maxillary sinusitis, unspecified: Secondary | ICD-10-CM | POA: Diagnosis not present

## 2020-12-07 ENCOUNTER — Institutional Professional Consult (permissible substitution): Payer: Medicare Other | Admitting: Neurology

## 2021-01-02 DIAGNOSIS — J0100 Acute maxillary sinusitis, unspecified: Secondary | ICD-10-CM | POA: Diagnosis not present

## 2021-01-05 ENCOUNTER — Ambulatory Visit: Payer: Medicare Other | Admitting: Cardiology

## 2021-01-21 ENCOUNTER — Other Ambulatory Visit: Payer: Self-pay

## 2021-01-21 ENCOUNTER — Telehealth: Payer: Self-pay | Admitting: Family Medicine

## 2021-01-21 ENCOUNTER — Encounter (HOSPITAL_COMMUNITY): Payer: Self-pay | Admitting: *Deleted

## 2021-01-21 ENCOUNTER — Emergency Department (HOSPITAL_COMMUNITY)
Admission: EM | Admit: 2021-01-21 | Discharge: 2021-01-21 | Disposition: A | Discharge status: Home / Self Care | Payer: Medicare Other | Attending: Emergency Medicine | Admitting: Emergency Medicine

## 2021-01-21 DIAGNOSIS — E039 Hypothyroidism, unspecified: Secondary | ICD-10-CM | POA: Insufficient documentation

## 2021-01-21 DIAGNOSIS — R42 Dizziness and giddiness: Secondary | ICD-10-CM | POA: Diagnosis not present

## 2021-01-21 DIAGNOSIS — I10 Essential (primary) hypertension: Secondary | ICD-10-CM | POA: Diagnosis not present

## 2021-01-21 DIAGNOSIS — Z8616 Personal history of COVID-19: Secondary | ICD-10-CM | POA: Diagnosis not present

## 2021-01-21 DIAGNOSIS — Z91040 Latex allergy status: Secondary | ICD-10-CM | POA: Insufficient documentation

## 2021-01-21 DIAGNOSIS — R4182 Altered mental status, unspecified: Secondary | ICD-10-CM | POA: Diagnosis not present

## 2021-01-21 DIAGNOSIS — M542 Cervicalgia: Secondary | ICD-10-CM | POA: Insufficient documentation

## 2021-01-21 DIAGNOSIS — R410 Disorientation, unspecified: Secondary | ICD-10-CM | POA: Diagnosis not present

## 2021-01-21 DIAGNOSIS — Z79899 Other long term (current) drug therapy: Secondary | ICD-10-CM | POA: Insufficient documentation

## 2021-01-21 DIAGNOSIS — R29898 Other symptoms and signs involving the musculoskeletal system: Secondary | ICD-10-CM

## 2021-01-21 LAB — BASIC METABOLIC PANEL
Anion gap: 5 (ref 5–15)
BUN: 15 mg/dL (ref 8–23)
CO2: 27 mmol/L (ref 22–32)
Calcium: 8.7 mg/dL — ABNORMAL LOW (ref 8.9–10.3)
Chloride: 105 mmol/L (ref 98–111)
Creatinine, Ser: 0.85 mg/dL (ref 0.44–1.00)
GFR, Estimated: >60 mL/min (ref >60)
Glucose, Bld: 95 mg/dL (ref 70–99)
Potassium: 3.9 mmol/L (ref 3.5–5.1)
Sodium: 137 mmol/L (ref 135–145)

## 2021-01-21 LAB — CBC WITH DIFFERENTIAL/PLATELET
Abs Immature Granulocytes: 0.02 10*3/uL (ref 0.00–0.07)
Basophils Absolute: 0 10*3/uL (ref 0.0–0.1)
Basophils Relative: 1 %
Eosinophils Absolute: 0 10*3/uL (ref 0.0–0.5)
Eosinophils Relative: 1 %
HCT: 38.4 % (ref 36.0–46.0)
Hemoglobin: 12.7 g/dL (ref 12.0–15.0)
Immature Granulocytes: 0 %
Lymphocytes Relative: 26 %
Lymphs Abs: 1.4 10*3/uL (ref 0.7–4.0)
MCH: 31.8 pg (ref 26.0–34.0)
MCHC: 33.1 g/dL (ref 30.0–36.0)
MCV: 96.2 fL (ref 80.0–100.0)
Monocytes Absolute: 0.4 10*3/uL (ref 0.1–1.0)
Monocytes Relative: 6 %
Neutro Abs: 3.7 10*3/uL (ref 1.7–7.7)
Neutrophils Relative %: 66 %
Platelets: 174 10*3/uL (ref 150–400)
RBC: 3.99 MIL/uL (ref 3.87–5.11)
RDW: 13.5 % (ref 11.5–15.5)
WBC: 5.5 10*3/uL (ref 4.0–10.5)
nRBC: 0 % (ref 0.0–0.2)

## 2021-01-21 MED ORDER — CYCLOBENZAPRINE HCL 5 MG PO TABS: 10.0000 mg | ORAL_TABLET | Freq: Two times a day (BID) | ORAL | 0 refills | Status: AC | PRN

## 2021-01-21 NOTE — ED Provider Notes (Signed)
[facility] EMERGENCY DEPARTMENT Provider Note   CSN: [id number] Arrival date & time: 01/21/21  1004     History Chief Complaint  Patient presents with   Altered Mental Status    Debra Sanchez is a 80 y.o. female.  HPI  Patient with significant medical history of anxiety, DDD of the lumbar spine, GERD, hypertension presents to the emergency department with chief complaint of brief episodes of confusion.  Patient states has been going on since June of this year, and has gotten worse, she states that she will go to places and occasionally forget where she supposed to be or what is going on.  She also notes that she is having some right neck/back head tightness, states that this is been constant since June, she has no actual headaches, no change in vision, paresthesias or weakness upper lower extremities, she states the pain has not gotten any worse but actually has improved since June but just remains constant.  She denies  recent head trauma, is not on anticoagulant, she denies associated fevers, chills, chest pain, worsening pedal edema, no history of IV drug use.  She does note that she is having some slight dizziness, this dizziness is positional, and will last only a couple seconds and resolve on its own.  She currently has no dizziness at this time.  She recently had a MRI of her brain a month ago which was unremarkable other than a lipoma.  Patient not endorse fevers, chills, chest pain, shortness of breath, Donnell pain, worsening pedal edema. Past Medical History:  Diagnosis Date   Anemia    Anxiety    Arthritis    Blood transfusion 2012   Colon polyp    COVID-19 03/2019   DDD (degenerative disc disease), thoracolumbar    Degenerative disc disease, lumbar    Family history of adverse reaction to anesthesia    pts sister got very sick / burnt esophagus    Gastric ulcer    GERD (gastroesophageal reflux disease)    Heart murmur    History of hiatal hernia    Hypertension     Hypothyroidism    Migraines    Miscarriage    Osteoporosis    Pneumonia yrs ago   Syncope 2012   Wears glasses     Patient Active Problem List   Diagnosis Date Noted   Esophageal dysphagia 10/13/2020   Continuous leakage of urine 03/18/2020   Mixed hyperlipidemia 03/19/2019   GERD (gastroesophageal reflux disease) 12/18/2018   Anxiety 10/22/2018   Overweight (BMI 25.0-29.9) 10/22/2018   Chronic pain of both knees 01/29/2018   Persistent headaches 11/18/2016   Hiatal hernia with GERD 06/17/2016   Status post laparoscopic Nissen fundoplication 95/28/4132   Pulmonary nodules 05/12/2015   Insomnia 04/20/2015   Hypertension 05/15/2012   Hypothyroidism 05/15/2012   History of colonic polyps 05/15/2012   Anemia 05/15/2012   Heme positive stool 05/15/2012    Past Surgical History:  Procedure Laterality Date   ABDOMINAL HYSTERECTOMY     ANTERIOR CERVICAL DECOMP/DISCECTOMY FUSION     C5-6 and C6-7 anterior cervical diskectomy and fusion    BILATERAL OOPHORECTOMY     CATARACT EXTRACTION W/PHACO Left 10/20/2014   Procedure: CATARACT EXTRACTION PHACO AND INTRAOCULAR LENS PLACEMENT LEFT EYE;  Surgeon: Tonny Branch, MD;  Location: [facility];  Service: Ophthalmology;  Laterality: Left;  CDE:6.98   CATARACT EXTRACTION W/PHACO Right 11/17/2014   Procedure: CATARACT EXTRACTION PHACO AND INTRAOCULAR LENS PLACEMENT RIGHT EYE CDE=6.57;  Surgeon: Tonny Branch,  MD;  Location: [facility];  Service: Ophthalmology;  Laterality: Right;   COLONOSCOPY  01/19/2011   Procedure: COLONOSCOPY;  Surgeon: Rogene Houston, MD;  Location: [facility];  Service: Endoscopy;  Laterality: N/A;  2:00   DILATION AND CURETTAGE OF UTERUS     ESOPHAGOGASTRODUODENOSCOPY  11/05/2010   Procedure: ESOPHAGOGASTRODUODENOSCOPY (EGD);  Surgeon: Rogene Houston, MD;  Location: [facility];  Service: Endoscopy;  Laterality: N/A;  7:00 am per Melanie   ESOPHAGOGASTRODUODENOSCOPY N/A 07/25/2012   Procedure: ESOPHAGOGASTRODUODENOSCOPY  (EGD);  Surgeon: Rogene Houston, MD;  Location: [facility];  Service: Endoscopy;  Laterality: N/A;  400-moved to 27 Ann notified pt   ESOPHAGOGASTRODUODENOSCOPY N/A 05/27/2015   Procedure: ESOPHAGOGASTRODUODENOSCOPY (EGD);  Surgeon: Rogene Houston, MD;  Location: [facility];  Service: Endoscopy;  Laterality: N/A;  2:00   HIATAL HERNIA REPAIR N/A 08/25/2015   Procedure: LAPAROSCOPIC REPAIR OF LARGE TYPE III  HIATAL HERNIA;  Surgeon: Johnathan Hausen, MD;  Location: [facility];  Service: General;  Laterality: N/A;   LAPAROSCOPIC NISSEN FUNDOPLICATION N/A 10/14/2829   Procedure: ENDOSCOPY, HIATAL HERNIA REPAIR, AND TAKE DOWN OF NISSEN FUNDOPILICATION;  Surgeon: Johnathan Hausen, MD;  Location: [facility];  Service: General;  Laterality: N/A;   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       OB History     Gravida  3   Para  2   Term  2   Preterm      AB  1   Living         SAB  1   IAB      Ectopic      Multiple      Live Births              Family History  Problem Relation Age of Onset   Colon cancer Mother    Heart disease Mother    Arthritis Mother    Heart disease Father    Arthritis Father    Arthritis Sister    Arthritis Brother    Lung cancer Brother        smoker   Arthritis Maternal Aunt    Arthritis Maternal Uncle    Diabetes Maternal Uncle     Social History   Tobacco Use   Smoking status: Never   Smokeless tobacco: Never  Vaping Use   Vaping Use: Never used  Substance Use Topics   Alcohol use: No    Alcohol/week: 0.0 standard drinks   Drug use: No    Home Medications Prior to Admission medications   Medication Sig Start Date End Date Taking? Authorizing Provider  cyclobenzaprine (FLEXERIL) 5 MG tablet Take 2 tablets (10 mg total) by mouth 2 (two) times daily as needed for up to 10 days for muscle spasms. 01/21/21 01/31/21 Yes Marcello Fennel, PA-C  acetaminophen (TYLENOL) 325 MG  tablet Take 325 mg by mouth every 6 (six) hours as needed for mild pain (For pain.).     [provider]  albuterol (VENTOLIN HFA) 108 (90 Base) MCG/ACT inhaler SMARTSIG:2 Puff(s) By Mouth Every 6 Hours PRN 11/09/20   [provider]  amLODipine (NORVASC) 2.5 MG tablet Take 1 tablet (2.5 mg total) by mouth 2 (two) times daily. 10/02/20   Kuneff, Renee A, DO  atorvastatin (LIPITOR) 20 MG tablet Take 1 tablet (20 mg total) by mouth daily. 10/02/20   Kuneff, Renee A, DO  Cyanocobalamin (VITAMIN B 12 PO) Take 1 tablet by mouth daily.    [provider]  diclofenac (VOLTAREN) 75 MG EC tablet Take 1 tablet (75 mg total) by mouth 2 (two) times daily. 11/11/20   Kuneff, Renee A, DO  esomeprazole (NEXIUM) 40 MG capsule Take 1 capsule (40 mg total) by mouth daily before breakfast. 10/13/20   Rogene Houston, MD  famotidine (PEPCID) 40 MG tablet TAKE 1 TABLET BY MOUTH AT  BEDTIME 04/16/20   Harvel Quale, MD  levothyroxine (SYNTHROID) 50 MCG tablet Take 1 tablet (50 mcg total) by mouth daily. 03/19/20   Kuneff, Renee A, DO  losartan (COZAAR) 100 MG tablet Take 1 tablet (100 mg total) by mouth daily. 10/02/20   Kuneff, Renee A, DO  meclizine (ANTIVERT) 25 MG tablet Take 25 mg by mouth 3 (three) times daily as needed. 11/09/20   [provider]  pyridOXINE (VITAMIN B-6) 100 MG tablet Take 100 mg by mouth daily.    [provider]  sertraline (ZOLOFT) 100 MG tablet Take 1 tablet (100 mg total) by mouth at bedtime. 10/02/20   Kuneff, Renee A, DO  vitamin B-12 (CYANOCOBALAMIN) 1000 MCG tablet Take 1,000 mcg by mouth daily.    [provider]  VITAMIN D-VITAMIN K PO Take 5,000 Units by mouth every other day.    [provider]    Allergies    Hydromorphone, Ciprofloxacin, Codeine, Latex, Morphine and related, Nsaids, Penicillins, and Sulfa antibiotics  Review of Systems   Review of Systems  Constitutional:  Negative for chills and fever.  HENT:   Negative for congestion.   Respiratory:  Negative for shortness of breath.   Cardiovascular:  Negative for chest pain.  Gastrointestinal:  Negative for abdominal pain.  Genitourinary:  Negative for enuresis.  Musculoskeletal:  Positive for neck stiffness. Negative for back pain and neck pain.  Skin:  Negative for rash.  Neurological:  Negative for dizziness.       Confusion  Hematological:  Does not bruise/bleed easily.   Physical Exam Updated Vital Signs BP (!) 178/107   Pulse 79   Temp 98 F (36.7 C) (Oral)   Resp 20   SpO2 99%   Physical Exam Vitals and nursing note reviewed.  Constitutional:      General: She is not in acute distress.    Appearance: She is not ill-appearing.  HENT:     Head: Normocephalic and atraumatic.     Nose: No congestion.     Mouth/Throat:     Mouth: Mucous membranes are moist.     Pharynx: Oropharynx is clear.  Eyes:     Extraocular Movements: Extraocular movements intact.     Conjunctiva/sclera: Conjunctivae normal.     Pupils: Pupils are equal, round, and reactive to light.  Neck:     Comments: Patient is noted right-sided neck tenderness within the musculature, no torticollis noted. Cardiovascular:     Rate and Rhythm: Normal rate and regular rhythm.     Pulses: Normal pulses.     Heart sounds: No murmur heard.   No friction rub. No gallop.  Pulmonary:     Effort: No respiratory distress.     Breath sounds: No wheezing, rhonchi or rales.  Musculoskeletal:     Cervical back: Tenderness present. No rigidity.     Right lower leg: No edema.     Left lower leg: No edema.     Comments: Able to move all 4 extremities, 5 of 5 strength,  neurovascular fully intact.  Gait fully intact.  Skin:    General: Skin is warm and dry.  Neurological:     Mental Status: She is alert.     GCS: GCS eye subscore is 4. GCS verbal subscore is 5. GCS motor subscore is 6.     Cranial Nerves: No cranial nerve deficit.     Sensory: Sensation is intact.      Motor: No weakness.     Coordination: Romberg sign negative. Finger-Nose-Finger Test normal.     Gait: Gait normal.     Comments: Cranial nerves II through XII grossly intact, no difficult word finding, no slurring of her words, able to follow two-step commands, no unilateral weakness present.  Patient is A&O x4  Psychiatric:        Mood and Affect: Mood normal.    ED Results / Procedures / Treatments   Labs (all labs ordered are listed, but only abnormal results are displayed) Labs Reviewed  BASIC METABOLIC PANEL - Abnormal; Notable for the following components:      Result Value   Calcium 8.7 (*)    All other components within normal limits  CBC WITH DIFFERENTIAL/PLATELET  URINALYSIS, ROUTINE W REFLEX MICROSCOPIC    EKG None  Radiology No results found.  Procedures Procedures   Medications Ordered in ED Medications - No data to display  ED Course  I have reviewed the triage vital signs and the nursing notes.  Pertinent labs & imaging results that were available during my care of the patient were reviewed by me and considered in my medical decision making (see chart for details).    MDM Rules/Calculators/A&P                          Initial impression-patient presents with brief episodes of confusion.  She is alert, does not appear in acute stress, vital signs are for BP of 178/107.  Recommended repeat head CT but patient was not agreeable to this, she is in agreement with basic lab work-up and a UA we will continue to monitor.  Work-up-CBC unremarkable, BMP unremarkable  Reassessment-notified by nursing that patient would like to go home.  She was unable to provide [place] with a urine specimen.  I went to evaluate the patient's patient states she would like to go home, I said that is fine by like her to follow-up with nephrology for further evaluation, I recommend that we did do a head CT but but she would rather follow-up with neurology for further evaluation.  Rule  out-I have low suspicion for intracranial head bleed and or mass as patient denies recent falls, she is not on anticoagulant, and denies any headaches, change in vision, paresthesia or weakness of upper/ lower extremities.  She did have a MRI of brain 2 months ago which was essentially unremarkable shows a small meningioma.  I recommended that we repeat the CT head but patient deferred this.  Low Suspicion for CVA as she has no focal deficits present my exam presentation atypical of etiology.  Low suspicion for dissection or aneurysm of the vertebral/carotid arteries as presentation is atypical,  having this consistent right-sided neck/head tightness for the last 4 months that has actually improved, not the worst headache of her life, did not come on spontaneously.  Low suspicion for meningitis as she has no meningeal sign.  I have low suspicion for metabolic abnormality as her lab work is unremarkable, unable to assess UA as  she was unable to provide any urine and does not want to stay any longer.  Plan-  Memory loss, right-sided neck tightness-unclear etiology, likely multifactorial, muscular strain within the neck causing her to have right-sided tenderness of her head.  memory loss and confusion may secondary due to cognitive decline will recommend that she follows up with neurology for further evaluation.  Gave her strict return precautions.  Vital signs have remained stable, no indication for hospital admission.  Patient discussed with attending and they agreed with assessment and plan.  Patient given at home care as well strict return precautions.  Patient verbalized that they understood agreed to said plan.  Final Clinical Impression(s) / ED Diagnoses Final diagnoses:  Confusion  Neck tightness    Rx / DC Orders ED Discharge Orders          Ordered    cyclobenzaprine (FLEXERIL) 5 MG tablet  2 times daily PRN        01/21/21 1457             Marcello Fennel, PA-C 01/21/21  1458    Isla Pence, MD 01/26/21 1506

## 2021-01-21 NOTE — Discharge Instructions (Addendum)
Lab work was unremarkable.  I have given you a muscle relaxer for your neck stiffness please take this as prescribed.  Also recommend over-the-counter pain medications this may help with your neck tenderness.  Please follow-up with neurology for further evaluation.  Come back to the emergency department if you develop chest pain, shortness of breath, severe abdominal pain, uncontrolled nausea, vomiting, diarrhea.

## 2021-01-21 NOTE — Telephone Encounter (Signed)
FYI

## 2021-01-21 NOTE — ED Triage Notes (Signed)
C/o confusion, dizziness and pressure right side of head for several months, states she forgets her destinations when driving. States she continues to get worse.

## 2021-01-21 NOTE — Telephone Encounter (Signed)
Please see other encounter.

## 2021-01-21 NOTE — Telephone Encounter (Signed)
Pt said she was sent to get a brain scan a while back and said that it was found out that she has a small tumor and said that she has been getting worse with her memory and feeling dizzy. She said she wanted to let you know that she is going to the ER today at [facility]

## 2021-01-21 NOTE — Telephone Encounter (Signed)
Noted  

## 2021-01-21 NOTE — Telephone Encounter (Signed)
Debra Sanchez (9:34 AM)    Pt said she was sent to get a brain scan a while back and said that it was found out that she has a small tumor and said that she has been getting worse with her memory and feeling dizzy. She said she wanted to let you know that she is going to the ER today at [facility]

## 2021-01-22 ENCOUNTER — Other Ambulatory Visit: Payer: Self-pay | Admitting: Family Medicine

## 2021-02-09 ENCOUNTER — Other Ambulatory Visit: Payer: Self-pay | Admitting: Family Medicine

## 2021-02-09 DIAGNOSIS — E785 Hyperlipidemia, unspecified: Secondary | ICD-10-CM | POA: Diagnosis not present

## 2021-02-09 DIAGNOSIS — I10 Essential (primary) hypertension: Secondary | ICD-10-CM | POA: Diagnosis not present

## 2021-02-09 DIAGNOSIS — M62838 Other muscle spasm: Secondary | ICD-10-CM | POA: Diagnosis not present

## 2021-02-09 DIAGNOSIS — G500 Trigeminal neuralgia: Secondary | ICD-10-CM | POA: Diagnosis not present

## 2021-02-09 DIAGNOSIS — Z8249 Family history of ischemic heart disease and other diseases of the circulatory system: Secondary | ICD-10-CM | POA: Diagnosis not present

## 2021-02-09 DIAGNOSIS — Z800 Family history of malignant neoplasm of digestive organs: Secondary | ICD-10-CM | POA: Diagnosis not present

## 2021-02-09 DIAGNOSIS — R42 Dizziness and giddiness: Secondary | ICD-10-CM | POA: Diagnosis not present

## 2021-02-09 DIAGNOSIS — M62830 Muscle spasm of back: Secondary | ICD-10-CM | POA: Diagnosis not present

## 2021-02-09 DIAGNOSIS — E039 Hypothyroidism, unspecified: Secondary | ICD-10-CM | POA: Diagnosis not present

## 2021-02-09 DIAGNOSIS — D329 Benign neoplasm of meninges, unspecified: Secondary | ICD-10-CM | POA: Diagnosis not present

## 2021-02-09 DIAGNOSIS — R519 Headache, unspecified: Secondary | ICD-10-CM | POA: Diagnosis not present

## 2021-02-15 DIAGNOSIS — J0100 Acute maxillary sinusitis, unspecified: Secondary | ICD-10-CM | POA: Diagnosis not present

## 2021-02-15 DIAGNOSIS — R309 Painful micturition, unspecified: Secondary | ICD-10-CM | POA: Diagnosis not present

## 2021-02-15 DIAGNOSIS — N3000 Acute cystitis without hematuria: Secondary | ICD-10-CM | POA: Diagnosis not present

## 2021-03-08 ENCOUNTER — Ambulatory Visit: Payer: Medicare Other | Admitting: Dermatology

## 2021-03-09 ENCOUNTER — Ambulatory Visit: Payer: Medicare Other | Admitting: Dermatology

## 2021-03-12 DIAGNOSIS — J019 Acute sinusitis, unspecified: Secondary | ICD-10-CM | POA: Diagnosis not present

## 2021-03-12 DIAGNOSIS — R519 Headache, unspecified: Secondary | ICD-10-CM | POA: Diagnosis not present

## 2021-03-12 DIAGNOSIS — E785 Hyperlipidemia, unspecified: Secondary | ICD-10-CM | POA: Diagnosis not present

## 2021-03-12 DIAGNOSIS — I10 Essential (primary) hypertension: Secondary | ICD-10-CM | POA: Diagnosis not present

## 2021-03-17 ENCOUNTER — Telehealth: Payer: Self-pay

## 2021-03-17 NOTE — Telephone Encounter (Addendum)
Pt called stating that she and her husband tested positive for COVID today. States that her symptoms started yesterday and as of right now she only has a sore throat. Pt is requesting to have Rx sent to the pharmacy.  Please advise, thanks.

## 2021-03-17 NOTE — Telephone Encounter (Signed)
Spoke with pt and informed her of needing an appt for medication.  Pt will await call from office tomorrow 12/8.  Note: Per RK pt can be dbl bk at 1145 VV for pos COVID.

## 2021-03-18 ENCOUNTER — Telehealth (INDEPENDENT_AMBULATORY_CARE_PROVIDER_SITE_OTHER): Payer: Medicare Other | Admitting: Family Medicine

## 2021-03-18 ENCOUNTER — Other Ambulatory Visit: Payer: Self-pay

## 2021-03-18 ENCOUNTER — Encounter: Payer: Self-pay | Admitting: Family Medicine

## 2021-03-18 VITALS — Wt 185.0 lb

## 2021-03-18 DIAGNOSIS — U071 COVID-19: Secondary | ICD-10-CM | POA: Diagnosis not present

## 2021-03-18 DIAGNOSIS — D329 Benign neoplasm of meninges, unspecified: Secondary | ICD-10-CM | POA: Insufficient documentation

## 2021-03-18 MED ORDER — NIRMATRELVIR/RITONAVIR (PAXLOVID)TABLET: 3.0000 | ORAL_TABLET | Freq: Two times a day (BID) | ORAL | 0 refills | Status: AC

## 2021-03-18 MED ORDER — CEFDINIR 300 MG PO CAPS: 300.0000 mg | ORAL_CAPSULE | Freq: Two times a day (BID) | ORAL | 0 refills | Status: DC

## 2021-03-18 NOTE — Patient Instructions (Signed)
10 Things You Can Do to Manage Your COVID-19 Symptoms at Home ?If you have possible or confirmed COVID-19 ?Stay home except to get medical care. ?Monitor your symptoms carefully. If your symptoms get worse, call your healthcare provider immediately. ?Get rest and stay hydrated. ?If you have a medical appointment, call the healthcare provider ahead of time and tell them that you have or may have COVID-19. ?For medical emergencies, call 911 and notify the dispatch personnel that you have or may have COVID-19. ?Cover your cough and sneezes with a tissue or use the inside of your elbow. ?Wash your hands often with soap and water for at least 20 seconds or clean your hands with an alcohol-based hand sanitizer that contains at least 60% alcohol. ?As much as possible, stay in a specific room and away from other people in your home. Also, you should use a separate bathroom, if available. If you need to be around other people in or outside of the home, wear a mask. ?Avoid sharing personal items with other people in your household, like dishes, towels, and bedding. ?Clean all surfaces that are touched often, like counters, tabletops, and doorknobs. Use household cleaning sprays or wipes according to the label instructions. ?cdc.gov/coronavirus ?10/25/2019 ?This information is not intended to replace advice given to you by your health care provider. Make sure you discuss any questions you have with your health care provider. ?Document Revised: 12/18/2020 Document Reviewed: 12/18/2020 ?Elsevier Patient Education ? 2022 [employer] ? ?

## 2021-03-18 NOTE — Progress Notes (Signed)
VIRTUAL VISIT VIA VIDEO  I connected with Debra Sanchez on 03/18/21 at 11:30 AM EST by elemedicine application and verified that I am speaking with the correct person using two identifiers. Location patient: Home Location provider: [facility], Office Persons participating in the virtual visit: Patient, Dr. Raoul Pitch and Darnell Level. Cesar, CMA  I discussed the limitations of evaluation and management by telemedicine and the availability of in person appointments. The patient expressed understanding and agreed to proceed.   SUBJECTIVE Chief Complaint  Patient presents with   Sore Throat    Pt c/o sore throat, facial pain, chest tightness, fatigue x 2 days; tested pos at home 12/7    HPI: Debra Sanchez is a 80 y.o. female present for acute illness.  Exposure:3 days Sx start: 2 days Vaccine:see below Pt endorses  Pt denies initial symptom of sore throat, headache/sinus pressure feels like she is getting a chest cold.  She denies cough or shortness of breath.  She denies nausea, vomiting, diarrhea, fevers or chills.  Her granddaughter was ill when she picked her up from school and later found to have [place].  Patient took a COVID test and it is positive today.  This is the second time she has had COVID.  She did have COVID last year and received an infusion. Otc: Mucinex and Tylenol GFR: Greater than 60 Anticoag: No  Immunization History  Administered Date(s) Administered   Fluad Quad(high Dose 65+) 03/18/2020   Influenza Split 03/08/2020   Influenza, High Dose Seasonal PF 01/16/2017, 01/29/2018, 02/08/2019   Influenza,inj,Quad PF,6+ Mos 01/29/2018   Influenza-Unspecified 02/10/2015, 03/18/2020, 01/09/2021   Pneumococcal Conjugate-13 12/23/2013, 03/18/2019   Pneumococcal Polysaccharide-23 01/29/2018   Tdap 04/12/2006, 11/26/2016   Zoster Recombinat (Shingrix) 02/08/2019   BMP Latest Ref Rng & Units 01/21/2021 10/02/2020 10/02/2020  Glucose 70 - 99 mg/dL 95 90 -  BUN 8 -  23 mg/dL 15 10 -  Creatinine 0.44 - 1.00 mg/dL 0.85 0.87 -  BUN/Creat Ratio 6 - 22 (calc) - NOT APPLICABLE -  Sodium 759 - 145 mmol/L 137 138 -  Potassium 3.5 - 5.1 mmol/L 3.9 4.1 -  Chloride 98 - 111 mmol/L 105 104 -  CO2 22 - 32 mmol/L 27 26 -  Calcium 8.9 - 10.3 mg/dL 8.7(L) 9.3 9.3     ROS: See pertinent positives and negatives per HPI.  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[place]), 8 mm right temporal 03/18/2021   Esophageal dysphagia 10/13/2020   Continuous leakage of urine 03/18/2020   Mixed hyperlipidemia 03/19/2019   GERD (gastroesophageal reflux disease) 12/18/2018   Anxiety 10/22/2018   Overweight (BMI 25.0-29.9) 10/22/2018   Chronic pain of both knees 01/29/2018   Persistent headaches 11/18/2016   Hiatal hernia with GERD 06/17/2016   Status post laparoscopic Nissen fundoplication 16/38/4665   Pulmonary nodules 05/12/2015   Insomnia 04/20/2015   Hypertension 05/15/2012   Hypothyroidism 05/15/2012   History of colonic polyps 05/15/2012   Anemia 05/15/2012   Heme positive stool 05/15/2012    Social History   Tobacco Use   Smoking status: Never   Smokeless tobacco: Never  Substance Use Topics   Alcohol use: No    Alcohol/week: 0.0 standard drinks    Current Outpatient Medications:    acetaminophen (TYLENOL) 325 MG tablet, Take 325 mg by mouth every 6 (six) hours as needed for mild pain (For pain.). , Disp: , Rfl:  albuterol (VENTOLIN HFA) 108 (90 Base) MCG/ACT inhaler, SMARTSIG:2 Puff(s) By Mouth Every 6 Hours PRN, Disp: , Rfl:    amLODipine (NORVASC) 2.5 MG tablet, TAKE 1 TABLET BY MOUTH  TWICE DAILY, Disp: 60 tablet, Rfl: 0   atorvastatin (LIPITOR) 20 MG tablet, Take 1 tablet (20 mg total) by mouth daily., Disp: 90 tablet, Rfl: 3   cefdinir (OMNICEF) 300 MG capsule, Take 1 capsule (300 mg total) by mouth 2 (two) times daily., Disp: 20 capsule, Rfl: 0   Cyanocobalamin (VITAMIN B 12 PO), Take 1  tablet by mouth daily., Disp: , Rfl:    diclofenac (VOLTAREN) 75 MG EC tablet, Take 1 tablet (75 mg total) by mouth 2 (two) times daily., Disp: 180 tablet, Rfl: 1   esomeprazole (NEXIUM) 40 MG capsule, Take 1 capsule (40 mg total) by mouth daily before breakfast., Disp: 90 capsule, Rfl: 3   famotidine (PEPCID) 40 MG tablet, TAKE 1 TABLET BY MOUTH AT  BEDTIME, Disp: 90 tablet, Rfl: 3   levothyroxine (SYNTHROID) 50 MCG tablet, TAKE 1 TABLET BY MOUTH  DAILY, Disp: 90 tablet, Rfl: 0   losartan (COZAAR) 100 MG tablet, Take 1 tablet (100 mg total) by mouth daily., Disp: 90 tablet, Rfl: 1   nirmatrelvir/ritonavir EUA (PAXLOVID) 20 x 150 MG & 10 x 100MG  TABS, Take 3 tablets by mouth 2 (two) times daily for 5 days. (Take nirmatrelvir 150 mg two tablets twice daily for 5 days and ritonavir 100 mg one tablet twice daily for 5 days) Patient GFR is >60, Disp: 30 tablet, Rfl: 0   pyridOXINE (VITAMIN B-6) 100 MG tablet, Take 100 mg by mouth daily., Disp: , Rfl:    sertraline (ZOLOFT) 100 MG tablet, Take 1 tablet (100 mg total) by mouth at bedtime., Disp: 90 tablet, Rfl: 1   vitamin B-12 (CYANOCOBALAMIN) 1000 MCG tablet, Take 1,000 mcg by mouth daily., Disp: , Rfl:    VITAMIN D-VITAMIN K PO, Take 5,000 Units by mouth every other day., Disp: , Rfl:   Allergies  Allergen Reactions   Hydromorphone Itching, Nausea Only and Other (See Comments)    After IV dilaudid pt flushed and felt dizzy   Ciprofloxacin Other (See Comments)    Stomach problems   Codeine Itching   Latex Other (See Comments)    fainted   Morphine And Related Itching   Nsaids Nausea And Vomiting   Penicillins Itching and Nausea And Vomiting    Has patient had a PCN reaction causing immediate rash, facial/tongue/throat swelling, SOB or lightheadedness with hypotension: No Has patient had a PCN reaction causing severe rash involving mucus membranes or skin necrosis: No Has patient had a PCN reaction that required hospitalization No Has patient  had a PCN reaction occurring within the last 10 years: Yes If all of the above answers are "NO", then may proceed with Cephalosporin use.    Sulfa Antibiotics Nausea And Vomiting    OBJECTIVE: Wt 185 lb (83.9 kg)   BMI 28.13 kg/m  Gen: No acute distress. Nontoxic in appearance.  HENT: AT. .  MMM.  Eyes:Pupils Equal Round Reactive to light, Extraocular movements intact,  Conjunctiva without redness, discharge or icterus. Chest: Cough or shortness of breath not present. Skin: no rashes, purpura or petechiae.  Neuro: Alert. Oriented x3  Psych: Normal affect and demeanor. Normal speech. Normal thought content and judgment.  ASSESSMENT AND PLAN: MEIGAN PATES is a 80 y.o. female present for  COVID-19 Rest, hydrate.  +/- flonase, mucinex (DM if cough), nettie  pot or nasal saline.  Paxlovid x5 d prescribed, take until completed.  She is to hold statin x7 days.  Patient reports understanding. F/U 2 weeks if not improved > sooner if worsening.  Patient was provided with a scription of Omnicef twice daily in the event her symptoms progress over the next 4-5 days without improvement, she is to start [place] at that time only.  Patient reports understanding. Reviewed home care instructions for COVID. Advised self-isolation at home for at least 5 days. After 5 days, if improved and fever resolved, can be in public, but should wear a mask around others for an additional 5 days. If symptoms, esp, dyspnea develops/worsens, recommend in-person evaluation at either an urgent care or the emergency room.  Howard Pouch, DO 03/18/2021   Return if symptoms worsen or fail to improve.  No orders of the defined types were placed in this encounter.  Meds ordered this encounter  Medications   nirmatrelvir/ritonavir EUA (PAXLOVID) 20 x 150 MG & 10 x 100MG  TABS    Sig: Take 3 tablets by mouth 2 (two) times daily for 5 days. (Take nirmatrelvir 150 mg two tablets twice daily for 5 days and ritonavir  100 mg one tablet twice daily for 5 days) Patient GFR is >60    Dispense:  30 tablet    Refill:  0   cefdinir (OMNICEF) 300 MG capsule    Sig: Take 1 capsule (300 mg total) by mouth 2 (two) times daily.    Dispense:  20 capsule    Refill:  0   Referral Orders  No referral(s) requested today

## 2021-03-18 NOTE — Telephone Encounter (Signed)
Pt agreed to VV

## 2021-03-25 ENCOUNTER — Other Ambulatory Visit: Payer: Self-pay | Admitting: Family Medicine

## 2021-03-25 DIAGNOSIS — I10 Essential (primary) hypertension: Secondary | ICD-10-CM

## 2021-03-31 ENCOUNTER — Ambulatory Visit: Payer: Medicare Other | Admitting: Family Medicine

## 2021-04-08 ENCOUNTER — Other Ambulatory Visit (INDEPENDENT_AMBULATORY_CARE_PROVIDER_SITE_OTHER): Payer: Self-pay | Admitting: Gastroenterology

## 2021-04-13 ENCOUNTER — Other Ambulatory Visit: Payer: Self-pay | Admitting: Family Medicine

## 2021-04-26 ENCOUNTER — Ambulatory Visit (INDEPENDENT_AMBULATORY_CARE_PROVIDER_SITE_OTHER): Payer: Medicare Other | Admitting: Family Medicine

## 2021-04-26 ENCOUNTER — Other Ambulatory Visit: Payer: Self-pay

## 2021-04-26 ENCOUNTER — Encounter: Payer: Self-pay | Admitting: Family Medicine

## 2021-04-26 VITALS — BP 113/71 | HR 76 | Temp 98.3°F | Ht 68.0 in | Wt 197.4 lb

## 2021-04-26 DIAGNOSIS — E034 Atrophy of thyroid (acquired): Secondary | ICD-10-CM

## 2021-04-26 DIAGNOSIS — Z131 Encounter for screening for diabetes mellitus: Secondary | ICD-10-CM

## 2021-04-26 DIAGNOSIS — K219 Gastro-esophageal reflux disease without esophagitis: Secondary | ICD-10-CM

## 2021-04-26 DIAGNOSIS — E782 Mixed hyperlipidemia: Secondary | ICD-10-CM | POA: Diagnosis not present

## 2021-04-26 DIAGNOSIS — I10 Essential (primary) hypertension: Secondary | ICD-10-CM | POA: Diagnosis not present

## 2021-04-26 DIAGNOSIS — D509 Iron deficiency anemia, unspecified: Secondary | ICD-10-CM | POA: Diagnosis not present

## 2021-04-26 DIAGNOSIS — D329 Benign neoplasm of meninges, unspecified: Secondary | ICD-10-CM | POA: Diagnosis not present

## 2021-04-26 DIAGNOSIS — Z86010 Personal history of colonic polyps: Secondary | ICD-10-CM | POA: Diagnosis not present

## 2021-04-26 DIAGNOSIS — G4700 Insomnia, unspecified: Secondary | ICD-10-CM | POA: Diagnosis not present

## 2021-04-26 DIAGNOSIS — R918 Other nonspecific abnormal finding of lung field: Secondary | ICD-10-CM

## 2021-04-26 DIAGNOSIS — F419 Anxiety disorder, unspecified: Secondary | ICD-10-CM

## 2021-04-26 DIAGNOSIS — Z0000 Encounter for general adult medical examination without abnormal findings: Secondary | ICD-10-CM | POA: Diagnosis not present

## 2021-04-26 DIAGNOSIS — E663 Overweight: Secondary | ICD-10-CM

## 2021-04-26 LAB — LIPID PANEL
Cholesterol: 157 mg/dL (ref 0–200)
HDL: 54.2 mg/dL (ref >39.00)
LDL Cholesterol: 85 mg/dL (ref 0–99)
NonHDL: 102.95
Total CHOL/HDL Ratio: 3
Triglycerides: 92 mg/dL (ref 0.0–149.0)
VLDL: 18.4 mg/dL (ref 0.0–40.0)

## 2021-04-26 LAB — COMPREHENSIVE METABOLIC PANEL
ALT: 15 U/L (ref 0–35)
AST: 16 U/L (ref 0–37)
Albumin: 4.1 g/dL (ref 3.5–5.2)
Alkaline Phosphatase: 87 U/L (ref 39–117)
BUN: 19 mg/dL (ref 6–23)
CO2: 28 mEq/L (ref 19–32)
Calcium: 9 mg/dL (ref 8.4–10.5)
Chloride: 105 mEq/L (ref 96–112)
Creatinine, Ser: 0.89 mg/dL (ref 0.40–1.20)
GFR: 60.71 mL/min (ref >60.00)
Glucose, Bld: 88 mg/dL (ref 70–99)
Potassium: 4.6 mEq/L (ref 3.5–5.1)
Sodium: 139 mEq/L (ref 135–145)
Total Bilirubin: 0.5 mg/dL (ref 0.2–1.2)
Total Protein: 6.2 g/dL (ref 6.0–8.3)

## 2021-04-26 LAB — HEMOGLOBIN A1C: Hgb A1c MFr Bld: 5.6 % (ref 4.6–6.5)

## 2021-04-26 LAB — VITAMIN D 25 HYDROXY (VIT D DEFICIENCY, FRACTURES): VITD: 56.69 ng/mL (ref 30.00–100.00)

## 2021-04-26 LAB — TSH: TSH: 2.78 u[IU]/mL (ref 0.35–5.50)

## 2021-04-26 MED ORDER — DICLOFENAC SODIUM 75 MG PO TBEC: 75.0000 mg | DELAYED_RELEASE_TABLET | Freq: Two times a day (BID) | ORAL | 1 refills | Status: DC

## 2021-04-26 MED ORDER — ATORVASTATIN CALCIUM 20 MG PO TABS: 20.0000 mg | ORAL_TABLET | Freq: Every day | ORAL | 3 refills | Status: DC

## 2021-04-26 MED ORDER — DONEPEZIL HCL 5 MG PO TBDP: 5.0000 mg | ORAL_TABLET | Freq: Every day | ORAL | 1 refills | Status: DC

## 2021-04-26 MED ORDER — LOSARTAN POTASSIUM 100 MG PO TABS: 100.0000 mg | ORAL_TABLET | Freq: Every day | ORAL | 1 refills | Status: DC

## 2021-04-26 MED ORDER — LEVOTHYROXINE SODIUM 50 MCG PO TABS: 50.0000 ug | ORAL_TABLET | Freq: Every day | ORAL | 3 refills | Status: DC

## 2021-04-26 MED ORDER — AMLODIPINE BESYLATE 2.5 MG PO TABS: 2.5000 mg | ORAL_TABLET | Freq: Two times a day (BID) | ORAL | 1 refills | Status: DC

## 2021-04-26 MED ORDER — SERTRALINE HCL 100 MG PO TABS: 100.0000 mg | ORAL_TABLET | Freq: Every day | ORAL | 1 refills | Status: DC

## 2021-04-26 NOTE — Progress Notes (Signed)
This visit occurred during the SARS-CoV-2 public health emergency.  Safety protocols were in place, including screening questions prior to the visit, additional usage of staff PPE, and extensive cleaning of exam room while observing appropriate contact time as indicated for disinfecting solutions.    Patient ID: Debra Sanchez, female  DOB: February 29, 1941, 81 y.o.   MRN: [id number] Patient Care Team    Relationship Specialty Notifications Start End  Ma Hillock, DO PCP - General Family Medicine  04/20/15   Elease Hashimoto  Neurology  11/27/20   Chesley Mires, MD Consulting Physician Pulmonary Disease  04/26/21     Chief Complaint  Patient presents with   Annual Exam    Pt is not fasting    Subjective:  Debra Sanchez is a 81 y.o.  Female  present for [place] All past medical history, surgical history, allergies, family history, immunizations, medications and social history were updated in the electronic medical record today. All recent labs, ED visits and hospitalizations within the last year were reviewed.  Health maintenance:  Colonoscopy: > 75 N/A Mammogram: completed:declines mammogram.  Cervical cancer screening: > 65 n/A- hysterectomy Immunizations: tdap UTD 2018, Influenza UTD 01/10/2021 (encouraged yearly), PNA series completed, Shingrix x1 only.  DEXA: last completed 2016, result normal Assistive device: none Oxygen LDJ:TTSV Patient has a Dental home. Hospitalizations/ED visits: reviewed  Essential hypertension Pt reports compliance  with Cozaar 100 mg and amlodipine 2.5 g twice a day (her choice) Blood pressures ranges at home are reported WNL. Patient denies chest pain, shortness of breath, dizziness or lower extremity edema.    Pt does not take a daily baby ASA. She is tolerating start of a statin.  Diet: Watches her diet closely Exercise: Exercises routinely RF: Hypertension, obesity, family history heart disease    Anxiety/Insomnia, unspecified  type: Patient reports compliance with Zoloft 100 mg daily. She reports she is feeling well.     Hypothyroidism:  Pt reports complaince  with levo 50 mcg qd on an empty stomach. Pt has been referred to neuro for new meningioma and sleep study, d/t memory changes. She is still having memory changes. She has not had her sleep study yet ,  Prior note. She has noticed over the last month she has had memory changes and she is concerned she is getting dementia. She takes a vit d and daily vitamin. She does wake herself on few times a night gasping for breath. She does snore. Body mass index is 29.44 kg/m. She also had covid 6 months ago.   Depression screen [facility] 2/9 04/26/2021 03/18/2021 03/18/2020 09/16/2019 03/18/2019  Decreased Interest 0 0 0 0 0  Down, Depressed, Hopeless 0 0 0 0 0  PHQ - 2 Score 0 0 0 0 0  Altered sleeping - - - - -  Tired, decreased energy - - - - -  Change in appetite - - - - -  Feeling bad or failure about yourself  - - - - -  Trouble concentrating - - - - -  Moving slowly or fidgety/restless - - - - -  Suicidal thoughts - - - - -  PHQ-9 Score - - - - -   GAD 7 : Generalized Anxiety Score 04/26/2021 09/16/2019 03/18/2019  Nervous, Anxious, on Edge 0 0 0  Control/stop worrying 0 0 0  Worry too much - different things 0 0 0  Trouble relaxing 0 0 0  Restless 0 0 0  Easily annoyed or irritable 0  0 0  Afraid - awful might happen 0 0 0  Total GAD 7 Score 0 0 0  Anxiety Difficulty Not difficult at all Not difficult at all Not difficult at all    Immunization History  Administered Date(s) Administered   Fluad Quad(high Dose 65+) 03/18/2020   Influenza Split 03/08/2020   Influenza, High Dose Seasonal PF 01/16/2017, 01/29/2018, 02/08/2019   Influenza,inj,Quad PF,6+ Mos 01/29/2018   Influenza-Unspecified 02/10/2015, 03/18/2020, 01/09/2021   Pneumococcal Conjugate-13 12/23/2013, 03/18/2019   Pneumococcal Polysaccharide-23 01/29/2018   Tdap 04/12/2006, 11/26/2016    Zoster Recombinat (Shingrix) 02/08/2019     Past Medical History:  Diagnosis Date   Anemia    Anxiety    Arthritis    Blood transfusion 2012   Colon polyp    COVID-19 03/2019   DDD (degenerative disc disease), thoracolumbar    Degenerative disc disease, lumbar    Family history of adverse reaction to anesthesia    pts sister got very sick / burnt esophagus    Gastric ulcer    GERD (gastroesophageal reflux disease)    Heart murmur    History of hiatal hernia    Hypertension    Hypothyroidism    Migraines    Miscarriage    Osteoporosis    Pneumonia yrs ago   Syncope 2012   Wears glasses    Allergies  Allergen Reactions   Hydromorphone Itching, Nausea Only and Other (See Comments)    After IV dilaudid pt flushed and felt dizzy   Ciprofloxacin Other (See Comments)    Stomach problems   Codeine Itching   Latex Other (See Comments)    fainted   Morphine And Related Itching   Nsaids Nausea And Vomiting   Penicillins Itching and Nausea And Vomiting    Has patient had a PCN reaction causing immediate rash, facial/tongue/throat swelling, SOB or lightheadedness with hypotension: No Has patient had a PCN reaction causing severe rash involving mucus membranes or skin necrosis: No Has patient had a PCN reaction that required hospitalization No Has patient had a PCN reaction occurring within the last 10 years: Yes If all of the above answers are "NO", then may proceed with Cephalosporin use.    Sulfa Antibiotics Nausea And Vomiting   Past Surgical History:  Procedure Laterality Date   ABDOMINAL HYSTERECTOMY     ANTERIOR CERVICAL DECOMP/DISCECTOMY FUSION     C5-6 and C6-7 anterior cervical diskectomy and fusion    BILATERAL OOPHORECTOMY     CATARACT EXTRACTION W/PHACO Left 10/20/2014   Procedure: CATARACT EXTRACTION PHACO AND INTRAOCULAR LENS PLACEMENT LEFT EYE;  Surgeon: Tonny Branch, MD;  Location: [facility];  Service: Ophthalmology;  Laterality:  Left;  CDE:6.98   CATARACT EXTRACTION W/PHACO Right 11/17/2014   Procedure: CATARACT EXTRACTION PHACO AND INTRAOCULAR LENS PLACEMENT RIGHT EYE CDE=6.57;  Surgeon: Tonny Branch, MD;  Location: [facility];  Service: Ophthalmology;  Laterality: Right;   COLONOSCOPY  01/19/2011   Procedure: COLONOSCOPY;  Surgeon: Rogene Houston, MD;  Location: [facility];  Service: Endoscopy;  Laterality: N/A;  2:00   DILATION AND CURETTAGE OF UTERUS     ESOPHAGOGASTRODUODENOSCOPY  11/05/2010   Procedure: ESOPHAGOGASTRODUODENOSCOPY (EGD);  Surgeon: Rogene Houston, MD;  [facility]: [facility];  Service: Endoscopy;  Laterality: N/A;  7:00 am per Melanie   ESOPHAGOGASTRODUODENOSCOPY N/A 07/25/2012   Procedure: ESOPHAGOGASTRODUODENOSCOPY (EGD);  Surgeon: Rogene Houston, MD;  [facility];  Service: Endoscopy;  Laterality: N/A;  400-moved to 21 Ann notified pt  ESOPHAGOGASTRODUODENOSCOPY N/A 05/27/2015   Procedure: ESOPHAGOGASTRODUODENOSCOPY (EGD);  Surgeon: Rogene Houston, MD;  [facility]: [facility];  Service: Endoscopy;  Laterality: N/A;  2:00   HIATAL HERNIA REPAIR N/A 08/25/2015   Procedure: LAPAROSCOPIC REPAIR OF LARGE TYPE III  HIATAL HERNIA;  Surgeon: Johnathan Hausen, MD;  Location: [facility];  Service: General;  Laterality: N/A;   LAPAROSCOPIC NISSEN FUNDOPLICATION N/A 08/12/6642   Procedure: ENDOSCOPY, HIATAL HERNIA REPAIR, AND TAKE DOWN OF NISSEN FUNDOPILICATION;  Surgeon: Johnathan Hausen, MD;  Location: [facility];  Service: General;  Laterality: N/A;   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   2 children Kerin Ransom and Anne Ng)    Retired, 2 yrs college.    Caffeine beverages.   Takes a daily vitamin.   Wears her seatbelt. Smoke detector in the home.    Feels safe in her relationships.       Right handed              Allergies as of 04/26/2021       Reactions   Hydromorphone Itching, Nausea Only, Other (See Comments)   After IV dilaudid pt flushed and felt dizzy   Ciprofloxacin Other (See Comments)   Stomach problems   Codeine Itching   Latex Other (See Comments)   fainted   Morphine And Related Itching   Nsaids Nausea And Vomiting   Penicillins Itching, Nausea And Vomiting   Has patient had a PCN reaction causing immediate rash, facial/tongue/throat swelling, SOB or lightheadedness with hypotension: No Has patient had a PCN reaction causing severe rash involving mucus membranes or skin necrosis: No Has patient had a PCN reaction that required hospitalization No Has patient had a PCN reaction occurring within the last 10 years: Yes If all of the above answers are "NO", then may proceed with Cephalosporin use.   Sulfa Antibiotics Nausea And Vomiting        Medication List        Accurate as of April 26, 2021 11:24 AM. If you have any questions, ask your nurse or doctor.          STOP taking these medications    cefdinir 300 MG capsule Commonly known as: OMNICEF Stopped by: Howard Pouch, DO       TAKE these medications    acetaminophen 325 MG tablet Commonly known as: TYLENOL Take 325 mg by mouth every 6 (six) hours as needed for mild pain (For pain.).   albuterol 108 (90 Base) MCG/ACT inhaler Commonly known as: VENTOLIN HFA SMARTSIG:2 Puff(s) By Mouth Every 6 Hours PRN   amLODipine 2.5 MG tablet Commonly known as: NORVASC Take 1 tablet (2.5 mg total) by mouth 2 (two) times daily.   atorvastatin 20 MG tablet Commonly known as: LIPITOR Take 1 tablet (20 mg total) by mouth daily.    diclofenac 75 MG EC tablet Commonly known  as: VOLTAREN Take 1 tablet (75 mg total) by mouth 2 (two) times daily.   donepezil 5 MG disintegrating tablet Commonly known as: ARICEPT ODT Take 1 tablet (5 mg total) by mouth at bedtime. Started by: Howard Pouch, DO   esomeprazole 40 MG capsule Commonly known as: NexIUM Take 1 capsule (40 mg total) by mouth daily before breakfast.   famotidine 40 MG tablet Commonly known as: PEPCID TAKE 1 TABLET BY MOUTH AT  BEDTIME   levothyroxine 50 MCG tablet Commonly known as: SYNTHROID Take 1 tablet (50 mcg total) by mouth daily.   losartan 100 MG tablet Commonly known as: COZAAR Take 1 tablet (100 mg total) by mouth daily.   pyridOXINE 100 MG tablet Commonly known as: VITAMIN B-6 Take 100 mg by mouth daily.   sertraline 100 MG tablet Commonly known as: ZOLOFT Take 1 tablet (100 mg total) by mouth at bedtime.   vitamin B-12 1000 MCG tablet Commonly known as: CYANOCOBALAMIN Take 1,000 mcg by mouth daily.   VITAMIN B 12 PO Take 1 tablet by mouth daily.   VITAMIN D-VITAMIN K PO Take 5,000 Units by mouth every other day.        All past medical history, surgical history, allergies, family history, immunizations andmedications were updated in the EMR today and reviewed under the history and medication portions of their EMR.     No results found for this or any previous visit (from the past 2160 hour(s)).  No results found.   ROS 14 pt review of systems performed and negative (unless mentioned in an HPI)  Objective:  BP 113/71    Pulse 76    Temp 98.3 F (36.8 C)    Ht 5\' 8"  (1.727 m)    Wt 197 lb 6.4 oz (89.5 kg)    SpO2 98%    BMI 30.01 kg/m   Physical Exam Vitals and nursing note reviewed.  Constitutional:      General: She is not in acute distress.    Appearance: Normal appearance. She is not ill-appearing or toxic-appearing.  HENT:     Head: Normocephalic and atraumatic.     Right Ear: Tympanic membrane, ear canal  and external ear normal. There is no impacted cerumen.     Left Ear: Tympanic membrane, ear canal and external ear normal. There is no impacted cerumen.     Nose: No congestion or rhinorrhea.     Mouth/Throat:     Mouth: Mucous membranes are moist.     Pharynx: Oropharynx is clear. No oropharyngeal exudate or posterior oropharyngeal erythema.  Eyes:     General: No scleral icterus.       Right eye: No discharge.        Left eye: No discharge.     Extraocular Movements: Extraocular movements intact.     Conjunctiva/sclera: Conjunctivae normal.     Pupils: Pupils are equal, round, and reactive to light.  Cardiovascular:     Rate and Rhythm: Normal rate and regular rhythm.     Pulses: Normal pulses.     Heart sounds: Normal heart sounds. No murmur heard.   No friction rub. No gallop.  Pulmonary:     Effort: Pulmonary effort is normal. No respiratory distress.     Breath sounds: Normal breath sounds. No stridor. No wheezing, rhonchi or rales.  Chest:     Chest wall: No tenderness.  Abdominal:     General: Abdomen is flat. Bowel sounds are normal. There is no distension.  Palpations: Abdomen is soft. There is no mass.     Tenderness: There is no abdominal tenderness. There is no right CVA tenderness, left CVA tenderness, guarding or rebound.     Hernia: No hernia is present.  Musculoskeletal:        General: No swelling, tenderness or deformity. Normal range of motion.     Cervical back: Normal range of motion and neck supple. No rigidity or tenderness.     Right lower leg: No edema.     Left lower leg: No edema.  Lymphadenopathy:     Cervical: No cervical adenopathy.  Skin:    General: Skin is warm and dry.     Coloration: Skin is not jaundiced or pale.     Findings: No bruising, erythema, lesion or rash.  Neurological:     General: No focal deficit present.     Mental Status: She is alert and oriented to person, place, and time. Mental status is at baseline.     Cranial  Nerves: No cranial nerve deficit.     Sensory: No sensory deficit.     Motor: No weakness.     Coordination: Coordination normal.     Gait: Gait normal.     Deep Tendon Reflexes: Reflexes normal.  Psychiatric:        Mood and Affect: Mood normal.        Behavior: Behavior normal.        Thought Content: Thought content normal.        Judgment: Judgment normal.     No results found.  Assessment/plan: LIOR CARTELLI is a 81 y.o. female present for CPE/cmc Anxiety/insomnia Stable. Continue zoloft 100 mg QD   Essential hypertension/HLD/on statin therapy/overweight Stable. Continue Losartan 100 mg Qd  Continue amlodipine 2.5 BID Continue liptor 20 mg qd Discussed weight loss- dietary modifications and exercise.    Hypothyroidism, unspecified type  - continue  levothyroxine 50 mcg daily > she is having some memory issues.  TSH collected today. 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 She seem to have symptoms of sleep apnea which may also be contributing.  - now est with neuro and pulm.  - encouraged her to have the sleep study completed with Dr. Halford Chessman.  - start aricpet 5 mg qhs.   Iron deficiency anemia, unspecified iron deficiency anemia type - iron panel collected today  Pulmonary nodules Low risk of ca, pul nodules had been stable. No further image needed.   Diabetes mellitus screening - Hemoglobin A1c Hypocalcemia - Vitamin D (25 hydroxy)  Routine general medical examination at a health care facility Colonoscopy: > 75 N/A Mammogram: completed:declines mammogram.  Cervical cancer screening: > 65 n/A- hysterectomy Immunizations: tdap UTD 2018, Influenza UTD 01/10/2021 (encouraged yearly), PNA series completed, Shingrix x1 only.  DEXA: last completed 2016, result normal Patient was encouraged to exercise greater than 150 minutes a week. Patient was encouraged to choose a  diet filled with fresh fruits and vegetables, and lean meats. AVS provided to patient today for education/recommendation on gender specific health and safety maintenance.  Return in about 24 weeks (around 10/11/2021) for [facility]MC (30 min).   Orders Placed This Encounter  Procedures   Comprehensive metabolic panel   Hemoglobin A1c   Lipid panel   TSH   Vitamin D (25 hydroxy)   Iron, TIBC and Ferritin Panel   Meds ordered this encounter  Medications  amLODipine (NORVASC) 2.5 MG tablet    Sig: Take 1 tablet (2.5 mg total) by mouth 2 (two) times daily.    Dispense:  90 tablet    Refill:  1   atorvastatin (LIPITOR) 20 MG tablet    Sig: Take 1 tablet (20 mg total) by mouth daily.    Dispense:  90 tablet    Refill:  3   diclofenac (VOLTAREN) 75 MG EC tablet    Sig: Take 1 tablet (75 mg total) by mouth 2 (two) times daily.    Dispense:  180 tablet    Refill:  1   levothyroxine (SYNTHROID) 50 MCG tablet    Sig: Take 1 tablet (50 mcg total) by mouth daily.    Dispense:  90 tablet    Refill:  3    MUST KEEP UPCOMING APPT   losartan (COZAAR) 100 MG tablet    Sig: Take 1 tablet (100 mg total) by mouth daily.    Dispense:  90 tablet    Refill:  1   sertraline (ZOLOFT) 100 MG tablet    Sig: Take 1 tablet (100 mg total) by mouth at bedtime.    Dispense:  90 tablet    Refill:  1   donepezil (ARICEPT ODT) 5 MG disintegrating tablet    Sig: Take 1 tablet (5 mg total) by mouth at bedtime.    Dispense:  90 tablet    Refill:  1   Referral Orders  No referral(s) requested today     Electronically signed by: Howard Pouch, [place]

## 2021-04-26 NOTE — Progress Notes (Signed)
Shingrix #2

## 2021-04-26 NOTE — Patient Instructions (Signed)
Health Maintenance After Age 82 After age 82, you are at a higher risk for certain long-term diseases and infections as well as injuries from falls. Falls are a major cause of broken bones and head injuries in people who are older than age 82. Getting regular preventive care can help to keep you healthy and well. Preventive care includes getting regular testing and making lifestyle changes as recommended by your health care provider. Talk with your health care provider about: Which screenings and tests you should have. A screening is a test that checks for a disease when you have no symptoms. A diet and exercise plan that is right for you. What should I know about screenings and tests to prevent falls? Screening and testing are the best ways to find a health problem early. Early diagnosis and treatment give you the best chance of managing medical conditions that are common after age 82. Certain conditions and lifestyle choices may make you more likely to have a fall. Your health care provider may recommend: Regular vision checks. Poor vision and conditions such as cataracts can make you more likely to have a fall. If you wear glasses, make sure to get your prescription updated if your vision changes. Medicine review. Work with your health care provider to regularly review all of the medicines you are taking, including over-the-counter medicines. Ask your health care provider about any side effects that may make you more likely to have a fall. Tell your health care provider if any medicines that you take make you feel dizzy or sleepy. Strength and balance checks. Your health care provider may recommend certain tests to check your strength and balance while standing, walking, or changing positions. Foot health exam. Foot pain and numbness, as well as not wearing proper footwear, can make you more likely to have a fall. Screenings, including: Osteoporosis screening. Osteoporosis is a condition that causes  the bones to get weaker and break more easily. Blood pressure screening. Blood pressure changes and medicines to control blood pressure can make you feel dizzy. Depression screening. You may be more likely to have a fall if you have a fear of falling, feel depressed, or feel unable to do activities that you used to do. Alcohol use screening. Using too much alcohol can affect your balance and may make you more likely to have a fall. Follow these instructions at home: Lifestyle Do not drink alcohol if: Your health care provider tells you not to drink. If you drink alcohol: Limit how much you have to: 0-1 drink a day for women. 0-2 drinks a day for men. Know how much alcohol is in your drink. In the [place] one drink equals one 12 oz bottle of beer (355 mL), one 5 oz glass of wine (148 mL), or one 1 oz glass of hard liquor (44 mL). Do not use any products that contain nicotine or tobacco. These products include cigarettes, chewing tobacco, and vaping devices, such as e-cigarettes. If you need help quitting, ask your health care provider. Activity  Follow a regular exercise program to stay fit. This will help you maintain your balance. Ask your health care provider what types of exercise are appropriate for you. If you need a cane or walker, use it as recommended by your health care provider. Wear supportive shoes that have nonskid soles. Safety  Remove any tripping hazards, such as rugs, cords, and clutter. Install safety equipment such as grab bars in bathrooms and safety rails on stairs. Keep rooms and walkways   well-lit. General instructions Talk with your health care provider about your risks for falling. Tell your health care provider if: You fall. Be sure to tell your health care provider about all falls, even ones that seem minor. You feel dizzy, tiredness (fatigue), or off-balance. Take over-the-counter and prescription medicines only as told by your health care provider. These include  supplements. Eat a healthy diet and maintain a healthy weight. A healthy diet includes low-fat dairy products, low-fat (lean) meats, and fiber from whole grains, beans, and lots of fruits and vegetables. Stay current with your vaccines. Schedule regular health, dental, and eye exams. Summary Having a healthy lifestyle and getting preventive care can help to protect your health and wellness after age 82. Screening and testing are the best way to find a health problem early and help you avoid having a fall. Early diagnosis and treatment give you the best chance for managing medical conditions that are more common for people who are older than age 82. Falls are a major cause of broken bones and head injuries in people who are older than age 82. Take precautions to prevent a fall at home. Work with your health care provider to learn what changes you can make to improve your health and wellness and to prevent falls. This information is not intended to replace advice given to you by your health care provider. Make sure you discuss any questions you have with your health care provider. Document Revised: 08/17/2020 Document Reviewed: 08/17/2020 Elsevier Patient Education  2022 [employer]  

## 2021-04-27 LAB — IRON,TIBC AND FERRITIN PANEL
%SAT: 24 % (calc) (ref 16–45)
Ferritin: 12 ng/mL — ABNORMAL LOW (ref 16–288)
Iron: 59 ug/dL (ref 45–160)
TIBC: 246 mcg/dL (calc) — ABNORMAL LOW (ref 250–450)

## 2021-04-28 ENCOUNTER — Telehealth: Payer: Self-pay | Admitting: Family Medicine

## 2021-04-28 ENCOUNTER — Telehealth: Payer: Self-pay | Admitting: Physician Assistant

## 2021-04-28 ENCOUNTER — Telehealth: Payer: Self-pay

## 2021-04-28 NOTE — Telephone Encounter (Signed)
-----   Message from [place] sent at 04/28/2021 10:41 AM EST ----- Spoke with patient regarding results/recommendations.  Pt states that she is supposed to have a brain scan done but has not been scheduled for it. I do not see orders for it to be done. She says she forgot to talk about it in her appt.

## 2021-04-28 NOTE — Telephone Encounter (Signed)
Spoke with patient regarding results/recommendations.   Pt states that she is supposed to have a brain scan done but has not been scheduled for it. I do not see orders for it to be done. She says she forgot to talk about it in her appt.       In reference to the above phone note, patient spoke to her neurology team about repeat MRI.  They would be the ones to schedule the MRI.  I would advise her to call them to verify orders and when they wanted her to complete.  In addition her pulmonology team are the ones to complete the sleep apnea orders and arrange testing.

## 2021-04-28 NOTE — Telephone Encounter (Signed)
Patient called wanting to know if she needs a brain scan before her next appt.  Please call [phone].

## 2021-04-28 NOTE — Telephone Encounter (Signed)
Patient calling back from voicemail left yesterday regarding lab results.  Please call [phone]

## 2021-04-28 NOTE — Telephone Encounter (Signed)
Spoke with patient regarding results/recommendations.  

## 2021-04-29 NOTE — Telephone Encounter (Signed)
I advised to patient of MRI time frame, she will discuss this with Clarise Cruz on her follow up

## 2021-05-06 ENCOUNTER — Other Ambulatory Visit: Payer: Self-pay

## 2021-05-31 ENCOUNTER — Ambulatory Visit: Payer: Medicare Other | Admitting: Physician Assistant

## 2021-06-27 ENCOUNTER — Other Ambulatory Visit: Payer: Self-pay | Admitting: Family Medicine

## 2021-06-27 DIAGNOSIS — I10 Essential (primary) hypertension: Secondary | ICD-10-CM

## 2021-07-07 ENCOUNTER — Telehealth: Payer: Self-pay

## 2021-07-07 NOTE — Telephone Encounter (Signed)
Patient called to cancel her appt and her spouse appt for medicare well visits on 4/1.  Patient declined to schedule wellness visit at this time.   ?

## 2021-07-07 NOTE — Telephone Encounter (Signed)
Noted  

## 2021-07-07 NOTE — Telephone Encounter (Signed)
noted 

## 2021-07-10 ENCOUNTER — Ambulatory Visit: Payer: Medicare Other

## 2021-07-20 ENCOUNTER — Other Ambulatory Visit (INDEPENDENT_AMBULATORY_CARE_PROVIDER_SITE_OTHER): Payer: Self-pay | Admitting: Internal Medicine

## 2021-08-09 ENCOUNTER — Ambulatory Visit: Payer: Medicare Other | Admitting: Physician Assistant

## 2021-08-30 ENCOUNTER — Ambulatory Visit: Payer: Medicare Other | Admitting: Physician Assistant

## 2021-08-31 ENCOUNTER — Ambulatory Visit (INDEPENDENT_AMBULATORY_CARE_PROVIDER_SITE_OTHER): Payer: Medicare Other | Admitting: Internal Medicine

## 2021-08-31 ENCOUNTER — Encounter (INDEPENDENT_AMBULATORY_CARE_PROVIDER_SITE_OTHER): Payer: Self-pay | Admitting: Internal Medicine

## 2021-08-31 VITALS — BP 99/66 | HR 77 | Temp 99.0°F | Ht 68.0 in | Wt 193.4 lb

## 2021-08-31 DIAGNOSIS — K219 Gastro-esophageal reflux disease without esophagitis: Secondary | ICD-10-CM

## 2021-08-31 DIAGNOSIS — Z13820 Encounter for screening for osteoporosis: Secondary | ICD-10-CM

## 2021-08-31 DIAGNOSIS — Z86010 Personal history of colonic polyps: Secondary | ICD-10-CM

## 2021-08-31 LAB — CBC WITH DIFFERENTIAL/PLATELET
Absolute Monocytes: 579 cells/uL (ref 200–950)
Basophils Absolute: 33 cells/uL (ref 0–200)
Basophils Relative: 0.5 %
Eosinophils Absolute: 91 cells/uL (ref 15–500)
Eosinophils Relative: 1.4 %
HCT: 39.3 % (ref 35.0–45.0)
Hemoglobin: 13 g/dL (ref 11.7–15.5)
Lymphs Abs: 1638 cells/uL (ref 850–3900)
MCH: 31.5 pg (ref 27.0–33.0)
MCHC: 33.1 g/dL (ref 32.0–36.0)
MCV: 95.2 fL (ref 80.0–100.0)
MPV: 11 fL (ref 7.5–12.5)
Monocytes Relative: 8.9 %
Neutro Abs: 4160 cells/uL (ref 1500–7800)
Neutrophils Relative %: 64 %
Platelets: 195 10*3/uL (ref 140–400)
RBC: 4.13 10*6/uL (ref 3.80–5.10)
RDW: 12.5 % (ref 11.0–15.0)
Total Lymphocyte: 25.2 %
WBC: 6.5 10*3/uL (ref 3.8–10.8)

## 2021-08-31 MED ORDER — ESOMEPRAZOLE MAGNESIUM 40 MG PO CPDR: 40.0000 mg | DELAYED_RELEASE_CAPSULE | Freq: Every day | ORAL | 3 refills | Status: DC

## 2021-08-31 NOTE — Progress Notes (Signed)
Presenting complaint;  Follow-up for chronic GERD. Patient also interested in scheduling colonoscopy.  Database and subjective:  Patient is 81 year old Caucasian female who is here for scheduled visit.  She was last seen in the office in July 2022. She has chronic GERD known large hiatal hernia and in 2 failed antireflux surgeries.  She has been maintained on antireflux measures and a PPI. Patient states she ran out of her Nexium few days ago and noted terrible heartburn and regurgitation and vomiting.  However she did not experience hematemesis melena or rectal bleeding.  She denies abdominal pain.  Bowels move daily.  Her appetite is good.  She is watching her diet.  She has lost 4 pounds since her last visit. She does not take NSAIDs. Patient states her sister was recently diagnosed with colon carcinoma and died.  She was 82 years old.  She therefore wants to proceed with colonoscopy.  Last exam was in August 2012 with removal of 6 mm tubular adenoma.  She was sent a reminder in 2017 but she did not respond.  Current Medications: Outpatient Encounter Medications as of 08/31/2021  Medication Sig   acetaminophen (TYLENOL) 325 MG tablet Take 325 mg by mouth every 6 (six) hours as needed for mild pain (For pain.).    albuterol (VENTOLIN HFA) 108 (90 Base) MCG/ACT inhaler SMARTSIG:2 Puff(s) By Mouth Every 6 Hours PRN   amLODipine (NORVASC) 2.5 MG tablet TAKE 1 TABLET BY MOUTH TWICE  DAILY   atorvastatin (LIPITOR) 20 MG tablet Take 1 tablet (20 mg total) by mouth daily.   donepezil (ARICEPT ODT) 5 MG disintegrating tablet Take 1 tablet (5 mg total) by mouth at bedtime.   esomeprazole (NEXIUM) 40 MG capsule TAKE 1 CAPSULE BY MOUTH  DAILY BEFORE BREAKFAST   famotidine (PEPCID) 40 MG tablet TAKE 1 TABLET BY MOUTH AT  BEDTIME   levothyroxine (SYNTHROID) 50 MCG tablet Take 1 tablet (50 mcg total) by mouth daily.   losartan (COZAAR) 100 MG tablet Take 1 tablet (100 mg total) by mouth daily.    pyridOXINE (VITAMIN B-6) 100 MG tablet Take 100 mg by mouth daily.   sertraline (ZOLOFT) 100 MG tablet Take 1 tablet (100 mg total) by mouth at bedtime.   vitamin B-12 (CYANOCOBALAMIN) 1000 MCG tablet Take 1,000 mcg by mouth daily.   VITAMIN D-VITAMIN K PO Take 5,000 Units by mouth every other day.   [DISCONTINUED] Cyanocobalamin (VITAMIN B 12 PO) Take 1 tablet by mouth daily.   diclofenac (VOLTAREN) 75 MG EC tablet Take 1 tablet (75 mg total) by mouth 2 (two) times daily. (Patient not taking: Reported on 08/31/2021)   No facility-administered encounter medications on file as of 08/31/2021.     Objective: Blood pressure 99/66, pulse 77, temperature 99 F (37.2 C), temperature source Oral, height 5' 8"  (1.727 m), weight 193 lb 6.4 oz (87.7 kg). Patient is alert and in no acute distress. Conjunctiva is pink. Sclera is nonicteric Oropharyngeal mucosa is normal. No neck masses or thyromegaly noted. Cardiac exam with regular rhythm normal S1 and S2.  She has faint systolic murmur at aortic area. Lungs are clear to auscultation. Abdomen is symmetrical soft and nontender with organomegaly or masses. No LE edema or clubbing noted.  Labs/studies Results:      Latest Ref Rng & Units 01/21/2021    1:19 PM 03/18/2020   10:28 AM 09/27/2018   12:29 PM  CBC  WBC 4.0 - 10.5 K/uL 5.5   5.1   6.6  Hemoglobin 12.0 - 15.0 g/dL 12.7   12.2   11.1    Hematocrit 36.0 - 46.0 % 38.4   37.0   35.6    Platelets 150 - 400 K/uL 174   193.0   173         Latest Ref Rng & Units 04/26/2021   11:23 AM 01/21/2021    1:19 PM 10/02/2020    1:41 PM  CMP  Glucose 70 - 99 mg/dL 88   95   90    BUN 6 - 23 mg/dL 19   15   10     Creatinine 0.40 - 1.20 mg/dL 0.89   0.85   0.87    Sodium 135 - 145 mEq/L 139   137   138    Potassium 3.5 - 5.1 mEq/L 4.6   3.9   4.1    Chloride 96 - 112 mEq/L 105   105   104    CO2 19 - 32 mEq/L 28   27   26     Calcium 8.4 - 10.5 mg/dL 9.0   8.7   9.3     9.3    Total Protein 6.0 -  8.3 g/dL 6.2    6.2    Total Bilirubin 0.2 - 1.2 mg/dL 0.5    0.4    Alkaline Phos 39 - 117 U/L 87      AST 0 - 37 U/L 16    13    ALT 0 - 35 U/L 15    10         Latest Ref Rng & Units 04/26/2021   11:23 AM 10/02/2020    1:41 PM 03/18/2020   10:28 AM  Hepatic Function  Total Protein 6.0 - 8.3 g/dL 6.2   6.2   6.1    Albumin 3.5 - 5.2 g/dL 4.1    4.0    AST 0 - 37 U/L 16   13   11     ALT 0 - 35 U/L 15   10   11     Alk Phosphatase 39 - 117 U/L 87    74    Total Bilirubin 0.2 - 1.2 mg/dL 0.5   0.4   0.5       Assessment:  #1.  Chronic GERD.  Status post antireflux surgery twice in [place] each time.  She has done well with esomeprazole and famotidine.  Therefore prescription will be refilled.  #2.  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  She did not undergo follow-up exam in 2017 recommended.  She recently lost her sister at age 52 due to advanced colon cancer.  Therefore be reasonable to proceed with surveillance colonoscopy.  Plan:  New prescription given for esomeprazole 40 mg by mouth 30 minutes before breakfast daily 90 doses with 3 refills. She will continue famotidine 40 mg at bedtime. CBC. Bone density study. Schedule surveillance colonoscopy in near future. Office visit in 1 year.

## 2021-08-31 NOTE — Patient Instructions (Signed)
Physician will call with results of blood test and bone density study when completed Colonoscopy to be scheduled in near future.

## 2021-09-01 ENCOUNTER — Telehealth (INDEPENDENT_AMBULATORY_CARE_PROVIDER_SITE_OTHER): Payer: Self-pay

## 2021-09-01 ENCOUNTER — Encounter (INDEPENDENT_AMBULATORY_CARE_PROVIDER_SITE_OTHER): Payer: Self-pay

## 2021-09-01 ENCOUNTER — Other Ambulatory Visit (INDEPENDENT_AMBULATORY_CARE_PROVIDER_SITE_OTHER): Payer: Self-pay

## 2021-09-01 DIAGNOSIS — Z86010 Personal history of colonic polyps: Secondary | ICD-10-CM

## 2021-09-01 MED ORDER — NA SULFATE-K SULFATE-MG SULF 17.5-3.13-1.6 GM/177ML PO SOLN: 1.0000 | ORAL | 0 refills | Status: DC

## 2021-09-01 NOTE — Telephone Encounter (Signed)
Ezrah Dembeck Ann Rudi Knippenberg, CMA  ?

## 2021-09-02 ENCOUNTER — Encounter (INDEPENDENT_AMBULATORY_CARE_PROVIDER_SITE_OTHER): Payer: Self-pay

## 2021-09-10 NOTE — Patient Instructions (Signed)
   Your procedure is scheduled on: 09/15/2021  Report to [facility] Entrance at   11:00  AM.  Call this number if you have problems the morning of surgery: [phone]   Remember:              Follow Directions on the letter you received from Your Physician's office regarding the Bowel Prep              No Smoking the day of Procedure :   Take these medicines the morning of surgery with A SIP OF WATER: Amlodipine, nexium, levothyroxine, and zoloft   Do not wear jewelry, make-up or nail polish.    Do not bring valuables to the hospital.  Contacts, dentures or bridgework may not be worn into surgery.  .   Patients discharged the day of surgery will not be allowed to drive home.     Colonoscopy, Adult, Care After This sheet gives you information about how to care for yourself after your procedure. Your health care provider may also give you more specific instructions. If you have problems or questions, contact your health care provider. What can I expect after the procedure? After the procedure, it is common to have: A small amount of blood in your stool for 24 hours after the procedure. Some gas. Mild abdominal cramping or bloating.  Follow these instructions at home: General instructions  For the first 24 hours after the procedure: Do not drive or use machinery. Do not sign important documents. Do not drink alcohol. Do your regular daily activities at a slower pace than normal. Eat soft, easy-to-digest foods. Rest often. Take over-the-counter or prescription medicines only as told by your health care provider. It is up to you to get the results of your procedure. Ask your health care provider, or the department performing the procedure, when your results will be ready. Relieving cramping and bloating Try walking around when you have cramps or feel bloated. Apply heat to your abdomen as told by your health care provider. Use a heat source that your health care provider  recommends, such as a moist heat pack or a heating pad. Place a towel between your skin and the heat source. Leave the heat on for 20-30 minutes. Remove the heat if your skin turns bright red. This is especially important if you are unable to feel pain, heat, or cold. You may have a greater risk of getting burned. Eating and drinking Drink enough fluid to keep your urine clear or pale yellow. Resume your normal diet as instructed by your health care provider. Avoid heavy or fried foods that are hard to digest. Avoid drinking alcohol for as long as instructed by your health care provider. Contact a health care provider if: You have blood in your stool 2-3 days after the procedure. Get help right away if: You have more than a small spotting of blood in your stool. You pass large blood clots in your stool. Your abdomen is swollen. You have nausea or vomiting. You have a fever. You have increasing abdominal pain that is not relieved with medicine. This information is not intended to replace advice given to you by your health care provider. Make sure you discuss any questions you have with your health care provider. Document Released: 11/10/2003 Document Revised: 12/21/2015 Document Reviewed: 06/09/2015 Elsevier Interactive Patient Education  [employer].

## 2021-09-13 ENCOUNTER — Ambulatory Visit (HOSPITAL_COMMUNITY)
Admission: RE | Admit: 2021-09-13 | Discharge: 2021-09-13 | Disposition: A | Discharge status: Home / Self Care | Payer: Medicare Other | Source: Ambulatory Visit | Attending: Internal Medicine | Admitting: Internal Medicine

## 2021-09-13 ENCOUNTER — Encounter (HOSPITAL_COMMUNITY): Payer: Self-pay

## 2021-09-13 ENCOUNTER — Encounter (HOSPITAL_COMMUNITY)
Admission: RE | Admit: 2021-09-13 | Discharge: 2021-09-13 | Disposition: A | Discharge status: Home / Self Care | Payer: Medicare Other | Source: Ambulatory Visit | Attending: Internal Medicine | Admitting: Internal Medicine

## 2021-09-13 DIAGNOSIS — I10 Essential (primary) hypertension: Secondary | ICD-10-CM | POA: Insufficient documentation

## 2021-09-13 DIAGNOSIS — Z780 Asymptomatic menopausal state: Secondary | ICD-10-CM | POA: Diagnosis not present

## 2021-09-13 DIAGNOSIS — M85832 Other specified disorders of bone density and structure, left forearm: Secondary | ICD-10-CM | POA: Insufficient documentation

## 2021-09-13 DIAGNOSIS — Z13820 Encounter for screening for osteoporosis: Secondary | ICD-10-CM | POA: Insufficient documentation

## 2021-09-15 ENCOUNTER — Encounter (INDEPENDENT_AMBULATORY_CARE_PROVIDER_SITE_OTHER): Payer: Self-pay | Admitting: *Deleted

## 2021-09-15 ENCOUNTER — Ambulatory Visit (HOSPITAL_COMMUNITY)
Admission: RE | Admit: 2021-09-15 | Discharge: 2021-09-15 | Disposition: A | Discharge status: Home / Self Care | Payer: Medicare Other | Attending: Internal Medicine | Admitting: Internal Medicine

## 2021-09-15 ENCOUNTER — Ambulatory Visit (HOSPITAL_COMMUNITY): Payer: Medicare Other | Admitting: Anesthesiology

## 2021-09-15 ENCOUNTER — Ambulatory Visit (HOSPITAL_BASED_OUTPATIENT_CLINIC_OR_DEPARTMENT_OTHER): Payer: Medicare Other | Admitting: Anesthesiology

## 2021-09-15 ENCOUNTER — Encounter (HOSPITAL_COMMUNITY): Payer: Self-pay | Admitting: Internal Medicine

## 2021-09-15 ENCOUNTER — Encounter (HOSPITAL_COMMUNITY)
Admission: RE | Disposition: A | Discharge status: Home / Self Care | Payer: Self-pay | Source: Home / Self Care | Attending: Internal Medicine

## 2021-09-15 DIAGNOSIS — Z860100 Personal history of colon polyps, unspecified: Secondary | ICD-10-CM

## 2021-09-15 DIAGNOSIS — Z8711 Personal history of peptic ulcer disease: Secondary | ICD-10-CM | POA: Diagnosis not present

## 2021-09-15 DIAGNOSIS — Z79899 Other long term (current) drug therapy: Secondary | ICD-10-CM | POA: Insufficient documentation

## 2021-09-15 DIAGNOSIS — E039 Hypothyroidism, unspecified: Secondary | ICD-10-CM | POA: Insufficient documentation

## 2021-09-15 DIAGNOSIS — K644 Residual hemorrhoidal skin tags: Secondary | ICD-10-CM | POA: Diagnosis not present

## 2021-09-15 DIAGNOSIS — F419 Anxiety disorder, unspecified: Secondary | ICD-10-CM | POA: Diagnosis not present

## 2021-09-15 DIAGNOSIS — Z1211 Encounter for screening for malignant neoplasm of colon: Secondary | ICD-10-CM | POA: Insufficient documentation

## 2021-09-15 DIAGNOSIS — K219 Gastro-esophageal reflux disease without esophagitis: Secondary | ICD-10-CM | POA: Insufficient documentation

## 2021-09-15 DIAGNOSIS — Z8249 Family history of ischemic heart disease and other diseases of the circulatory system: Secondary | ICD-10-CM | POA: Diagnosis not present

## 2021-09-15 DIAGNOSIS — I10 Essential (primary) hypertension: Secondary | ICD-10-CM | POA: Diagnosis not present

## 2021-09-15 DIAGNOSIS — Z09 Encounter for follow-up examination after completed treatment for conditions other than malignant neoplasm: Secondary | ICD-10-CM | POA: Diagnosis not present

## 2021-09-15 DIAGNOSIS — Z800 Family history of malignant neoplasm of digestive organs: Secondary | ICD-10-CM | POA: Insufficient documentation

## 2021-09-15 HISTORY — PX: COLONOSCOPY WITH PROPOFOL: SHX5780

## 2021-09-15 LAB — HM COLONOSCOPY

## 2021-09-15 SURGERY — COLONOSCOPY WITH PROPOFOL
Anesthesia: General

## 2021-09-15 MED ORDER — PROPOFOL 500 MG/50ML IV EMUL
INTRAVENOUS | Status: DC | PRN
  Administered 2021-09-15: 100 ug/kg/min via INTRAVENOUS

## 2021-09-15 MED ORDER — PROPOFOL 10 MG/ML IV BOLUS
INTRAVENOUS | Status: DC | PRN
  Administered 2021-09-15: 30 mg via INTRAVENOUS
  Administered 2021-09-15: 100 mg via INTRAVENOUS

## 2021-09-15 MED ORDER — LACTATED RINGERS IV SOLN: INTRAVENOUS | Status: DC

## 2021-09-15 MED ORDER — GLYCOPYRROLATE 0.2 MG/ML IJ SOLN
INTRAMUSCULAR | Status: DC | PRN
  Administered 2021-09-15: .2 mg via INTRAVENOUS

## 2021-09-15 NOTE — Transfer of Care (Signed)
Immediate Anesthesia Transfer of Care Note  Patient: Debra Sanchez  Procedure(s) Performed: COLONOSCOPY WITH PROPOFOL  Patient Location: Short Stay  Anesthesia Type:General  Level of Consciousness: awake and patient cooperative  Airway & Oxygen Therapy: Patient Spontanous Breathing  Post-op Assessment: Report given to RN and Post -op Vital signs reviewed and stable  Post vital signs: Reviewed and stable  Last Vitals:  Vitals Value Taken Time  BP 106/72 09/15/21 1322  Temp 36.3 C 09/15/21 1322  Pulse 98 09/15/21 1322  Resp 14 09/15/21 1322  SpO2 97 % 09/15/21 1322    Last Pain:  Vitals:   09/15/21 1322  TempSrc: Oral  PainSc: 0-No pain         Complications: No notable events documented.

## 2021-09-15 NOTE — Anesthesia Procedure Notes (Signed)
Date/Time: 09/15/2021 12:49 PM Performed by: Vista Deck, CRNA Pre-anesthesia Checklist: Patient identified, Emergency Drugs available, Suction available, Timeout performed and Patient being monitored Patient Re-evaluated:Patient Re-evaluated prior to induction Oxygen Delivery Method: Nasal Cannula

## 2021-09-15 NOTE — Anesthesia Preprocedure Evaluation (Signed)
Anesthesia Evaluation  Patient identified by MRN, date of birth, ID band Patient awake    Reviewed: Allergy & Precautions, H&P , NPO status , Patient's Chart, lab work & pertinent test results, reviewed documented beta blocker date and time   Airway Mallampati: II  TM Distance: >3 FB Neck ROM: full    Dental no notable dental hx.    Pulmonary neg pulmonary ROS,    Pulmonary exam normal breath sounds clear to auscultation       Cardiovascular Exercise Tolerance: Good hypertension, negative cardio ROS   Rhythm:regular Rate:Normal     Neuro/Psych  Headaches, PSYCHIATRIC DISORDERS Anxiety  Neuromuscular disease    GI/Hepatic Neg liver ROS, hiatal hernia, PUD, GERD  Medicated,  Endo/Other  Hypothyroidism   Renal/GU negative Renal ROS  negative genitourinary   Musculoskeletal   Abdominal   Peds  Hematology  (+) Blood dyscrasia, anemia ,   Anesthesia Other Findings   Reproductive/Obstetrics negative OB ROS                             Anesthesia Physical Anesthesia Plan  ASA: 3  Anesthesia Plan: General   Post-op Pain Management:    Induction:   PONV Risk Score and Plan: Propofol infusion  Airway Management Planned:   Additional Equipment:   Intra-op Plan:   Post-operative Plan:   Informed Consent: I have reviewed the patients History and Physical, chart, labs and discussed the procedure including the risks, benefits and alternatives for the proposed anesthesia with the patient or authorized representative who has indicated his/her understanding and acceptance.     Dental Advisory Given  Plan Discussed with: CRNA  Anesthesia Plan Comments:         Anesthesia Quick Evaluation

## 2021-09-15 NOTE — Op Note (Signed)
[facility] Patient Name: Debra Sanchez Procedure Date: 09/15/2021 11:59 AM MRN: [id number] Date of Birth: 1940-06-21 Attending MD: Hildred Laser , MD CSN: [id number] Age: 81 Admit Type: Outpatient Procedure:                Colonoscopy Indications:              High risk colon cancer surveillance: Personal                            history of colonic polyps Providers:                Hildred Laser, MD, Crystal Page, Rosina Lowenstein, RN Referring MD:             Ma Hillock, DO Medicines:                Propofol per Anesthesia Complications:            No immediate complications. Estimated Blood Loss:     Estimated blood loss: none. Procedure:                Pre-Anesthesia Assessment:                           - Prior to the procedure, a History and Physical                            was performed, and patient medications and                            allergies were reviewed. The patient's tolerance of                            previous anesthesia was also reviewed. The risks                            and benefits of the procedure and the sedation                            options and risks were discussed with the patient.                            All questions were answered, and informed consent                            was obtained. Prior Anticoagulants: The patient has                            taken no previous anticoagulant or antiplatelet                            agents. ASA Grade Assessment: II - A patient with                            mild systemic disease. After reviewing the risks  and benefits, the patient was deemed in                            satisfactory condition to undergo the procedure.                           After obtaining informed consent, the colonoscope                            was passed under direct vision. Throughout the                            procedure, the patient's blood pressure, pulse, and                             oxygen saturations were monitored continuously. The                            [id number]-[id number] ([id number]) scope was introduced through                            the anus and advanced to the the cecum, identified                            by appendiceal orifice and ileocecal valve. The                            colonoscopy was somewhat difficult due to                            significant looping. Successful completion of the                            procedure was aided by using manual pressure and                            scope guide. The patient tolerated the procedure                            well. The quality of the bowel preparation was                            adequate. The ileocecal valve, appendiceal orifice,                            and rectum were photographed. Scope In: 12:53:37 PM Scope Out: 1:16:58 PM Scope Withdrawal Time: 0 hours 6 minutes 42 seconds  Total Procedure Duration: 0 hours 23 minutes 21 seconds  Findings:      The perianal and digital rectal examinations were normal.      The colon (entire examined portion) appeared normal.      External hemorrhoids were found during retroflexion. The hemorrhoids       were medium-sized. Impression:               -  The entire examined colon is normal.                           - External hemorrhoids.                           - No specimens collected. Moderate Sedation:      Per Anesthesia Care Recommendation:           - Patient has a contact number available for                            emergencies. The signs and symptoms of potential                            delayed complications were discussed with the                            patient. Return to normal activities tomorrow.                            Written discharge instructions were provided to the                            patient.                           - Resume previous diet today.                           - Continue present  medications.                           - No repeat colonoscopy due to age and the absence                            of advanced adenomas. Procedure Code(s):        --- Professional ---                           [phone], Colonoscopy, flexible; diagnostic, including                            collection of specimen(s) by brushing or washing,                            when performed (separate procedure) Diagnosis Code(s):        --- Professional ---                           Z86.010, Personal history of colonic polyps                           K64.4, Residual hemorrhoidal skin tags CPT copyright 2019 American [employer] Association. All rights reserved. The codes documented in this report are preliminary and upon coder review may  be revised to meet current compliance requirements. Hildred Laser, MD  Hildred Laser, MD 09/15/2021 1:24:12 PM This report has been signed electronically. Number of Addenda: 0

## 2021-09-15 NOTE — Progress Notes (Signed)
No EKG needed per Dr Briant Cedar.

## 2021-09-15 NOTE — H&P (Signed)
Debra Sanchez is an 81 y.o. female.   Chief Complaint: Patient is here for colonoscopy. HPI: Patient is 81 year old Caucasian female who is here for surveillance colonoscopy.  She had colonoscopy in 2012 with removal of 6 mm tubular adenoma.  She did not respond to a N20 2017.  She she just lost her sister recently because of advanced colon carcinoma diagnosed at 71.  Her mother also had colon carcinoma in her 4s.  Patient denies rectal bleeding change in her bowel habits or abdominal pain. Patient does not take aspirin or anticoagulants.  Past Medical History:  Diagnosis Date   Anemia    Anxiety    Arthritis    Blood transfusion 2012   Colon polyp    COVID-19 03/2019   DDD (degenerative disc disease), thoracolumbar    Degenerative disc disease, lumbar    Family history of adverse reaction to anesthesia    pts sister got very sick / burnt esophagus    Gastric ulcer    GERD (gastroesophageal reflux disease)    Heart murmur    History of hiatal hernia    Hypertension    Hypothyroidism    Migraines    Miscarriage    Osteoporosis    Pneumonia yrs ago   Syncope 2012   Wears glasses     Past Surgical History:  Procedure Laterality Date   ABDOMINAL HYSTERECTOMY     ANTERIOR CERVICAL DECOMP/DISCECTOMY FUSION     C5-6 and C6-7 anterior cervical diskectomy and fusion    BILATERAL OOPHORECTOMY     CATARACT EXTRACTION W/PHACO Left 10/20/2014   Procedure: CATARACT EXTRACTION PHACO AND INTRAOCULAR LENS PLACEMENT LEFT EYE;  Surgeon: Tonny Branch, MD;  Location: [facility];  Service: Ophthalmology;  Laterality: Left;  CDE:6.98   CATARACT EXTRACTION W/PHACO Right 11/17/2014   Procedure: CATARACT EXTRACTION PHACO AND INTRAOCULAR LENS PLACEMENT RIGHT EYE CDE=6.57;  Surgeon: Tonny Branch, MD;  Location: [facility];  Service: Ophthalmology;  Laterality: Right;   COLONOSCOPY  01/19/2011   Procedure: COLONOSCOPY;  Surgeon: Rogene Houston, MD;  Location: [facility];  Service: Endoscopy;  Laterality:  N/A;  2:00   DILATION AND CURETTAGE OF UTERUS     ESOPHAGOGASTRODUODENOSCOPY  11/05/2010   Procedure: ESOPHAGOGASTRODUODENOSCOPY (EGD);  Surgeon: Rogene Houston, MD;  Location: [facility];  Service: Endoscopy;  Laterality: N/A;  7:00 am per Melanie   ESOPHAGOGASTRODUODENOSCOPY N/A 07/25/2012   Procedure: ESOPHAGOGASTRODUODENOSCOPY (EGD);  Surgeon: Rogene Houston, MD;  Location: [facility];  Service: Endoscopy;  Laterality: N/A;  400-moved to 19 Ann notified pt   ESOPHAGOGASTRODUODENOSCOPY N/A 05/27/2015   Procedure: ESOPHAGOGASTRODUODENOSCOPY (EGD);  Surgeon: Rogene Houston, MD;  Location: [facility];  Service: Endoscopy;  Laterality: N/A;  2:00   HIATAL HERNIA REPAIR N/A 08/25/2015   Procedure: LAPAROSCOPIC REPAIR OF LARGE TYPE III  HIATAL HERNIA;  Surgeon: Johnathan Hausen, MD;  Location: [facility];  Service: General;  Laterality: N/A;   LAPAROSCOPIC NISSEN FUNDOPLICATION N/A 04/11/5724   Procedure: ENDOSCOPY, HIATAL HERNIA REPAIR, AND TAKE DOWN OF NISSEN FUNDOPILICATION;  Surgeon: Johnathan Hausen, MD;  Location: [facility];  Service: General;  Laterality: N/A;   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 She has never been exposed to tobacco smoke. She has never used smokeless tobacco. She reports that she does not drink alcohol and does not use drugs.  Allergies:  Allergies  Allergen Reactions   Hydromorphone Itching, Nausea Only and Other (See Comments)    After IV dilaudid pt flushed and felt dizzy   Ciprofloxacin Other (See Comments)    Stomach problems    Codeine Itching   Latex Other (See Comments)    fainted   Morphine And Related Itching   Nsaids Nausea And Vomiting   Penicillins Itching and Nausea And Vomiting    Has patient had a PCN reaction causing immediate rash, facial/tongue/throat swelling, SOB or lightheadedness with hypotension: No Has patient had a PCN reaction causing severe rash involving mucus membranes or skin necrosis: No Has patient had a PCN reaction that required hospitalization No Has patient had a PCN reaction occurring within the last 10 years: Yes If all of the above answers are "NO", then may proceed with Cephalosporin use.    Sulfa Antibiotics Nausea And Vomiting    Medications Prior to Admission  Medication Sig Dispense Refill   amLODipine (NORVASC) 2.5 MG tablet TAKE 1 TABLET BY MOUTH TWICE  DAILY 180 tablet 1   atorvastatin (LIPITOR) 20 MG tablet Take 1 tablet (20 mg total) by mouth daily. 90 tablet 3   donepezil (ARICEPT ODT) 5 MG disintegrating tablet Take 1 tablet (5 mg total) by mouth at bedtime. 90 tablet 1   Doxylamine Succinate, Sleep, (SLEEP AID PO) Take 1 tablet by mouth at bedtime.     esomeprazole (NEXIUM) 40 MG capsule Take 1 capsule (40 mg total) by mouth daily before breakfast. 90 capsule 3   famotidine (PEPCID) 40 MG tablet TAKE 1 TABLET BY MOUTH AT  BEDTIME 90 tablet 3   levothyroxine (SYNTHROID) 50 MCG tablet Take 1 tablet (50 mcg total) by mouth daily. 90 tablet 3   losartan (COZAAR) 100 MG tablet Take 1 tablet (100 mg total) by mouth daily. 90 tablet 1   MELATONIN PO Take 1 tablet by mouth at bedtime.     Multiple Vitamins-Minerals (MULTIVITAMIN GUMMIES WOMENS PO) Take 2 tablets by mouth daily.     Na Sulfate-K Sulfate-Mg Sulf (SUPREP BOWEL PREP KIT) 17.5-3.13-1.6 GM/177ML SOLN Take 1 kit by mouth as directed. 354 mL 0   OVER THE COUNTER MEDICATION Apply 1 application. topically daily. Hemp Cream     sertraline (ZOLOFT) 100 MG tablet Take 1 tablet (100 mg total) by mouth at bedtime. 90  tablet 1   acetaminophen (TYLENOL) 325 MG tablet Take 325 mg by mouth every 6 (six) hours as needed for mild pain (For pain.).       No results found for this or any previous visit (from the past 48 hour(s)). No results found.  Review of Systems  Blood pressure (!) 149/73, pulse 77, temperature 97.9 F (36.6 C), temperature source Oral, resp. rate 13, SpO2 98 %. Physical Exam HENT:     Mouth/Throat:     Mouth: Mucous membranes are moist.     Pharynx: Oropharynx is clear.  Eyes:     General: No scleral icterus.    Conjunctiva/sclera: Conjunctivae normal.  Cardiovascular:  Rate and Rhythm: Normal rate and regular rhythm.     Heart sounds: Normal heart sounds. No murmur heard. Pulmonary:     Effort: Pulmonary effort is normal.     Breath sounds: Normal breath sounds.  Abdominal:     General: There is no distension.     Palpations: Abdomen is soft. There is no mass.     Tenderness: There is no abdominal tenderness.  Musculoskeletal:        General: No swelling.     Cervical back: Neck supple.  Lymphadenopathy:     Cervical: No cervical adenopathy.  Skin:    General: Skin is warm and dry.  Neurological:     Mental Status: She is alert.     Assessment/Plan  History of colonic adenoma Family history of colon carcinoma in first and second-degree relative(late onset) Surveillance colonoscopy.  Hildred Laser, MD 09/15/2021, 12:41 PM

## 2021-09-15 NOTE — Discharge Instructions (Addendum)
Resume usual medications and diet as before. No driving for 24 hours. No more colonoscopies unless you have symptoms.

## 2021-09-16 NOTE — Anesthesia Postprocedure Evaluation (Signed)
Anesthesia Post Note  Patient: Debra Sanchez  Procedure(s) Performed: COLONOSCOPY WITH PROPOFOL  Patient location during evaluation: Phase II Anesthesia Type: General Level of consciousness: awake Pain management: pain level controlled Vital Signs Assessment: post-procedure vital signs reviewed and stable Respiratory status: spontaneous breathing and respiratory function stable Cardiovascular status: blood pressure returned to baseline and stable Postop Assessment: no headache and no apparent nausea or vomiting Anesthetic complications: no Comments: Late entry   No notable events documented.   Last Vitals:  Vitals:   09/15/21 1322 09/15/21 1329  BP: 106/72 139/67  Pulse: 98 87  Resp: 14 14  Temp: (!) 36.3 C   SpO2: 97% 98%    Last Pain:  Vitals:   09/15/21 1322  TempSrc: Oral  PainSc: 0-No pain                 [name]

## 2021-09-22 ENCOUNTER — Encounter (HOSPITAL_COMMUNITY): Payer: Self-pay | Admitting: Internal Medicine

## 2021-09-22 ENCOUNTER — Other Ambulatory Visit: Payer: Self-pay | Admitting: Family Medicine

## 2021-09-22 DIAGNOSIS — F419 Anxiety disorder, unspecified: Secondary | ICD-10-CM

## 2021-09-23 ENCOUNTER — Other Ambulatory Visit: Payer: Self-pay | Admitting: Family Medicine

## 2021-09-23 DIAGNOSIS — F419 Anxiety disorder, unspecified: Secondary | ICD-10-CM

## 2021-09-25 ENCOUNTER — Other Ambulatory Visit: Payer: Self-pay | Admitting: Family Medicine

## 2021-09-27 DIAGNOSIS — R051 Acute cough: Secondary | ICD-10-CM | POA: Diagnosis not present

## 2021-09-27 DIAGNOSIS — J0100 Acute maxillary sinusitis, unspecified: Secondary | ICD-10-CM | POA: Diagnosis not present

## 2021-10-11 ENCOUNTER — Ambulatory Visit: Payer: Medicare Other | Admitting: Family Medicine

## 2021-10-19 ENCOUNTER — Encounter: Payer: Self-pay | Admitting: Family Medicine

## 2021-10-19 ENCOUNTER — Ambulatory Visit (INDEPENDENT_AMBULATORY_CARE_PROVIDER_SITE_OTHER): Payer: Medicare Other | Admitting: Family Medicine

## 2021-10-19 ENCOUNTER — Ambulatory Visit (HOSPITAL_BASED_OUTPATIENT_CLINIC_OR_DEPARTMENT_OTHER)
Admission: RE | Admit: 2021-10-19 | Discharge: 2021-10-19 | Disposition: A | Discharge status: Home / Self Care | Payer: Medicare Other | Source: Ambulatory Visit | Attending: Family Medicine | Admitting: Family Medicine

## 2021-10-19 VITALS — BP 102/65 | HR 77 | Temp 98.2°F | Ht 68.0 in | Wt 192.0 lb

## 2021-10-19 DIAGNOSIS — R0609 Other forms of dyspnea: Secondary | ICD-10-CM

## 2021-10-19 DIAGNOSIS — F419 Anxiety disorder, unspecified: Secondary | ICD-10-CM | POA: Diagnosis not present

## 2021-10-19 DIAGNOSIS — E034 Atrophy of thyroid (acquired): Secondary | ICD-10-CM | POA: Diagnosis not present

## 2021-10-19 DIAGNOSIS — D329 Benign neoplasm of meninges, unspecified: Secondary | ICD-10-CM

## 2021-10-19 DIAGNOSIS — F5101 Primary insomnia: Secondary | ICD-10-CM

## 2021-10-19 DIAGNOSIS — E782 Mixed hyperlipidemia: Secondary | ICD-10-CM

## 2021-10-19 DIAGNOSIS — I10 Essential (primary) hypertension: Secondary | ICD-10-CM | POA: Diagnosis not present

## 2021-10-19 DIAGNOSIS — R413 Other amnesia: Secondary | ICD-10-CM

## 2021-10-19 DIAGNOSIS — J90 Pleural effusion, not elsewhere classified: Secondary | ICD-10-CM | POA: Diagnosis not present

## 2021-10-19 MED ORDER — AMLODIPINE BESYLATE 2.5 MG PO TABS: 2.5000 mg | ORAL_TABLET | Freq: Two times a day (BID) | ORAL | 1 refills | Status: DC

## 2021-10-19 MED ORDER — ATORVASTATIN CALCIUM 20 MG PO TABS: 20.0000 mg | ORAL_TABLET | Freq: Every day | ORAL | 3 refills | Status: DC

## 2021-10-19 MED ORDER — DONEPEZIL HCL 5 MG PO TBDP: 5.0000 mg | ORAL_TABLET | Freq: Every day | ORAL | 1 refills | Status: DC

## 2021-10-19 MED ORDER — LOSARTAN POTASSIUM 100 MG PO TABS: 100.0000 mg | ORAL_TABLET | Freq: Every day | ORAL | 1 refills | Status: DC

## 2021-10-19 MED ORDER — SERTRALINE HCL 100 MG PO TABS: 100.0000 mg | ORAL_TABLET | Freq: Every day | ORAL | 1 refills | Status: DC

## 2021-10-19 MED ORDER — FAMOTIDINE 40 MG PO TABS: 40.0000 mg | ORAL_TABLET | Freq: Every day | ORAL | 3 refills | Status: DC

## 2021-10-19 MED ORDER — ESOMEPRAZOLE MAGNESIUM 40 MG PO CPDR
40.0000 mg | DELAYED_RELEASE_CAPSULE | Freq: Every day | ORAL | 3 refills | Status: DC
Start: 2021-10-19 — End: 2022-04-27

## 2021-10-19 NOTE — Progress Notes (Unsigned)
Patient ID: Debra Sanchez, female  DOB: [birth year], 81 y.o.   MRN: [id number] Patient Care Team    Relationship Specialty Notifications Start End  Ma Hillock, DO PCP - General Family Medicine  04/20/15   Elease Hashimoto  Neurology  11/27/20   Chesley Mires, MD Consulting Physician Pulmonary Disease  04/26/21     Chief Complaint  Patient presents with   Hypertension    [place]; pt is not fasting    Subjective: Debra Sanchez is a 81 y.o.  Female  present for [facility] All past medical history, surgical history, allergies, family history, immunizations, medications and social history were updated in the electronic medical record today. All recent labs, ED visits and hospitalizations within the last year were reviewed.  Essential hypertension Pt reports compliance  with Cozaar 100 mg and amlodipine 2.5 g twice a day (her choice) Blood pressures ranges at home are reported WNL. Patient denies chest pain, shortness of breath, dizziness or lower extremity edema.   Pt does not take a daily baby ASA. She is tolerating start of a statin.  Diet: Watches her diet closely Exercise: Exercises routinely RF: Hypertension, obesity, family history heart disease    Anxiety/Insomnia, unspecified type: Patient reports compliance with Zoloft 100 mg daily. She reports she is ***   Hypothyroidism:  Pt reports compliance   with levo 50 mcg qd on an empty stomach. Pt has been referred to neuro for new meningioma and sleep study, d/t memory changes. She is still having memory changes. She has not had her sleep study yet *** Prior note. She has noticed over the last month she has had memory changes and she is concerned she is getting dementia. She takes a vit d and daily vitamin. She does wake herself on few times a night gasping for breath. She does snore. Body mass index is 29.44 kg/m. She also had covid 6 months ago.      04/26/2021   10:43 AM 03/18/2021   11:41 AM 03/18/2020   10:04 AM  09/16/2019    9:40 AM 03/18/2019    9:50 AM  Depression screen PHQ 2/9  Decreased Interest 0 0 0 0 0  Down, Depressed, Hopeless 0 0 0 0 0  PHQ - 2 Score 0 0 0 0 0      04/26/2021   10:43 AM 09/16/2019    9:39 AM 03/18/2019    9:49 AM  GAD 7 : Generalized Anxiety Score  Nervous, Anxious, on Edge 0 0 0  Control/stop worrying 0 0 0  Worry too much - different things 0 0 0  Trouble relaxing 0 0 0  Restless 0 0 0  Easily annoyed or irritable 0 0 0  Afraid - awful might happen 0 0 0  Total GAD 7 Score 0 0 0  Anxiety Difficulty Not difficult at all Not difficult at all Not difficult at all    Immunization History  Administered Date(s) Administered   Fluad Quad(high Dose 65+) 03/18/2020   Influenza Split 03/08/2020   Influenza, High Dose Seasonal PF 01/16/2017, 01/29/2018, 02/08/2019   Influenza,inj,Quad PF,6+ Mos 01/29/2018   Influenza-Unspecified 02/10/2015, 03/18/2020, 01/09/2021   Pneumococcal Conjugate-13 12/23/2013, 03/18/2019   Pneumococcal Polysaccharide-23 01/29/2018   Tdap 04/12/2006, 11/26/2016   Zoster Recombinat (Shingrix) 02/08/2019     Past Medical History:  Diagnosis Date   Anemia    Anxiety    Arthritis    Blood transfusion 2012   Colon polyp  COVID-19 03/2019   DDD (degenerative disc disease), thoracolumbar    Degenerative disc disease, lumbar    Family history of adverse reaction to anesthesia    pts sister got very sick / burnt esophagus    Gastric ulcer    GERD (gastroesophageal reflux disease)    Heart murmur    History of hiatal hernia    Hypertension    Hypothyroidism    Migraines    Miscarriage    Osteoporosis    Pneumonia yrs ago   Syncope 2012   Wears glasses    Allergies  Allergen Reactions   Hydromorphone Itching, Nausea Only and Other (See Comments)    After IV dilaudid pt flushed and felt dizzy   Ciprofloxacin Other (See Comments)    Stomach problems   Codeine Itching   Latex Other (See Comments)    fainted   Morphine And  Related Itching   Nsaids Nausea And Vomiting   Penicillins Itching and Nausea And Vomiting    Has patient had a PCN reaction causing immediate rash, facial/tongue/throat swelling, SOB or lightheadedness with hypotension: No Has patient had a PCN reaction causing severe rash involving mucus membranes or skin necrosis: No Has patient had a PCN reaction that required hospitalization No Has patient had a PCN reaction occurring within the last 10 years: Yes If all of the above answers are "NO", then may proceed with Cephalosporin use.    Sulfa Antibiotics Nausea And Vomiting   Past Surgical History:  Procedure Laterality Date   ABDOMINAL HYSTERECTOMY     ANTERIOR CERVICAL DECOMP/DISCECTOMY FUSION     C5-6 and C6-7 anterior cervical diskectomy and fusion    BILATERAL OOPHORECTOMY     CATARACT EXTRACTION W/PHACO Left 10/20/2014   Procedure: CATARACT EXTRACTION PHACO AND INTRAOCULAR LENS PLACEMENT LEFT EYE;  Surgeon: Tonny Branch, MD;  Location: [facility];  Service: Ophthalmology;  Laterality: Left;  CDE:6.98   CATARACT EXTRACTION W/PHACO Right 11/17/2014   Procedure: CATARACT EXTRACTION PHACO AND INTRAOCULAR LENS PLACEMENT RIGHT EYE CDE=6.57;  Surgeon: Tonny Branch, MD;  Location: [facility];  Service: Ophthalmology;  Laterality: Right;   COLONOSCOPY  01/19/2011   Procedure: COLONOSCOPY;  Surgeon: Rogene Houston, MD;  Location: [facility];  Service: Endoscopy;  Laterality: N/A;  2:00   COLONOSCOPY WITH PROPOFOL N/A 09/15/2021   Procedure: COLONOSCOPY WITH PROPOFOL;  Surgeon: Rogene Houston, MD;  Location: [facility];  Service: Endoscopy;  Laterality: N/A;  1230 ASA 2   DILATION AND CURETTAGE OF UTERUS     ESOPHAGOGASTRODUODENOSCOPY  11/05/2010   Procedure: ESOPHAGOGASTRODUODENOSCOPY (EGD);  Surgeon: Rogene Houston, MD;  [facility]: [facility];  Service: Endoscopy;  Laterality: N/A;  7:00 am per Melanie   ESOPHAGOGASTRODUODENOSCOPY N/A 07/25/2012   Procedure: ESOPHAGOGASTRODUODENOSCOPY (EGD);  Surgeon:  Rogene Houston, MD;  [facility]: [facility];  Service: Endoscopy;  Laterality: N/A;  400-moved to 12 Ann notified pt   ESOPHAGOGASTRODUODENOSCOPY N/A 05/27/2015   Procedure: ESOPHAGOGASTRODUODENOSCOPY (EGD);  Surgeon: Rogene Houston, MD;  [facility]: [facility];  Service: Endoscopy;  Laterality: N/A;  2:00   HIATAL HERNIA REPAIR N/A 08/25/2015   Procedure: LAPAROSCOPIC REPAIR OF LARGE TYPE III  HIATAL HERNIA;  Surgeon: Johnathan Hausen, MD;  Location: [facility];  Service: General;  Laterality: N/A;   LAPAROSCOPIC NISSEN FUNDOPLICATION N/A 4/0/0867   Procedure: ENDOSCOPY, HIATAL HERNIA REPAIR, AND TAKE DOWN OF NISSEN FUNDOPILICATION;  Surgeon: Johnathan Hausen, MD;  Location: [facility];  Service: General;  Laterality: N/A;   THORACIC  SPINE 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   2 children Kerin Ransom and Anne Ng)    Retired, 2 yrs college.    Caffeine beverages.   Takes a daily vitamin.   Wears her seatbelt. Smoke detector in the home.    Feels safe in her relationships.       Right handed              Allergies as of 10/19/2021       Reactions   Hydromorphone Itching, Nausea Only, Other (See Comments)   After IV dilaudid pt flushed and felt dizzy   Ciprofloxacin Other (See Comments)   Stomach problems   Codeine Itching   Latex Other (See Comments)   fainted   Morphine And Related Itching   Nsaids Nausea And Vomiting   Penicillins Itching, Nausea And Vomiting   Has patient had a PCN reaction causing immediate rash, facial/tongue/throat swelling,  SOB or lightheadedness with hypotension: No Has patient had a PCN reaction causing severe rash involving mucus membranes or skin necrosis: No Has patient had a PCN reaction that required hospitalization No Has patient had a PCN reaction occurring within the last 10 years: Yes If all of the above answers are "NO", then may proceed with Cephalosporin use.   Sulfa Antibiotics Nausea And Vomiting        Medication List        Accurate as of October 19, 2021 10:33 AM. If you have any questions, ask your nurse or doctor.          acetaminophen 325 MG tablet Commonly known as: TYLENOL Take 325 mg by mouth every 6 (six) hours as needed for mild pain (For pain.).   amLODipine 2.5 MG tablet Commonly known as: NORVASC TAKE 1 TABLET BY MOUTH TWICE  DAILY   atorvastatin 20 MG tablet Commonly known as: LIPITOR Take 1 tablet (20 mg total) by mouth daily.   donepezil 5 MG disintegrating tablet Commonly known as: ARICEPT ODT Take 1 tablet (5 mg total) by mouth at bedtime.   esomeprazole 40 MG capsule Commonly known as: NEXIUM Take 1 capsule (40 mg total) by mouth daily before breakfast.   famotidine 40 MG tablet Commonly known as: PEPCID TAKE 1 TABLET BY MOUTH AT  BEDTIME   levothyroxine 50 MCG tablet Commonly known as: SYNTHROID Take 1 tablet (50 mcg total) by mouth daily.   losartan 100 MG tablet Commonly known as: COZAAR Take 1 tablet (100 mg total) by mouth daily.   MELATONIN PO Take 1 tablet by mouth at bedtime.   MULTIVITAMIN GUMMIES WOMENS PO Take 2 tablets by mouth daily.   OVER THE COUNTER MEDICATION Apply 1 application. topically daily. Hemp Cream   sertraline 100 MG tablet Commonly known as: ZOLOFT Take 1 tablet (100 mg total) by mouth at bedtime.   SLEEP AID PO Take 1 tablet by mouth at  bedtime.        All past medical history, surgical history, allergies, family history, immunizations andmedications were updated in the EMR today and reviewed under the  history and medication portions of their EMR.       No results found.   ROS 14 pt review of systems performed and negative (unless mentioned in an HPI)  Objective: BP 102/65   Pulse 77   Temp 98.2 F (36.8 C) (Oral)   Ht '5\' 8"'$  (1.727 m)   Wt 192 lb (87.1 kg)   SpO2 95%   BMI 29.19 kg/m  Physical Exam Vitals and nursing note reviewed.  Constitutional:      General: She is not in acute distress.    Appearance: Normal appearance. She is not ill-appearing, toxic-appearing or diaphoretic.  HENT:     Head: Normocephalic and atraumatic.     Mouth/Throat:     Mouth: Mucous membranes are moist.  Eyes:     General: No scleral icterus.       Right eye: No discharge.        Left eye: No discharge.     Extraocular Movements: Extraocular movements intact.     Conjunctiva/sclera: Conjunctivae normal.     Pupils: Pupils are equal, round, and reactive to light.  Cardiovascular:     Rate and Rhythm: Normal rate and regular rhythm.     Heart sounds: No murmur heard. Pulmonary:     Effort: Pulmonary effort is normal. No respiratory distress.     Breath sounds: Normal breath sounds. No wheezing, rhonchi or rales.  Musculoskeletal:     Cervical back: Neck supple. No tenderness.     Right lower leg: No edema.     Left lower leg: No edema.  Lymphadenopathy:     Cervical: No cervical adenopathy.  Skin:    General: Skin is warm and dry.     Coloration: Skin is not jaundiced or pale.     Findings: No erythema or rash.  Neurological:     Mental Status: She is alert and oriented to person, place, and time. Mental status is at baseline.     Motor: No weakness.     Gait: Gait normal.  Psychiatric:        Mood and Affect: Mood normal.        Behavior: Behavior normal.        Thought Content: Thought content normal.        Judgment: Judgment normal.      No results found.  Assessment/plan: NADRA HRITZ is a 81 y.o. female present for [facility] Anxiety/insomnia ***zoloft 100 mg  QD   Essential hypertension/HLD/on statin therapy/overweight *** Losartan 100 mg Qd  continue amlodipine 2.5 BID Continue liptor 20 mg qd Discussed weight loss- dietary modifications and exercise.    Hypothyroidism, unspecified type  - *** - continue levothyroxine 50 mcg daily > she is having some memory issues.    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  - now est with neuro and pulm.  - encouraged her to have the sleep study completed with Dr. Halford Chessman. *** - start*** aricpet 5 mg qhs.   Pulmonary nodules Low risk of ca, pul nodules have been stable. No further image needed.    Return in about 6 months (around 04/27/2022) for cpe (40 min), Routine chronic condition follow-up.   No orders of the defined types were placed in this encounter.  No orders of the defined types were placed in this encounter.  Referral Orders  No referral(s) requested today     Electronically signed by: Howard Pouch, [place]

## 2021-10-19 NOTE — Patient Instructions (Addendum)
Return in about 6 months (around 04/27/2022) for cpe (40 min), Routine chronic condition follow-up.        Great to see you today.  I have refilled the medication(s) we provide.   If labs were collected, we will inform you of lab results once received either by echart message or telephone call.   - echart message- for normal results that have been seen by the patient already.   - telephone call: abnormal results or if patient has not viewed results in their echart.   [place], [place], [place] [address]

## 2021-10-21 ENCOUNTER — Telehealth: Payer: Self-pay | Admitting: Family Medicine

## 2021-10-21 MED ORDER — DONEPEZIL HCL 10 MG PO TBDP
10.0000 mg | ORAL_TABLET | Freq: Every day | ORAL | 1 refills | Status: DC
Start: 2021-10-21 — End: 2022-04-27

## 2021-10-21 NOTE — Telephone Encounter (Signed)
Please call patient: Chest x-ray did not show areas of concern of fluid or pneumonia. It did show that she has a hiatal hernia present, which may cause some of her symptoms if it is enlarging (again).  This would be something she would need to discuss further with her gastroenterology team.  X-ray does not show indication/need for any additional medications at this time.

## 2021-10-21 NOTE — Telephone Encounter (Signed)
Spoke with patient regarding results/recommendations.  

## 2021-10-25 ENCOUNTER — Ambulatory Visit: Payer: Medicare Other | Admitting: Family Medicine

## 2021-10-27 NOTE — Progress Notes (Signed)
This encounter was created in error - please disregard. Pt husband stated she was out of town and may not be able to be contacted left VM on cell phone will need to be rescheduled

## 2021-11-08 ENCOUNTER — Ambulatory Visit: Payer: Medicare Other | Admitting: Physician Assistant

## 2021-12-09 DIAGNOSIS — J0100 Acute maxillary sinusitis, unspecified: Secondary | ICD-10-CM | POA: Diagnosis not present

## 2021-12-21 ENCOUNTER — Telehealth: Payer: Self-pay | Admitting: Family Medicine

## 2021-12-21 NOTE — Telephone Encounter (Signed)
Pt scheduled medicare well  for 9/14

## 2021-12-23 ENCOUNTER — Ambulatory Visit (INDEPENDENT_AMBULATORY_CARE_PROVIDER_SITE_OTHER): Payer: Medicare Other

## 2021-12-23 DIAGNOSIS — Z0000 Encounter for general adult medical examination without abnormal findings: Secondary | ICD-10-CM | POA: Diagnosis not present

## 2021-12-23 NOTE — Progress Notes (Signed)
Subjective:   Debra Sanchez is a 81 y.o. female who presents for an Initial Medicare Annual Wellness Visit.  I connected with  Debra Sanchez on 12/23/21 by an audio only telemedicine application and verified that I am speaking with the correct person using two identifiers.   I discussed the limitations, risks, security and privacy concerns of performing an evaluation and management service by telephone and the availability of in person appointments. I also discussed with the patient that there may be a patient responsible charge related to this service. The patient expressed understanding and verbally consented to this telephonic visit.  Location of Patient: home Location of Provider: office  List any persons and their role that are participating in the visit with the patient.   [place], CMA  Review of Systems    Defer to PCP Cardiac Risk Factors include: advanced age (>26mn, >>75women)     Objective:    There were no vitals filed for this visit. There is no height or weight on file to calculate BMI.     12/23/2021   11:08 AM 09/15/2021   10:58 AM 09/13/2021   12:35 PM 01/21/2021   11:05 AM 11/27/2020    1:01 PM 11/02/2020    4:25 PM 09/27/2018   11:42 AM  Advanced Directives  Does Patient Have a Medical Advance Directive? Yes No No Yes Yes No Yes  Type of AParamedicof AFarmerLiving will   [place] will Healthcare Power of [place] Does patient want to make changes to medical advance directive? No - Patient declined   No - Patient declined   No - Patient declined  Copy of [place] Chart? No - copy requested   No - copy requested   No - copy requested  Would patient like information on creating a medical advance directive?  No - Patient declined No - Patient declined   Yes (ED - Information included in AVS)     Current Medications  (verified) Outpatient Encounter Medications as of 12/23/2021  Medication Sig   acetaminophen (TYLENOL) 325 MG tablet Take 325 mg by mouth every 6 (six) hours as needed for mild pain (For pain.).    amLODipine (NORVASC) 2.5 MG tablet Take 1 tablet (2.5 mg total) by mouth 2 (two) times daily.   atorvastatin (LIPITOR) 20 MG tablet Take 1 tablet (20 mg total) by mouth daily.   donepezil (ARICEPT ODT) 10 MG disintegrating tablet Take 1 tablet (10 mg total) by mouth at bedtime.   Doxylamine Succinate, Sleep, (SLEEP AID PO) Take 1 tablet by mouth at bedtime.   esomeprazole (NEXIUM) 40 MG capsule Take 1 capsule (40 mg total) by mouth daily before breakfast.   famotidine (PEPCID) 40 MG tablet Take 1 tablet (40 mg total) by mouth at bedtime.   levothyroxine (SYNTHROID) 50 MCG tablet Take 1 tablet (50 mcg total) by mouth daily.   losartan (COZAAR) 100 MG tablet Take 1 tablet (100 mg total) by mouth daily.   MELATONIN PO Take 1 tablet by mouth at bedtime.   Multiple Vitamins-Minerals (MULTIVITAMIN GUMMIES WOMENS PO) Take 2 tablets by mouth daily.   OVER THE COUNTER MEDICATION Apply 1 application. topically daily. Hemp Cream   sertraline (ZOLOFT) 100 MG tablet Take 1 tablet (100 mg total) by mouth at bedtime.   No facility-administered encounter medications on file as of 12/23/2021.    Allergies (verified) Hydromorphone, Ciprofloxacin,  Codeine, Latex, Morphine and related, Nsaids, Penicillins, and Sulfa antibiotics   History: Past Medical History:  Diagnosis Date   Anemia    Anxiety    Arthritis    Blood transfusion 2012   Colon polyp    COVID-19 03/2019   DDD (degenerative disc disease), thoracolumbar    Degenerative disc disease, lumbar    Family history of adverse reaction to anesthesia    pts sister got very sick / burnt esophagus    Gastric ulcer    GERD (gastroesophageal reflux disease)    Heart murmur    History of hiatal hernia    Hypertension    Hypothyroidism    Migraines     Miscarriage    Osteoporosis    Pneumonia yrs ago   Syncope 2012   Wears glasses    Past Surgical History:  Procedure Laterality Date   ABDOMINAL HYSTERECTOMY     ANTERIOR CERVICAL DECOMP/DISCECTOMY FUSION     C5-6 and C6-7 anterior cervical diskectomy and fusion    BILATERAL OOPHORECTOMY     CATARACT EXTRACTION W/PHACO Left 10/20/2014   Procedure: CATARACT EXTRACTION PHACO AND INTRAOCULAR LENS PLACEMENT LEFT EYE;  Surgeon: Tonny Branch, MD;  Location: [facility];  Service: Ophthalmology;  Laterality: Left;  CDE:6.98   CATARACT EXTRACTION W/PHACO Right 11/17/2014   Procedure: CATARACT EXTRACTION PHACO AND INTRAOCULAR LENS PLACEMENT RIGHT EYE CDE=6.57;  Surgeon: Tonny Branch, MD;  Location: [facility];  Service: Ophthalmology;  Laterality: Right;   COLONOSCOPY  01/19/2011   Procedure: COLONOSCOPY;  Surgeon: Rogene Houston, MD;  [facility];  Service: Endoscopy;  Laterality: N/A;  2:00   COLONOSCOPY WITH PROPOFOL N/A 09/15/2021   Procedure: COLONOSCOPY WITH PROPOFOL;  Surgeon: Rogene Houston, MD;  [facility];  Service: Endoscopy;  Laterality: N/A;  1230 ASA 2   DILATION AND CURETTAGE OF UTERUS     ESOPHAGOGASTRODUODENOSCOPY  11/05/2010   Procedure: ESOPHAGOGASTRODUODENOSCOPY (EGD);  Surgeon: Rogene Houston, MD;  [facility];  Service: Endoscopy;  Laterality: N/A;  7:00 am per Melanie   ESOPHAGOGASTRODUODENOSCOPY N/A 07/25/2012   Procedure: ESOPHAGOGASTRODUODENOSCOPY (EGD);  Surgeon: Rogene Houston, MD;  [facility];  Service: Endoscopy;  Laterality: N/A;  400-moved to 55 Ann notified pt   ESOPHAGOGASTRODUODENOSCOPY N/A 05/27/2015   Procedure: ESOPHAGOGASTRODUODENOSCOPY (EGD);  Surgeon: Rogene Houston, MD;  [facility];  Service: Endoscopy;  Laterality: N/A;  2:00   HIATAL HERNIA REPAIR N/A 08/25/2015   Procedure: LAPAROSCOPIC REPAIR OF LARGE TYPE III  HIATAL HERNIA;  Surgeon: Johnathan Hausen, MD;  Location: [facility];  Service: General;  Laterality:  N/A;   LAPAROSCOPIC NISSEN FUNDOPLICATION N/A 05/15/4008   Procedure: ENDOSCOPY, HIATAL HERNIA REPAIR, AND TAKE DOWN OF NISSEN FUNDOPILICATION;  Surgeon: Johnathan Hausen, MD;  Location: [facility];  Service: General;  Laterality: N/A;   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     Family History  Problem Relation Age of Onset   Colon cancer Mother    Heart disease Mother    Arthritis Mother    Heart disease Father    Arthritis Father    Arthritis Sister    Arthritis Brother    Lung cancer Brother        smoker   Arthritis Maternal Aunt    Arthritis Maternal Uncle    Diabetes Maternal Uncle    Social History   Socioeconomic History   Marital  status: Married    Spouse name: Not on file   Number of children: Not on file   Years of education: Not on file   Highest education level: Not on file  Occupational History   Not on file  Tobacco Use   Smoking status: Never    Passive exposure: Never   Smokeless tobacco: Never  Vaping Use   Vaping Use: Never used  Substance and Sexual Activity   Alcohol use: No    Alcohol/week: 0.0 standard drinks of alcohol   Drug use: No   Sexual activity: Never    Birth control/protection: None, Surgical  Other Topics Concern   Not on file  Social History Narrative   Married to [employer].   2 children Kerin Ransom and Anne Ng)    Retired, 2 yrs college.    Caffeine beverages.   Takes a daily vitamin.   Wears her seatbelt. Smoke detector in the home.    Feels safe in her relationships.       Right handed             Social Determinants of Health   Financial Resource Strain: Low Risk  (12/23/2021)   Overall Financial Resource Strain (CARDIA)    Difficulty of Paying Living Expenses: Not hard at all  Food Insecurity: No Food Insecurity (12/23/2021)   Hunger Vital Sign    Worried About Running Out of Food in the Last Year: Never true    Ran Out of Food in the Last Year: Never true   Transportation Needs: No Transportation Needs (12/23/2021)   PRAPARE - Hydrologist (Medical): No    Lack of Transportation (Non-Medical): No  Physical Activity: Insufficiently Active (12/23/2021)   Exercise Vital Sign    Days of Exercise per Week: 7 days    Minutes of Exercise per Session: 20 min  Stress: No Stress Concern Present (12/23/2021)   [place]    Feeling of Stress : Not at all  Social Connections: [place] (12/23/2021)   Social Connection and Isolation Panel [NHANES]    Frequency of Communication with Friends and Family: More than three times a week    Frequency of Social Gatherings with Friends and Family: Three times a week    Attends Religious Services: More than 4 times per year    Active Member of Clubs or Organizations: Yes    Attends Music therapist: More than 4 times per year    Marital Status: Married    Tobacco Counseling Counseling given: Not Answered   Clinical Intake:  Pre-visit preparation completed: Yes  Pain : No/denies pain     Nutritional Status: BMI 25 -29 Overweight Nutritional Risks: None Diabetes: No  How often do you need to have someone help you when you read instructions, pamphlets, or other written materials from your doctor or pharmacy?: 1 - Never  Diabetic?no  Interpreter Needed?: No      Activities of Daily Living    12/23/2021   11:07 AM 09/13/2021   12:37 PM  In your present state of health, do you have any difficulty performing the following activities:  Hearing? 0   Vision? 0   Difficulty concentrating or making decisions? 0   Walking or climbing stairs? 0   Dressing or bathing? 0   Doing errands, shopping? 0 0  Preparing Food and eating ? N   Using the Toilet? N   In the past six months,  have you accidently leaked urine? N   Do you have problems with loss of bowel control? N   Managing your  Medications? N   Managing your Finances? N   Housekeeping or managing your Housekeeping? N     Patient Care Team: Ma Hillock, DO as PCP - General (Family Medicine) Elease Hashimoto (Neurology) Chesley Mires, MD as Consulting Physician (Pulmonary Disease)  Indicate any recent Medical Services you may have received from other than [facility] providers in the past year (date may be approximate).     Assessment:   This is a routine wellness examination for Debra Sanchez.  Hearing/Vision screen No results found.  Dietary issues and exercise activities discussed: Current Exercise Habits: Home exercise routine, Time (Minutes): 20, Frequency (Times/Week): 7, Weekly Exercise (Minutes/Week): 140, Intensity: Mild   Goals Addressed   None   Depression Screen    12/23/2021   11:02 AM 10/19/2021   10:33 AM 04/26/2021   10:43 AM 03/18/2021   11:41 AM 03/18/2020   10:04 AM 09/16/2019    9:40 AM 03/18/2019    9:50 AM  PHQ 2/9 Scores  PHQ - 2 Score 0 2 0 0 0 0 0  PHQ- 9 Score  8         Fall Risk    12/23/2021   11:06 AM 04/26/2021   10:43 AM 03/18/2021   11:41 AM 11/27/2020    1:01 PM 03/18/2020   10:04 AM  Fall Risk   Falls in the past year? 0 0 0 0 0  Number falls in past yr: 0 0 0 0 0  Injury with Fall? 0 0 0 0 0  Risk for fall due to : No Fall Risks  No Fall Risks;Impaired balance/gait    Follow up Falls evaluation completed  Falls evaluation completed  Falls evaluation completed    FALL RISK PREVENTION PERTAINING TO THE HOME:  Any stairs in or around the home? Yes  If so, are there any without handrails? Yes  Home free of loose throw rugs in walkways, pet beds, electrical cords, etc? Yes  Adequate lighting in your home to reduce risk of falls? Yes   ASSISTIVE DEVICES UTILIZED TO PREVENT FALLS:  Life alert? No  Use of a cane, walker or w/c? No  Grab bars in the bathroom? Yes  Shower chair or bench in shower? No  Elevated toilet seat or a handicapped toilet? No   TIMED UP AND  GO:  Was the test performed? No .  Length of time to ambulate 10 feet: n/a sec.    Cognitive Function:      11/27/2020    1:00 PM  Montreal Cognitive Assessment   Visuospatial/ Executive (0/5) 4  Naming (0/3) 2  Attention: Read list of digits (0/2) 2  Attention: Read list of letters (0/1) 1  Attention: Serial 7 subtraction starting at 100 (0/3) 2  Language: Repeat phrase (0/2) 1  Language : Fluency (0/1) 1  Abstraction (0/2) 2  Delayed Recall (0/5) 4  Orientation (0/6) 6  Total 25  Adjusted Score (based on education) 25      12/23/2021   11:00 AM  6CIT Screen  What Year? 0 points  What month? 0 points  What time? 0 points  Count back from 20 0 points  Months in reverse 0 points  Repeat phrase 0 points  Total Score 0 points    Immunizations Immunization History  Administered Date(s) Administered   Fluad Quad(high Dose 65+) 03/18/2020  Influenza Split 03/08/2020   Influenza, High Dose Seasonal PF 01/16/2017, 01/29/2018, 02/08/2019   Influenza,inj,Quad PF,6+ Mos 01/29/2018   Influenza-Unspecified 02/10/2015, 03/18/2020, 01/09/2021   Pneumococcal Conjugate-13 12/23/2013, 03/18/2019   Pneumococcal Polysaccharide-23 01/29/2018   Tdap 04/12/2006, 11/26/2016   Zoster Recombinat (Shingrix) 02/08/2019    TDAP status: Up to date  Flu Vaccine status: Due, Education has been provided regarding the importance of this vaccine. Advised may receive this vaccine at local pharmacy or Health Dept. Aware to provide a copy of the vaccination record if obtained from local pharmacy or Health Dept. Verbalized acceptance and understanding.  Pneumococcal vaccine status: Up to date  Covid-19 vaccine status: Declined, Education has been provided regarding the importance of this vaccine but patient still declined. Advised may receive this vaccine at local pharmacy or Health Dept.or vaccine clinic. Aware to provide a copy of the vaccination record if obtained from local pharmacy or Health  Dept. Verbalized acceptance and understanding.  Qualifies for Shingles Vaccine? Yes   Zostavax completed No   Shingrix Completed?: No.    Education has been provided regarding the importance of this vaccine. Patient has been advised to call insurance company to determine out of pocket expense if they have not yet received this vaccine. Advised may also receive vaccine at local pharmacy or Health Dept. Verbalized acceptance and understanding.  Screening Tests Health Maintenance  Topic Date Due   Zoster Vaccines- Shingrix (2 of 2) 04/05/2019   INFLUENZA VACCINE  11/09/2021   TETANUS/TDAP  11/27/2026   Pneumonia Vaccine 66+ Years old  Completed   DEXA SCAN  Completed   HPV VACCINES  Aged Out   COVID-19 Vaccine  Discontinued    Health Maintenance  Health Maintenance Due  Topic Date Due   Zoster Vaccines- Shingrix (2 of 2) 04/05/2019   INFLUENZA VACCINE  11/09/2021    Colorectal cancer screening: No longer required.   Mammogram status: No longer required due to age.  Bone Density status: Completed 09/13/21. Results reflect: Bone density results: OSTEOPENIA. Repeat every 2 years.  Lung Cancer Screening: (Low Dose CT Chest recommended if Age 10-80 years, 30 pack-year currently smoking OR have quit w/in 15years.) does not qualify.   Lung Cancer Screening Referral: n/a  Additional Screening:  Hepatitis C Screening: does not qualify; Completed n/a  Vision Screening: Recommended annual ophthalmology exams for early detection of glaucoma and other disorders of the eye. Is the patient up to date with their annual eye exam?  Yes  Who is the provider or what is the name of the office in which the patient attends annual eye exams? [facility]  If pt is not established with a provider, would they like to be referred to a provider to establish care? No .   Dental Screening: Recommended annual dental exams for proper oral hygiene  Community Resource Referral / Chronic Care  Management: CRR required this visit?  No   CCM required this visit?  No      Plan:     I have personally reviewed and noted the following in the patient's chart:   Medical and social history Use of alcohol, tobacco or illicit drugs  Current medications and supplements including opioid prescriptions. Patient is not currently taking opioid prescriptions. Functional ability and status Nutritional status Physical activity Advanced directives List of other physicians Hospitalizations, surgeries, and ER visits in previous 12 months Vitals Screenings to include cognitive, depression, and falls Referrals and appointments  In addition, I have reviewed and discussed with patient  certain preventive protocols, quality metrics, and best practice recommendations. A written personalized care plan for preventive services as well as general preventive health recommendations were provided to patient.     Beatrix Fetters, [place]   12/23/2021   Nurse Notes: Non-Face to Face or Face to Face 11 minute visit Encounter    Ms. Burdick , Thank you for taking time to come for your Medicare Wellness Visit. I appreciate your ongoing commitment to your health goals. Please review the following plan we discussed and let me know if I can assist you in the future.   These are the goals we discussed:  Goals   None     This is a list of the screening recommended for you and due dates:  Health Maintenance  Topic Date Due   Zoster (Shingles) Vaccine (2 of 2) 04/05/2019   Flu Shot  11/09/2021   Tetanus Vaccine  11/27/2026   Pneumonia Vaccine  Completed   DEXA scan (bone density measurement)  Completed   HPV Vaccine  Aged Out   COVID-19 Vaccine  Discontinued

## 2021-12-23 NOTE — Patient Instructions (Signed)

## 2021-12-27 ENCOUNTER — Other Ambulatory Visit: Payer: Self-pay | Admitting: Family Medicine

## 2021-12-27 DIAGNOSIS — I10 Essential (primary) hypertension: Secondary | ICD-10-CM

## 2021-12-29 ENCOUNTER — Other Ambulatory Visit: Payer: Self-pay | Admitting: Family Medicine

## 2022-01-21 ENCOUNTER — Encounter: Payer: Self-pay | Admitting: Family Medicine

## 2022-01-21 ENCOUNTER — Ambulatory Visit (HOSPITAL_BASED_OUTPATIENT_CLINIC_OR_DEPARTMENT_OTHER)
Admission: RE | Admit: 2022-01-21 | Discharge: 2022-01-21 | Disposition: A | Discharge status: Home / Self Care | Payer: Medicare Other | Source: Ambulatory Visit | Attending: Family Medicine | Admitting: Family Medicine

## 2022-01-21 ENCOUNTER — Ambulatory Visit (INDEPENDENT_AMBULATORY_CARE_PROVIDER_SITE_OTHER): Payer: Medicare Other | Admitting: Family Medicine

## 2022-01-21 VITALS — BP 149/87 | HR 70 | Temp 97.9°F | Wt 194.0 lb

## 2022-01-21 DIAGNOSIS — D329 Benign neoplasm of meninges, unspecified: Secondary | ICD-10-CM

## 2022-01-21 DIAGNOSIS — J3489 Other specified disorders of nose and nasal sinuses: Secondary | ICD-10-CM | POA: Diagnosis not present

## 2022-01-21 DIAGNOSIS — J0100 Acute maxillary sinusitis, unspecified: Secondary | ICD-10-CM | POA: Diagnosis not present

## 2022-01-21 DIAGNOSIS — Z23 Encounter for immunization: Secondary | ICD-10-CM | POA: Diagnosis not present

## 2022-01-21 MED ORDER — IPRATROPIUM BROMIDE 0.06 % NA SOLN
2.0000 | Freq: Four times a day (QID) | NASAL | 12 refills | Status: DC
Start: 2022-01-21 — End: 2022-07-19

## 2022-01-21 NOTE — Patient Instructions (Signed)
Sinus Pain  Sinus pain happens when your sinuses get swollen or blocked (clogged). Sinuses are spaces behind the bones of your face and forehead. You may feel pain or pressure in your face, forehead, ears, or upper teeth. Sinus pain can be mild or very bad. What are the causes? Sinus pain can result from conditions that affect your sinuses. Common causes include: Colds. Sinus infections. Allergies. What are the signs or symptoms? The main symptom of this condition is pain or pressure in your face, forehead, ears, or upper teeth. People who have sinus pain often have other symptoms, such as: Stuffed up or runny nose. Fever. Not being able to smell. Headache. Weather changes can make your symptoms worse. How is this treated? Treatment for this condition depends on the cause. Sinus pain caused by: A sinus infection may be treated with antibiotic medicine. A stuffy nose may be helped by rinsing out the nose and sinuses with a salt water solution. Allergies may be helped by allergy medicines and nasal sprays. Surgery may be needed in some cases if other treatments do not help. Follow these instructions at home: General instructions If told: Apply a warm, moist washcloth to your face. This can help to lessen pain. Use a nasal salt water wash. Follow the directions on the bottle or box. Hydrate and humidify Drink enough water to keep your pee (urine) pale yellow. Use a humidifier if your home is dry. Breathe in steam for 10-15 minutes, 3-4 times a day or as told by your doctor. You can do this in the bathroom while a hot shower is running. Try not to spend time in cool or dry air. Medicines  Take over-the-counter and prescription medicines only as told by your doctor. If you were prescribed an antibiotic medicine, take it as told by your doctor. Do not stop taking it even if you start to feel better. Use a nose spray if your nose feels full of mucus (congested). Contact a doctor if: You  get more than one headache a week. Light or sound bothers you. You have a fever. You feel sick to your stomach (nauseous) or you vomit. Your headaches do not get better with treatment. Get help right away if: You have trouble seeing. You suddenly have very bad pain in your face or head. You start to have quick, sudden movements or shaking that you cannot control (seizure). You are confused. You have a stiff neck. Summary Sinus pain happens when your sinuses get swollen or blocked (clogged). Sinuses are spaces behind the bones of your face and forehead. You may feel pain or pressure in your face, forehead, ears, or upper teeth. Take over-the-counter and prescription medicines only as told by your doctor. If told, apply a warm, moist washcloth to your face. This can help to lessen pain. This information is not intended to replace advice given to you by your health care provider. Make sure you discuss any questions you have with your health care provider. Document Revised: 02/28/2021 Document Reviewed: 02/28/2021 Elsevier Patient Education  [place].

## 2022-01-21 NOTE — Progress Notes (Signed)
Debra Sanchez , Nov 16, 1940, 81 y.o., female MRN: [id number] Patient Care Team    Relationship Specialty Notifications Start End  Ma Hillock, DO PCP - General Family Medicine  04/20/15   Elease Hashimoto  Neurology  11/27/20   Chesley Mires, MD Consulting Physician Pulmonary Disease  04/26/21     Chief Complaint  Patient presents with   nasal drainage    Thinks it has something to do with tumor. Tenderness on right side/2 months Started after bad sinus infection     Subjective: Pt presents for an OV with complaints of right sided facial pressure she describes as sinus cavities feel "raw "which she presumes is from nasal drainage.  She reports the discomfort is along the right side of her forehead, behind her eye right maxillary sinus and right ear fullness She states she has noticed frequent nasal drainage that is clear and has been present since she had a bad sinus infection a couple months ago.  She was treated at an urgent care with clarithromycin and a steroid. She is not currently taking a 24-hour allergy regimen.  She is taking Benadryl nightly. Has an 8 mm right meningioma.  Has had some memory changes and is prescribed Aricept 10 mg nightly.  She is asking for an increase in her Aricept today.     12/23/2021   11:02 AM 10/19/2021   10:33 AM 04/26/2021   10:43 AM 03/18/2021   11:41 AM 03/18/2020   10:04 AM  Depression screen PHQ 2/9  Decreased Interest 0 1 0 0 0  Down, Depressed, Hopeless 0 1 0 0 0  PHQ - 2 Score 0 2 0 0 0  Altered sleeping  3     Tired, decreased energy  1     Change in appetite  2     Feeling bad or failure about yourself   0     Trouble concentrating  0     Moving slowly or fidgety/restless  0     Suicidal thoughts  0     PHQ-9 Score  8       Allergies  Allergen Reactions   Hydromorphone Itching, Nausea Only and Other (See Comments)    After IV dilaudid pt flushed and felt dizzy   Ciprofloxacin Other (See Comments)    Stomach  problems   Codeine Itching   Latex Other (See Comments)    fainted   Morphine And Related Itching   Nsaids Nausea And Vomiting   Penicillins Itching and Nausea And Vomiting    Has patient had a PCN reaction causing immediate rash, facial/tongue/throat swelling, SOB or lightheadedness with hypotension: No Has patient had a PCN reaction causing severe rash involving mucus membranes or skin necrosis: No Has patient had a PCN reaction that required hospitalization No Has patient had a PCN reaction occurring within the last 10 years: Yes If all of the above answers are "NO", then may proceed with Cephalosporin use.    Sulfa Antibiotics Nausea And Vomiting   Social History   Social History Narrative   Married to [employer].   2 children Kerin Ransom and Anne Ng)    Retired, 2 yrs college.    Caffeine beverages.   Takes a daily vitamin.   Wears her seatbelt. Smoke detector in the home.    Feels safe in her relationships.       Right handed             Past  Medical History:  Diagnosis Date   Anemia    Anxiety    Arthritis    Blood transfusion 2012   Colon polyp    COVID-19 03/2019   DDD (degenerative disc disease), thoracolumbar    Degenerative disc disease, lumbar    Family history of adverse reaction to anesthesia    pts sister got very sick / burnt esophagus    Gastric ulcer    GERD (gastroesophageal reflux disease)    Heart murmur    History of hiatal hernia    Hypertension    Hypothyroidism    Migraines    Miscarriage    Osteoporosis    Pneumonia yrs ago   Syncope 2012   Wears glasses    Past Surgical History:  Procedure Laterality Date   ABDOMINAL HYSTERECTOMY     ANTERIOR CERVICAL DECOMP/DISCECTOMY FUSION     C5-6 and C6-7 anterior cervical diskectomy and fusion    BILATERAL OOPHORECTOMY     CATARACT EXTRACTION W/PHACO Left 10/20/2014   Procedure: CATARACT EXTRACTION PHACO AND INTRAOCULAR LENS PLACEMENT LEFT EYE;  Surgeon: Tonny Branch, MD;  Location: [facility];   Service: Ophthalmology;  Laterality: Left;  CDE:6.98   CATARACT EXTRACTION W/PHACO Right 11/17/2014   Procedure: CATARACT EXTRACTION PHACO AND INTRAOCULAR LENS PLACEMENT RIGHT EYE CDE=6.57;  Surgeon: Tonny Branch, MD;  Location: [facility];  Service: Ophthalmology;  Laterality: Right;   COLONOSCOPY  01/19/2011   Procedure: COLONOSCOPY;  Surgeon: Rogene Houston, MD;  [facility];  Service: Endoscopy;  Laterality: N/A;  2:00   COLONOSCOPY WITH PROPOFOL N/A 09/15/2021   Procedure: COLONOSCOPY WITH PROPOFOL;  Surgeon: Rogene Houston, MD;  [facility];  Service: Endoscopy;  Laterality: N/A;  1230 ASA 2   DILATION AND CURETTAGE OF UTERUS     ESOPHAGOGASTRODUODENOSCOPY  11/05/2010   Procedure: ESOPHAGOGASTRODUODENOSCOPY (EGD);  Surgeon: Rogene Houston, MD;  [facility];  Service: Endoscopy;  Laterality: N/A;  7:00 am per Melanie   ESOPHAGOGASTRODUODENOSCOPY N/A 07/25/2012   Procedure: ESOPHAGOGASTRODUODENOSCOPY (EGD);  Surgeon: Rogene Houston, MD;  [facility];  Service: Endoscopy;  Laterality: N/A;  400-moved to 58 Ann notified pt   ESOPHAGOGASTRODUODENOSCOPY N/A 05/27/2015   Procedure: ESOPHAGOGASTRODUODENOSCOPY (EGD);  Surgeon: Rogene Houston, MD;  [facility];  Service: Endoscopy;  Laterality: N/A;  2:00   HIATAL HERNIA REPAIR N/A 08/25/2015   Procedure: LAPAROSCOPIC REPAIR OF LARGE TYPE III  HIATAL HERNIA;  Surgeon: Johnathan Hausen, MD;  Location: [facility];  Service: General;  Laterality: N/A;   LAPAROSCOPIC NISSEN FUNDOPLICATION N/A 10/15/8240   Procedure: ENDOSCOPY, HIATAL HERNIA REPAIR, AND TAKE DOWN OF NISSEN FUNDOPILICATION;  Surgeon: Johnathan Hausen, MD;  Location: [facility];  Service: General;  Laterality: N/A;   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     Family History  Problem Relation Age of Onset   Colon cancer Mother    Heart disease Mother    Arthritis Mother    Heart disease  Father    Arthritis Father    Arthritis Sister    Arthritis Brother    Lung cancer Brother        smoker   Arthritis Maternal Aunt    Arthritis Maternal Uncle    Diabetes Maternal Uncle    Allergies as of 01/21/2022       Reactions   Hydromorphone Itching, Nausea Only, Other (See Comments)   After  IV dilaudid pt flushed and felt dizzy   Ciprofloxacin Other (See Comments)   Stomach problems   Codeine Itching   Latex Other (See Comments)   fainted   Morphine And Related Itching   Nsaids Nausea And Vomiting   Penicillins Itching, Nausea And Vomiting   Has patient had a PCN reaction causing immediate rash, facial/tongue/throat swelling, SOB or lightheadedness with hypotension: No Has patient had a PCN reaction causing severe rash involving mucus membranes or skin necrosis: No Has patient had a PCN reaction that required hospitalization No Has patient had a PCN reaction occurring within the last 10 years: Yes If all of the above answers are "NO", then may proceed with Cephalosporin use.   Sulfa Antibiotics Nausea And Vomiting        Medication List        Accurate as of January 21, 2022 11:15 AM. If you have any questions, ask your nurse or doctor.          acetaminophen 325 MG tablet Commonly known as: TYLENOL Take 325 mg by mouth every 6 (six) hours as needed for mild pain (For pain.).   amLODipine 2.5 MG tablet Commonly known as: NORVASC Take 1 tablet (2.5 mg total) by mouth 2 (two) times daily.   atorvastatin 20 MG tablet Commonly known as: LIPITOR Take 1 tablet (20 mg total) by mouth daily.   donepezil 10 MG disintegrating tablet Commonly known as: ARICEPT ODT Take 1 tablet (10 mg total) by mouth at bedtime.   esomeprazole 40 MG capsule Commonly known as: NEXIUM Take 1 capsule (40 mg total) by mouth daily before breakfast.   famotidine 40 MG tablet Commonly known as: PEPCID Take 1 tablet (40 mg total) by mouth at bedtime.   levothyroxine 50 MCG  tablet Commonly known as: SYNTHROID Take 1 tablet (50 mcg total) by mouth daily.   losartan 100 MG tablet Commonly known as: COZAAR Take 1 tablet (100 mg total) by mouth daily.   MELATONIN PO Take 1 tablet by mouth at bedtime.   MULTIVITAMIN GUMMIES WOMENS PO Take 2 tablets by mouth daily.   OVER THE COUNTER MEDICATION Apply 1 application. topically daily. Hemp Cream   sertraline 100 MG tablet Commonly known as: ZOLOFT Take 1 tablet (100 mg total) by mouth at bedtime.   SLEEP AID PO Take 1 tablet by mouth at bedtime.        All past medical history, surgical history, allergies, family history, immunizations andmedications were updated in the EMR today and reviewed under the history and medication portions of their EMR.     Review of Systems  Constitutional:  Negative for chills, fever and malaise/fatigue.  HENT:  Positive for congestion and sinus pain. Negative for ear discharge, nosebleeds and sore throat.   Respiratory:  Negative for cough.   Neurological:  Negative for dizziness and headaches.   Negative, with the exception of above mentioned in HPI   Objective:  BP (!) 149/87   Pulse 70   Temp 97.9 F (36.6 C)   Wt 194 lb (88 kg)   SpO2 94%   BMI 29.50 kg/m  Body mass index is 29.5 kg/m. Physical Exam HENT:     Head: Normocephalic and atraumatic. No right periorbital erythema or left periorbital erythema.     Salivary Glands: Right salivary gland is not diffusely enlarged or tender. Left salivary gland is not diffusely enlarged or tender.     Right Ear: No middle ear effusion. No mastoid tenderness. Tympanic membrane  is not injected, perforated, erythematous, retracted or bulging.     Left Ear:  No middle ear effusion. No mastoid tenderness. Tympanic membrane is not injected, perforated, erythematous, retracted or bulging.     Nose: Septal deviation, congestion and rhinorrhea present.     Right Turbinates: Not swollen.     Left Turbinates: Not swollen.      Right Sinus: No maxillary sinus tenderness or frontal sinus tenderness.     Left Sinus: No maxillary sinus tenderness or frontal sinus tenderness.     Mouth/Throat:     Lips: No lesions.     Mouth: Mucous membranes are moist.     Tongue: No lesions.     Palate: No mass.      No results found. No results found. No results found for this or any previous visit (from the past 24 hour(s)).  Assessment/Plan: CAELEN HIGINBOTHAM is a 81 y.o. female present for OV for  Need for immunization against influenza - Flu Vaccine QUAD High Dose(Fluad)  Meningioma ([place]), 8 mm right temporal She canceled her follow-up with neurology.  She is concerned her symptoms today is from her meningioma. I encouraged her to reschedule her follow-up with neurology.  Majority of patients follow-up yearly for about 5 years, to ensure lesion is stable. She states she will call and get it rescheduled. Last MRI suggested an 8 mm homogenously enhancing extra-axial mass laterally over the right temporal convexity without significant mass effect or edema.  Would not expect a mass of this size to create any of her current symptoms, although cannot rule it out if the mass has grown without moving forward with MRI.  Subacute maxillary sinusitis Start Atrovent nasal spray 4 times daily as needed to help with reported nasal drainage Discontinue Benadryl use.  Patient understands this is on the beers list and can affect her memory. Start over-the-counter Allegra (plain) every day - DG Sinuses Complete; Future   -Discussed antibiotic/prednisone/ENT referral depending upon x-ray result.  Reviewed expectations re: course of current medical issues. Discussed self-management of symptoms. Outlined signs and symptoms indicating need for more acute intervention. Patient verbalized understanding and all questions were answered. Patient received an After-Visit Summary.    Orders Placed This Encounter  Procedures   Flu  Vaccine QUAD High Dose(Fluad)   No orders of the defined types were placed in this encounter.  Referral Orders  No referral(s) requested today     Note is dictated utilizing voice recognition software. Although note has been proof read prior to signing, occasional typographical errors still can be missed. If any questions arise, please do not hesitate to call for verification.   electronically signed by:  Howard Pouch, DO  [place]

## 2022-01-25 ENCOUNTER — Telehealth: Payer: Self-pay | Admitting: Family Medicine

## 2022-01-25 MED ORDER — PREDNISONE 20 MG PO TABS: 40.0000 mg | ORAL_TABLET | Freq: Every day | ORAL | 0 refills | Status: DC

## 2022-01-25 MED ORDER — CEFDINIR 300 MG PO CAPS: 300.0000 mg | ORAL_CAPSULE | Freq: Two times a day (BID) | ORAL | 0 refills | Status: DC

## 2022-01-25 NOTE — Telephone Encounter (Signed)
Please inform patient Debra Sanchez x-ray was normal.  There was mild signs of inflammation, therefore I do want to go ahead and proceed with treating with a round of antibiotic and prednisone.  I have called in [place] twice daily x10 days, she has been on this medication in the past and tolerated.  I also called in 5 days of prednisone. If symptoms do not resolve after completion of the above meds, then I would like to see Debra Sanchez back at that time and we will proceed with further evaluation and possible CT/MRI of sinus/head.

## 2022-01-25 NOTE — Telephone Encounter (Signed)
Spoke with patient regarding results/recommendations.  

## 2022-02-04 ENCOUNTER — Other Ambulatory Visit: Payer: Self-pay | Admitting: Family Medicine

## 2022-02-04 DIAGNOSIS — I10 Essential (primary) hypertension: Secondary | ICD-10-CM

## 2022-03-09 ENCOUNTER — Other Ambulatory Visit: Payer: Self-pay | Admitting: Family Medicine

## 2022-03-09 DIAGNOSIS — F419 Anxiety disorder, unspecified: Secondary | ICD-10-CM

## 2022-03-17 ENCOUNTER — Encounter: Payer: Self-pay | Admitting: Orthopaedic Surgery

## 2022-03-17 ENCOUNTER — Ambulatory Visit: Payer: Self-pay

## 2022-03-17 ENCOUNTER — Ambulatory Visit: Payer: Medicare Other | Admitting: Orthopaedic Surgery

## 2022-03-17 ENCOUNTER — Ambulatory Visit (INDEPENDENT_AMBULATORY_CARE_PROVIDER_SITE_OTHER): Payer: Medicare Other

## 2022-03-17 VITALS — Ht 68.0 in | Wt 195.0 lb

## 2022-03-17 DIAGNOSIS — M25561 Pain in right knee: Secondary | ICD-10-CM | POA: Diagnosis not present

## 2022-03-17 DIAGNOSIS — M25562 Pain in left knee: Secondary | ICD-10-CM | POA: Diagnosis not present

## 2022-03-17 DIAGNOSIS — M170 Bilateral primary osteoarthritis of knee: Secondary | ICD-10-CM | POA: Insufficient documentation

## 2022-03-17 DIAGNOSIS — G8929 Other chronic pain: Secondary | ICD-10-CM

## 2022-03-17 MED ORDER — BUPIVACAINE HCL 0.25 % IJ SOLN
4.0000 mL | INTRAMUSCULAR | Status: AC | PRN
  Administered 2022-03-17: 4 mL via INTRA_ARTICULAR

## 2022-03-17 MED ORDER — LIDOCAINE HCL 1 % IJ SOLN
0.5000 mL | INTRAMUSCULAR | Status: AC | PRN
  Administered 2022-03-17: .5 mL

## 2022-03-17 MED ORDER — METHYLPREDNISOLONE ACETATE 40 MG/ML IJ SUSP
40.0000 mg | INTRAMUSCULAR | Status: AC | PRN
  Administered 2022-03-17: 40 mg via INTRA_ARTICULAR

## 2022-03-17 NOTE — Progress Notes (Signed)
3  Office Visit Note   Patient: Debra Sanchez           Date of Birth: 1940/07/31           MRN: [id number] Visit Date: 03/17/2022              Requested by: Ma Hillock, DO [id number] [place],  [place] [id number] PCP: Ma Hillock, DO   Assessment & Plan: Visit Diagnoses:  1. Chronic pain of both knees   2. Bilateral primary osteoarthritis of knee     Plan: Good relief post right knee injection.  She can return after the holidays if she like to have the opposite left knee injected.  X-ray results reviewed again with copy of the report.  Patient states she wants to avoid total knee arthroplasty if possible.  Follow-Up Instructions: No follow-ups on file.   Orders:  Orders Placed This Encounter  Procedures   XR KNEE 3 VIEW LEFT   XR KNEE 3 VIEW RIGHT   No orders of the defined types were placed in this encounter.     Procedures: Large Joint Inj: R knee on 03/17/2022 11:34 AM Indications: pain and joint swelling Details: 22 G 1.5 in needle, anterolateral approach  Arthrogram: No  Medications: 40 mg methylPREDNISolone acetate 40 MG/ML; 0.5 mL lidocaine 1 %; 4 mL bupivacaine 0.25 % Outcome: tolerated well, no immediate complications Procedure, treatment alternatives, risks and benefits explained, specific risks discussed. Consent was given by the patient. Immediately prior to procedure a time out was called to verify the correct patient, procedure, equipment, support staff and site/side marked as required. Patient was prepped and draped in the usual sterile fashion.       Clinical Data: No additional findings.   Subjective: Chief Complaint  Patient presents with   Right Knee - Pain   Left Knee - Pain    HPI 81 year old female with greater than 6-year history of bilateral progressive knee pain.  She has trouble going up and down steps or squatting.  Pain is worse in the right than left knee.  Sometimes her knee feels like it will give out she is use  Tylenol.  States she has a large hiatal hernia and cannot tolerate any anti-inflammatories.  She has not use the walker.  She notes swelling with increased walking.  She avoids stairs.  Review of Systems positive for right 8 mm temporal meningioma hypertension GERD, hiatal hernia.  Negative for diabetes negative for stroke or heart attack.   Objective: Vital Signs: Ht '5\' 8"'$  (1.727 m)   Wt 195 lb (88.5 kg)   BMI 29.65 kg/m   Physical Exam Constitutional:      Appearance: She is well-developed.  HENT:     Head: Normocephalic.     Right Ear: External ear normal.     Left Ear: External ear normal. There is no impacted cerumen.  Eyes:     Pupils: Pupils are equal, round, and reactive to light.  Neck:     Thyroid: No thyromegaly.     Trachea: No tracheal deviation.  Cardiovascular:     Rate and Rhythm: Normal rate.  Pulmonary:     Effort: Pulmonary effort is normal.  Abdominal:     Palpations: Abdomen is soft.  Musculoskeletal:     Cervical back: No rigidity.  Skin:    General: Skin is warm and dry.  Neurological:     Mental Status: She is alert and oriented to person, place, and  time.  Psychiatric:        Behavior: Behavior normal.     Ortho Exam bilateral knee crepitus.  She has slight hop step going up on a single step in the exam room.  Some lateral positioning of the patella.  Distal pulses intact negative logroll hips.  Specialty Comments:  No specialty comments available.  Imaging: No results found.   PMFS History: Patient Active Problem List   Diagnosis Date Noted   Bilateral primary osteoarthritis of knee 03/17/2022   Memory changes 10/19/2021   Meningioma ([place]), 8 mm right temporal 03/18/2021   Esophageal dysphagia 10/13/2020   Continuous leakage of urine 03/18/2020   Mixed hyperlipidemia 03/19/2019   GERD (gastroesophageal reflux disease) 12/18/2018   Anxiety 10/22/2018   Overweight (BMI 25.0-29.9) 10/22/2018   Chronic pain of both knees 01/29/2018    Persistent headaches 11/18/2016   Hiatal hernia with GERD 06/17/2016   Status post laparoscopic Nissen fundoplication 61/95/0932   Pulmonary nodules 05/12/2015   Insomnia 04/20/2015   Hypertension 05/15/2012   Hypothyroidism 05/15/2012   History of colonic polyps 05/15/2012   Anemia 05/15/2012   Heme positive stool 05/15/2012   Past Medical History:  Diagnosis Date   Anemia    Anxiety    Arthritis    Blood transfusion 2012   Colon polyp    COVID-19 03/2019   DDD (degenerative disc disease), thoracolumbar    Degenerative disc disease, lumbar    Family history of adverse reaction to anesthesia    pts sister got very sick / burnt esophagus    Gastric ulcer    GERD (gastroesophageal reflux disease)    Heart murmur    History of hiatal hernia    Hypertension    Hypothyroidism    Migraines    Miscarriage    Osteoporosis    Pneumonia yrs ago   Syncope 2012   Wears glasses     Family History  Problem Relation Age of Onset   Colon cancer Mother    Heart disease Mother    Arthritis Mother    Heart disease Father    Arthritis Father    Arthritis Sister    Arthritis Brother    Lung cancer Brother        smoker   Arthritis Maternal Aunt    Arthritis Maternal Uncle    Diabetes Maternal Uncle     Past Surgical History:  Procedure Laterality Date   ABDOMINAL HYSTERECTOMY     ANTERIOR CERVICAL DECOMP/DISCECTOMY FUSION     C5-6 and C6-7 anterior cervical diskectomy and fusion    BILATERAL OOPHORECTOMY     CATARACT EXTRACTION W/PHACO Left 10/20/2014   Procedure: CATARACT EXTRACTION PHACO AND INTRAOCULAR LENS PLACEMENT LEFT EYE;  Surgeon: Tonny Branch, MD;  Location: [facility];  Service: Ophthalmology;  Laterality: Left;  CDE:6.98   CATARACT EXTRACTION W/PHACO Right 11/17/2014   Procedure: CATARACT EXTRACTION PHACO AND INTRAOCULAR LENS PLACEMENT RIGHT EYE CDE=6.57;  Surgeon: Tonny Branch, MD;  [facility]: [facility];  Service: Ophthalmology;  Laterality: Right;   COLONOSCOPY  01/19/2011    Procedure: COLONOSCOPY;  Surgeon: Rogene Houston, MD;  [facility];  Service: Endoscopy;  Laterality: N/A;  2:00   COLONOSCOPY WITH PROPOFOL N/A 09/15/2021   Procedure: COLONOSCOPY WITH PROPOFOL;  Surgeon: Rogene Houston, MD;  [facility];  Service: Endoscopy;  Laterality: N/A;  1230 ASA 2   DILATION AND CURETTAGE OF UTERUS     ESOPHAGOGASTRODUODENOSCOPY  11/05/2010   Procedure: ESOPHAGOGASTRODUODENOSCOPY (EGD);  Surgeon: Rogene Houston, MD;  [facility];  Service: Endoscopy;  Laterality: N/A;  7:00 am per Melanie   ESOPHAGOGASTRODUODENOSCOPY N/A 07/25/2012   Procedure: ESOPHAGOGASTRODUODENOSCOPY (EGD);  Surgeon: Rogene Houston, MD;  [facility];  Service: Endoscopy;  Laterality: N/A;  400-moved to 68 Ann notified pt   ESOPHAGOGASTRODUODENOSCOPY N/A 05/27/2015   Procedure: ESOPHAGOGASTRODUODENOSCOPY (EGD);  Surgeon: Rogene Houston, MD;  [facility];  Service: Endoscopy;  Laterality: N/A;  2:00   HIATAL HERNIA REPAIR N/A 08/25/2015   Procedure: LAPAROSCOPIC REPAIR OF LARGE TYPE III  HIATAL HERNIA;  Surgeon: Johnathan Hausen, MD;  [facility];  Service: General;  Laterality: N/A;   LAPAROSCOPIC NISSEN FUNDOPLICATION N/A 08/14/8125   Procedure: ENDOSCOPY, HIATAL HERNIA REPAIR, AND TAKE DOWN OF NISSEN FUNDOPILICATION;  Surgeon: Johnathan Hausen, MD;  Location[facility];  Service: General;  Laterality: N/A;   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

## 2022-04-14 ENCOUNTER — Ambulatory Visit: Payer: Medicare Other | Admitting: Orthopaedic Surgery

## 2022-04-21 ENCOUNTER — Other Ambulatory Visit: Payer: Self-pay | Admitting: Family Medicine

## 2022-04-21 DIAGNOSIS — I10 Essential (primary) hypertension: Secondary | ICD-10-CM

## 2022-04-27 ENCOUNTER — Ambulatory Visit (INDEPENDENT_AMBULATORY_CARE_PROVIDER_SITE_OTHER): Payer: Medicare Other | Admitting: Family Medicine

## 2022-04-27 ENCOUNTER — Encounter: Payer: Medicare Other | Admitting: Family Medicine

## 2022-04-27 ENCOUNTER — Encounter: Payer: Self-pay | Admitting: Family Medicine

## 2022-04-27 VITALS — BP 108/62 | HR 69 | Temp 98.7°F | Ht 67.0 in | Wt 194.0 lb

## 2022-04-27 DIAGNOSIS — F419 Anxiety disorder, unspecified: Secondary | ICD-10-CM

## 2022-04-27 DIAGNOSIS — I10 Essential (primary) hypertension: Secondary | ICD-10-CM

## 2022-04-27 DIAGNOSIS — E782 Mixed hyperlipidemia: Secondary | ICD-10-CM

## 2022-04-27 DIAGNOSIS — D329 Benign neoplasm of meninges, unspecified: Secondary | ICD-10-CM

## 2022-04-27 DIAGNOSIS — R002 Palpitations: Secondary | ICD-10-CM | POA: Diagnosis not present

## 2022-04-27 DIAGNOSIS — Z79899 Other long term (current) drug therapy: Secondary | ICD-10-CM

## 2022-04-27 DIAGNOSIS — Z0000 Encounter for general adult medical examination without abnormal findings: Secondary | ICD-10-CM | POA: Diagnosis not present

## 2022-04-27 DIAGNOSIS — E034 Atrophy of thyroid (acquired): Secondary | ICD-10-CM

## 2022-04-27 MED ORDER — FAMOTIDINE 40 MG PO TABS: 40.0000 mg | ORAL_TABLET | Freq: Every day | ORAL | 3 refills | Status: DC

## 2022-04-27 MED ORDER — ESOMEPRAZOLE MAGNESIUM 40 MG PO CPDR: 40.0000 mg | DELAYED_RELEASE_CAPSULE | Freq: Every day | ORAL | 3 refills | Status: DC

## 2022-04-27 MED ORDER — DONEPEZIL HCL 10 MG PO TBDP: 10.0000 mg | ORAL_TABLET | Freq: Every day | ORAL | 1 refills | Status: DC

## 2022-04-27 MED ORDER — LOSARTAN POTASSIUM 100 MG PO TABS: 100.0000 mg | ORAL_TABLET | Freq: Every day | ORAL | 1 refills | Status: DC

## 2022-04-27 MED ORDER — SERTRALINE HCL 100 MG PO TABS: 100.0000 mg | ORAL_TABLET | Freq: Every day | ORAL | 1 refills | Status: DC

## 2022-04-27 MED ORDER — AMLODIPINE BESYLATE 2.5 MG PO TABS: 2.5000 mg | ORAL_TABLET | Freq: Two times a day (BID) | ORAL | 1 refills | Status: DC

## 2022-04-27 MED ORDER — ATORVASTATIN CALCIUM 20 MG PO TABS: 20.0000 mg | ORAL_TABLET | Freq: Every day | ORAL | 3 refills | Status: DC

## 2022-04-27 NOTE — Patient Instructions (Addendum)
Return in about 6 months (around 10/18/2022) for Routine chronic condition follow-up.        Great to see you today.  I have refilled the medication(s) we provide.   If labs were collected, we will inform you of lab results once received either by echart message or telephone call.   - echart message- for normal results that have been seen by the patient already.   - telephone call: abnormal results or if patient has not viewed results in their echart.

## 2022-04-27 NOTE — Progress Notes (Signed)
Patient ID: Debra Sanchez, female  DOB: [birth year], 82 y.o.   MRN: [id number] Patient Care Team    Relationship Specialty Notifications Start End  Ma Hillock, DO PCP - General Family Medicine  04/20/15   Elease Hashimoto  Neurology  11/27/20   Chesley Mires, MD Consulting Physician Pulmonary Disease  04/26/21     Chief Complaint  Patient presents with   Annual Exam    Pt is not fasting     Subjective: Debra Sanchez is a 82 y.o.  Female  present for annual physical and chronic medical condition follow-up combination appointment All past medical history, surgical history, allergies, family history, immunizations, medications and social history were updated in the electronic medical record today. All recent labs, ED visits and hospitalizations within the last year were reviewed.  Health maintenance:  Mammogram: completed:declines mammogram.  Immunizations: tdap UTD 2018, Influenza UTD 01/2022 (encouraged yearly), PNA series completed, Shingrix x1 only-declined DEXA: last completed 2023 result -1.2 Assistive device: none Oxygen BDZ:HGDJ Patient has a Dental home. Hospitalizations/ED visits: reviewed  Essential hypertension Pt reports compliance with Cozaar 100 mg and amlodipine 2.5 g twice a day (her choice) Blood pressures ranges at home are reported WNL.  Patient denies chest pain, lower extremity edema.  She has noticed feeling a little more short of breath and feeling winded, she is having new onset palpitations last 4 weeks. Frequently through t the day. .    Pt does not take a daily baby ASA. She is tolerating start of a statin.  Diet: Watches her diet closely Exercise: Exercises routinely RF: Hypertension, obesity, family history heart disease    Anxiety/Insomnia, unspecified type: Patient reports compliance with Zoloft 100 mg daily. She reports she is feeling well on this dose of Zoloft and would like to continue.   Hypothyroidism:  Pt reports  compliance with levo 50 mcg qd on an empty stomach. Pt has been referred to neuro for new meningioma and sleep study, d/t memory changes. She is still having memory changes.  Prior note. She has noticed over the last month she has had memory changes and she is concerned she is getting dementia. She takes a vit d and daily vitamin. She does wake herself on few times a night gasping for breath. She does snore. Body mass index is 29.44 kg/m. She also had covid 6 months ago.      04/27/2022    1:33 PM 12/23/2021   11:02 AM 10/19/2021   10:33 AM 04/26/2021   10:43 AM 03/18/2021   11:41 AM  Depression screen PHQ 2/9  Decreased Interest 0 0 1 0 0  Down, Depressed, Hopeless 0 0 1 0 0  PHQ - 2 Score 0 0 2 0 0  Altered sleeping   3    Tired, decreased energy   1    Change in appetite   2    Feeling bad or failure about yourself    0    Trouble concentrating   0    Moving slowly or fidgety/restless   0    Suicidal thoughts   0    PHQ-9 Score   8        10/19/2021   10:33 AM 04/26/2021   10:43 AM 09/16/2019    9:39 AM 03/18/2019    9:49 AM  GAD 7 : Generalized Anxiety Score  Nervous, Anxious, on Edge 2 0 0 0  Control/stop worrying 1 0 0 0  Worry  too much - different things 1 0 0 0  Trouble relaxing 1 0 0 0  Restless 0 0 0 0  Easily annoyed or irritable 1 0 0 0  Afraid - awful might happen 0 0 0 0  Total GAD 7 Score 6 0 0 0  Anxiety Difficulty  Not difficult at all Not difficult at all Not difficult at all    Immunization History  Administered Date(s) Administered   Fluad Quad(high Dose 65+) 03/18/2020, 01/21/2022   Influenza Split 03/08/2020   Influenza, High Dose Seasonal PF 01/16/2017, 01/29/2018, 02/08/2019   Influenza,inj,Quad PF,6+ Mos 01/29/2018   Influenza-Unspecified 02/10/2015, 03/18/2020, 01/09/2021   Pneumococcal Conjugate-13 12/23/2013, 03/18/2019   Pneumococcal Polysaccharide-23 01/29/2018   Td (Adult),5 Lf Tetanus Toxid, Preservative Free 11/26/2016   Tdap 04/12/2006,  11/26/2016   Zoster Recombinat (Shingrix) 02/08/2019     Past Medical History:  Diagnosis Date   Anemia    Anxiety    Arthritis    Blood transfusion 2012   Colon polyp    COVID-19 03/2019   DDD (degenerative disc disease), thoracolumbar    Degenerative disc disease, lumbar    Family history of adverse reaction to anesthesia    pts sister got very sick / burnt esophagus    Gastric ulcer    GERD (gastroesophageal reflux disease)    Heart murmur    History of hiatal hernia    Hypertension    Hypothyroidism    Migraines    Miscarriage    Osteoporosis    Pneumonia yrs ago   Syncope 2012   Wears glasses    Allergies  Allergen Reactions   Hydromorphone Itching, Nausea Only and Other (See Comments)    After IV dilaudid pt flushed and felt dizzy   Ciprofloxacin Other (See Comments)    Stomach problems   Codeine Itching   Latex Other (See Comments)    fainted   Morphine And Related Itching   Nsaids Nausea And Vomiting   Penicillins Itching and Nausea And Vomiting    Has patient had a PCN reaction causing immediate rash, facial/tongue/throat swelling, SOB or lightheadedness with hypotension: No Has patient had a PCN reaction causing severe rash involving mucus membranes or skin necrosis: No Has patient had a PCN reaction that required hospitalization No Has patient had a PCN reaction occurring within the last 10 years: Yes If all of the above answers are "NO", then may proceed with Cephalosporin use.    Sulfa Antibiotics Nausea And Vomiting   Past Surgical History:  Procedure Laterality Date   ABDOMINAL HYSTERECTOMY     ANTERIOR CERVICAL DECOMP/DISCECTOMY FUSION     C5-6 and C6-7 anterior cervical diskectomy and fusion    BILATERAL OOPHORECTOMY     CATARACT EXTRACTION W/PHACO Left 10/20/2014   Procedure: CATARACT EXTRACTION PHACO AND INTRAOCULAR LENS PLACEMENT LEFT EYE;  Surgeon: Tonny Branch, MD;  Location: [facility];  Service: Ophthalmology;  Laterality: Left;  CDE:6.98    CATARACT EXTRACTION W/PHACO Right 11/17/2014   Procedure: CATARACT EXTRACTION PHACO AND INTRAOCULAR LENS PLACEMENT RIGHT EYE CDE=6.57;  Surgeon: Tonny Branch, MD;  Location: [facility];  Service: Ophthalmology;  Laterality: Right;   COLONOSCOPY  01/19/2011   Procedure: COLONOSCOPY;  Surgeon: Rogene Houston, MD;  Location: [facility];  Service: Endoscopy;  Laterality: N/A;  2:00   COLONOSCOPY WITH PROPOFOL N/A 09/15/2021   Procedure: COLONOSCOPY WITH PROPOFOL;  Surgeon: Rogene Houston, MD;  Location: [facility];  Service: Endoscopy;  Laterality: N/A;  1230 ASA 2  DILATION AND CURETTAGE OF UTERUS     ESOPHAGOGASTRODUODENOSCOPY  11/05/2010   Procedure: ESOPHAGOGASTRODUODENOSCOPY (EGD);  Surgeon: Rogene Houston, MD;  Location: [facility];  Service: Endoscopy;  Laterality: N/A;  7:00 am per Melanie   ESOPHAGOGASTRODUODENOSCOPY N/A 07/25/2012   Procedure: ESOPHAGOGASTRODUODENOSCOPY (EGD);  Surgeon: Rogene Houston, MD;  Location: [facility];  Service: Endoscopy;  Laterality: N/A;  400-moved to 58 Ann notified pt   ESOPHAGOGASTRODUODENOSCOPY N/A 05/27/2015   Procedure: ESOPHAGOGASTRODUODENOSCOPY (EGD);  Surgeon: Rogene Houston, MD;  Location: [facility];  Service: Endoscopy;  Laterality: N/A;  2:00   HIATAL HERNIA REPAIR N/A 08/25/2015   Procedure: LAPAROSCOPIC REPAIR OF LARGE TYPE III  HIATAL HERNIA;  Surgeon: Johnathan Hausen, MD;  Location: [facility];  Service: General;  Laterality: N/A;   LAPAROSCOPIC NISSEN FUNDOPLICATION N/A 12/17/9478   Procedure: ENDOSCOPY, HIATAL HERNIA REPAIR, AND TAKE DOWN OF NISSEN FUNDOPILICATION;  Surgeon: Johnathan Hausen, MD;  Location: [facility];  Service: General;  Laterality: N/A;   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   2 children Kerin Ransom and Anne Ng)    Retired, 2 yrs college.    Caffeine beverages.   Takes a daily vitamin.   Wears her seatbelt. Smoke detector in the home.    Feels safe in her relationships.       Right handed              Allergies as of 04/27/2022       Reactions   Hydromorphone Itching, Nausea Only, Other (See Comments)   After IV dilaudid pt flushed and felt dizzy   Ciprofloxacin Other (See Comments)   Stomach problems   Codeine Itching   Latex Other (See Comments)   fainted   Morphine And Related Itching   Nsaids Nausea And Vomiting   Penicillins Itching, Nausea And Vomiting   Has patient had a PCN reaction causing immediate rash, facial/tongue/throat swelling, SOB or lightheadedness with hypotension: No Has patient had a PCN reaction causing severe rash involving mucus membranes or skin necrosis: No Has patient had a PCN reaction that required hospitalization No Has patient had a PCN reaction occurring within the last 10 years: Yes If all of the above answers are "NO", then may proceed with Cephalosporin use.   Sulfa Antibiotics Nausea And Vomiting        Medication List        Accurate as of April 27, 2022  2:35 PM. If you have any questions, ask your nurse or doctor.          STOP taking these medications    cefdinir 300 MG capsule Commonly known as: OMNICEF Stopped by: Howard Pouch, DO   MELATONIN PO Stopped by: Howard Pouch, DO   OVER THE COUNTER MEDICATION Stopped by: Howard Pouch, DO   predniSONE 20 MG tablet Commonly known as: DELTASONE Stopped by:  Rondall Radigan, DO   SLEEP AID PO Stopped by: Howard Pouch, DO       TAKE these medications    acetaminophen 325 MG tablet Commonly known as: TYLENOL Take 325 mg by  mouth every 6 (six) hours as needed for mild pain (For pain.).   amLODipine 2.5 MG tablet Commonly known as: NORVASC Take 1 tablet (2.5 mg total) by mouth 2 (two) times daily.   atorvastatin 20 MG tablet Commonly known as: LIPITOR Take 1 tablet (20 mg total) by mouth daily.   donepezil 10 MG disintegrating tablet Commonly known as: ARICEPT ODT Take 1 tablet (10 mg total) by mouth at bedtime.   esomeprazole 40 MG capsule Commonly known as: NEXIUM Take 1 capsule (40 mg total) by mouth daily before breakfast.   famotidine 40 MG tablet Commonly known as: PEPCID Take 1 tablet (40 mg total) by mouth at bedtime.   ipratropium 0.06 % nasal spray Commonly known as: ATROVENT Place 2 sprays into both nostrils 4 (four) times daily.   levothyroxine 50 MCG tablet Commonly known as: SYNTHROID Take 1 tablet (50 mcg total) by mouth daily.   losartan 100 MG tablet Commonly known as: COZAAR Take 1 tablet (100 mg total) by mouth daily.   MULTIVITAMIN GUMMIES WOMENS PO Take 2 tablets by mouth daily.   sertraline 100 MG tablet Commonly known as: ZOLOFT Take 1 tablet (100 mg total) by mouth at bedtime.        All past medical history, surgical history, allergies, family history, immunizations andmedications were updated in the EMR today and reviewed under the history and medication portions of their EMR.     No results found.  ROS 14 pt review of systems performed and negative (unless mentioned in an HPI)  Objective: BP 108/62   Pulse 69   Temp 98.7 F (37.1 C) (Oral)   Ht '5\' 7"'$  (1.702 m)   Wt 194 lb (88 kg)   SpO2 98%   BMI 30.38 kg/m  Physical Exam Vitals and nursing note reviewed.  Constitutional:      General: She is not in acute distress.    Appearance: Normal appearance. She is not ill-appearing or toxic-appearing.  HENT:     Head: Normocephalic and atraumatic.     Right Ear: Tympanic membrane, ear canal and external ear normal. There is no impacted cerumen.      Left Ear: Tympanic membrane, ear canal and external ear normal. There is no impacted cerumen.     Nose: No congestion or rhinorrhea.     Mouth/Throat:     Mouth: Mucous membranes are moist.     Pharynx: Oropharynx is clear. No oropharyngeal exudate or posterior oropharyngeal erythema.  Eyes:     General: No scleral icterus.       Right eye: No discharge.        Left eye: No discharge.     Extraocular Movements: Extraocular movements intact.     Conjunctiva/sclera: Conjunctivae normal.     Pupils: Pupils are equal, round, and reactive to light.  Cardiovascular:     Rate and Rhythm: Normal rate and regular rhythm.     Pulses: Normal pulses.     Heart sounds: Normal heart sounds. No murmur heard.    No friction rub. No gallop.  Pulmonary:     Effort: Pulmonary effort is normal. No respiratory distress.     Breath sounds: Normal breath sounds. No stridor. No wheezing, rhonchi or rales.  Chest:     Chest  wall: No tenderness.  Abdominal:     General: Abdomen is flat. Bowel sounds are normal. There is no distension.     Palpations: Abdomen is soft. There is no mass.     Tenderness: There is no abdominal tenderness. There is no right CVA tenderness, left CVA tenderness, guarding or rebound.     Hernia: No hernia is present.  Musculoskeletal:        General: No swelling, tenderness or deformity. Normal range of motion.     Cervical back: Normal range of motion and neck supple. No rigidity or tenderness.     Right lower leg: No edema.     Left lower leg: No edema.  Lymphadenopathy:     Cervical: No cervical adenopathy.  Skin:    General: Skin is warm and dry.     Coloration: Skin is not jaundiced or pale.     Findings: No bruising, erythema, lesion or rash.  Neurological:     General: No focal deficit present.     Mental Status: She is alert and oriented to person, place, and time. Mental status is at baseline.     Cranial Nerves: No cranial nerve deficit.     Sensory: No sensory  deficit.     Motor: No weakness.     Coordination: Coordination normal.     Gait: Gait normal.     Deep Tendon Reflexes: Reflexes normal.  Psychiatric:        Mood and Affect: Mood normal.        Behavior: Behavior normal.        Thought Content: Thought content normal.        Judgment: Judgment normal.    No results found.  Assessment/plan: Debra Sanchez is a 82 y.o. female present for cmc Anxiety/insomnia Stable Continue zoloft 100 mg QD   Essential hypertension/HLD/on statin therapy/overweight Stable Continue losartan 100 mg Qd  continue amlodipine 2.5 BID Continue liptor 20 mg qd Discussed weight loss- dietary modifications and exercise.  CBC, CMP, TSH, lipids and A1c collected today  Hypothyroidism, unspecified type  Stable Continue levothyroxine 50 mcg daily. 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  - now est with neuro and pulm.  - encouraged her to have the sleep study completed with Dr. Halford Chessman.  -continue  Aricept to 10 mg nightly  Pulmonary nodules Low risk of ca, pul nodules have been stable. No further image needed.   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  These do seem to be worsening for her.  Since she has been feeling more winded over the last 6 months now, I suggested she be evaluated by cardiology for possible arrhythmia, or other cardiac causes and she is agreeable to this today.  Routine general medical examination at a health care facility Mammogram: completed:declines mammogram.  Immunizations: tdap UTD 2018, Influenza UTD 01/2022 (encouraged yearly), PNA  series completed, Shingrix x1 only-declined DEXA: last completed 2023 result -1.2 Patient was encouraged to exercise greater than 150 minutes a week. Patient was encouraged to choose a diet filled with fresh fruits and vegetables, and lean meats. AVS provided to patient today for education/recommendation on gender specific health and  safety maintenance.   Return in about 6 months (around 10/18/2022) for Routine chronic condition follow-up.   Orders Placed This Encounter  Procedures   CBC   Comprehensive metabolic panel   Hemoglobin A1c   Lipid panel   TSH   T4, free   Ambulatory referral to Cardiology   Meds ordered this encounter  Medications   amLODipine (NORVASC) 2.5 MG tablet    Sig: Take 1 tablet (2.5 mg total) by mouth 2 (two) times daily.    Dispense:  180 tablet    Refill:  1   atorvastatin (LIPITOR) 20 MG tablet    Sig: Take 1 tablet (20 mg total) by mouth daily.    Dispense:  90 tablet    Refill:  3   donepezil (ARICEPT ODT) 10 MG disintegrating tablet    Sig: Take 1 tablet (10 mg total) by mouth at bedtime.    Dispense:  90 tablet    Refill:  1    Please use this prescription, and discontinue lower dose.  Dose change.   esomeprazole (NEXIUM) 40 MG capsule    Sig: Take 1 capsule (40 mg total) by mouth daily before breakfast.    Dispense:  90 capsule    Refill:  3    Requesting 1 year supply   famotidine (PEPCID) 40 MG tablet    Sig: Take 1 tablet (40 mg total) by mouth at bedtime.    Dispense:  90 tablet    Refill:  3   losartan (COZAAR) 100 MG tablet    Sig: Take 1 tablet (100 mg total) by mouth daily.    Dispense:  90 tablet    Refill:  1   sertraline (ZOLOFT) 100 MG tablet    Sig: Take 1 tablet (100 mg total) by mouth at bedtime.    Dispense:  90 tablet    Refill:  1   Referral Orders         Ambulatory referral to Cardiology       Electronically signed by: Howard Pouch, [place]

## 2022-04-28 ENCOUNTER — Ambulatory Visit: Payer: Medicare Other | Admitting: Orthopaedic Surgery

## 2022-04-28 ENCOUNTER — Telehealth: Payer: Self-pay | Admitting: Family Medicine

## 2022-04-28 DIAGNOSIS — R7989 Other specified abnormal findings of blood chemistry: Secondary | ICD-10-CM

## 2022-04-28 LAB — LIPID PANEL
Cholesterol: 146 mg/dL (ref <200)
HDL: 48 mg/dL — ABNORMAL LOW (ref >=50)
LDL Cholesterol (Calc): 76 mg/dL (calc)
Non-HDL Cholesterol (Calc): 98 mg/dL (calc) (ref <130)
Total CHOL/HDL Ratio: 3 (calc) (ref <5.0)
Triglycerides: 141 mg/dL (ref <150)

## 2022-04-28 LAB — CBC
HCT: 38 % (ref 35.0–45.0)
Hemoglobin: 12.7 g/dL (ref 11.7–15.5)
MCH: 31.2 pg (ref 27.0–33.0)
MCHC: 33.4 g/dL (ref 32.0–36.0)
MCV: 93.4 fL (ref 80.0–100.0)
MPV: 11.7 fL (ref 7.5–12.5)
Platelets: 167 10*3/uL (ref 140–400)
RBC: 4.07 10*6/uL (ref 3.80–5.10)
RDW: 12.7 % (ref 11.0–15.0)
WBC: 5.6 10*3/uL (ref 3.8–10.8)

## 2022-04-28 LAB — HEMOGLOBIN A1C
Hgb A1c MFr Bld: 5.5 % of total Hgb (ref <5.7)
Mean Plasma Glucose: 111 mg/dL
eAG (mmol/L): 6.2 mmol/L

## 2022-04-28 LAB — COMPREHENSIVE METABOLIC PANEL
AG Ratio: 1.8 (calc) (ref 1.0–2.5)
ALT: 13 U/L (ref 6–29)
AST: 14 U/L (ref 10–35)
Albumin: 3.9 g/dL (ref 3.6–5.1)
Alkaline phosphatase (APISO): 84 U/L (ref 37–153)
BUN/Creatinine Ratio: 13 (calc) (ref 6–22)
BUN: 15 mg/dL (ref 7–25)
CO2: 26 mmol/L (ref 20–32)
Calcium: 9.1 mg/dL (ref 8.6–10.4)
Chloride: 108 mmol/L (ref 98–110)
Creat: 1.14 mg/dL — ABNORMAL HIGH (ref 0.60–0.95)
Globulin: 2.2 g/dL (calc) (ref 1.9–3.7)
Glucose, Bld: 90 mg/dL (ref 65–99)
Potassium: 3.8 mmol/L (ref 3.5–5.3)
Sodium: 143 mmol/L (ref 135–146)
Total Bilirubin: 0.4 mg/dL (ref 0.2–1.2)
Total Protein: 6.1 g/dL (ref 6.1–8.1)

## 2022-04-28 LAB — TSH: TSH: 3.46 mIU/L (ref 0.40–4.50)

## 2022-04-28 LAB — T4, FREE: Free T4: 1 ng/dL (ref 0.8–1.8)

## 2022-04-28 MED ORDER — LEVOTHYROXINE SODIUM 50 MCG PO TABS: 50.0000 ug | ORAL_TABLET | Freq: Every day | ORAL | 3 refills | Status: DC

## 2022-04-28 NOTE — Telephone Encounter (Signed)
Please call patient Debra Sanchez  and thyroid function are normal.  I called in refills on her thyroid medication, same dose. Blood cell counts and electrolytes are normal Diabetes screening/A1c is normal  Cholesterol panel is at goal for her with an LDL of 76.  Continue Lipitor.  Kidney function is mildly decreased.  Again, this is just very mild decrease and it may have been secondary to her being fasting.  I would encourage her to make sure she hydrates well daily.  We will monitor this again at her follow-up in 5 and half months

## 2022-04-28 NOTE — Telephone Encounter (Signed)
Spoke with patient regarding results/recommendations.  

## 2022-05-16 ENCOUNTER — Encounter: Payer: Self-pay | Admitting: Internal Medicine

## 2022-05-16 ENCOUNTER — Ambulatory Visit: Payer: Medicare Other | Attending: Internal Medicine | Admitting: Internal Medicine

## 2022-05-16 VITALS — BP 140/70 | HR 76 | Ht 68.0 in | Wt 189.2 lb

## 2022-05-16 DIAGNOSIS — Z136 Encounter for screening for cardiovascular disorders: Secondary | ICD-10-CM | POA: Insufficient documentation

## 2022-05-16 DIAGNOSIS — Z01810 Encounter for preprocedural cardiovascular examination: Secondary | ICD-10-CM

## 2022-05-16 DIAGNOSIS — E782 Mixed hyperlipidemia: Secondary | ICD-10-CM | POA: Diagnosis not present

## 2022-05-16 DIAGNOSIS — I10 Essential (primary) hypertension: Secondary | ICD-10-CM

## 2022-05-16 DIAGNOSIS — R0989 Other specified symptoms and signs involving the circulatory and respiratory systems: Secondary | ICD-10-CM | POA: Insufficient documentation

## 2022-05-16 DIAGNOSIS — R002 Palpitations: Secondary | ICD-10-CM

## 2022-05-16 NOTE — Patient Instructions (Addendum)
Medication Instructions:  Your physician recommends that you continue on your current medications as directed. Please refer to the Current Medication list given to you today.  Labwork: none  Testing/Procedures: Your physician has requested that you have a carotid duplex. This test is an ultrasound of the carotid arteries in your neck. It looks at blood flow through these arteries that supply the brain with blood. Allow one hour for this exam. There are no restrictions or special instructions. Calcium Score  Follow-Up: Your physician recommends that you schedule a follow-up appointment in: 1 year. You will receive a reminder call in the mail in about 10 months reminding you to call and schedule your appointment. If you don't receive this call, please contact our office.  Any Other Special Instructions Will Be Listed Below (If Applicable).  If you need a refill on your cardiac medications before your next appointment, please call your pharmacy.

## 2022-05-16 NOTE — Progress Notes (Addendum)
Cardiology Office Note  Date: 05/16/2022   ID: Debra Sanchez, DOB 1940/12/14, MRN [id number]  PCP:  Ma Hillock, DO  Cardiologist:  Chalmers Guest, MD Electrophysiologist:  None   Reason for Office Visit: Evaluation of palpitations at the request of Dr. Raoul Pitch   History of Present Illness: Debra Sanchez is a 82 y.o. female known to have HTN, hypothyroidism was referred to cardiology clinic for evaluation of palpitations.  Patient was initially referred to cardiology clinic in 2016 for syncope.  Echocardiogram from 2016 showed no evidence of structural heart disease and normal LVEF. Syncope was deemed to be secondary to vasovagal event, especially after IV placed for preop for eye surgery.  She is again referred back for evaluation of palpitations. She reported feeling funny in her chest (like heart racing) and lasted for a minute or so. No prior history of palpitations or any recurrences. Has dizziness when she stands up from sitting position quickly otherwise denies any dizziness with exertion.  No syncope but had in the past, denies any leg swelling. Past Medical History:  Diagnosis Date   Anemia    Anxiety    Arthritis    Blood transfusion 2012   Colon polyp    COVID-19 03/2019   DDD (degenerative disc disease), thoracolumbar    Degenerative disc disease, lumbar    Family history of adverse reaction to anesthesia    pts sister got very sick / burnt esophagus    Gastric ulcer    GERD (gastroesophageal reflux disease)    Heart murmur    History of hiatal hernia    Hypertension    Hypothyroidism    Migraines    Miscarriage    Osteoporosis    Pneumonia yrs ago   Syncope 2012   Wears glasses     Past Surgical History:  Procedure Laterality Date   ABDOMINAL HYSTERECTOMY     ANTERIOR CERVICAL DECOMP/DISCECTOMY FUSION     C5-6 and C6-7 anterior cervical diskectomy and fusion    BILATERAL OOPHORECTOMY     CATARACT EXTRACTION W/PHACO Left 10/20/2014    Procedure: CATARACT EXTRACTION PHACO AND INTRAOCULAR LENS PLACEMENT LEFT EYE;  Surgeon: Tonny Branch, MD;  Location: [facility];  Service: Ophthalmology;  Laterality: Left;  CDE:6.98   CATARACT EXTRACTION W/PHACO Right 11/17/2014   Procedure: CATARACT EXTRACTION PHACO AND INTRAOCULAR LENS PLACEMENT RIGHT EYE CDE=6.57;  Surgeon: Tonny Branch, MD;  Location: [facility];  Service: Ophthalmology;  Laterality: Right;   COLONOSCOPY  01/19/2011   Procedure: COLONOSCOPY;  Surgeon: Rogene Houston, MD;  [facility];  Service: Endoscopy;  Laterality: N/A;  2:00   COLONOSCOPY WITH PROPOFOL N/A 09/15/2021   Procedure: COLONOSCOPY WITH PROPOFOL;  Surgeon: Rogene Houston, MD;  [facility];  Service: Endoscopy;  Laterality: N/A;  1230 ASA 2   DILATION AND CURETTAGE OF UTERUS     ESOPHAGOGASTRODUODENOSCOPY  11/05/2010   Procedure: ESOPHAGOGASTRODUODENOSCOPY (EGD);  Surgeon: Rogene Houston, MD;  [facility];  Service: Endoscopy;  Laterality: N/A;  7:00 am per Melanie   ESOPHAGOGASTRODUODENOSCOPY N/A 07/25/2012   Procedure: ESOPHAGOGASTRODUODENOSCOPY (EGD);  Surgeon: Rogene Houston, MD;  [facility];  Service: Endoscopy;  Laterality: N/A;  400-moved to 35 Ann notified pt   ESOPHAGOGASTRODUODENOSCOPY N/A 05/27/2015   Procedure: ESOPHAGOGASTRODUODENOSCOPY (EGD);  Surgeon: Rogene Houston, MD;  [facility];  Service: Endoscopy;  Laterality: N/A;  2:00   HIATAL HERNIA REPAIR N/A 08/25/2015   Procedure:  LAPAROSCOPIC REPAIR OF LARGE TYPE III  HIATAL HERNIA;  Surgeon: Johnathan Hausen, MD;  Location: [facility];  Service: General;  Laterality: N/A;   LAPAROSCOPIC NISSEN FUNDOPLICATION N/A [id number]   Procedure: ENDOSCOPY, HIATAL HERNIA REPAIR, AND TAKE DOWN OF NISSEN FUNDOPILICATION;  Surgeon: Johnathan Hausen, MD;  Location: [facility];  Service: General;  Laterality: N/A;   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      amLODipine (NORVASC) 2.5 MG tablet Take 1 tablet (2.5 mg total) by mouth 2 (two) times daily. 180 tablet 1   atorvastatin (LIPITOR) 20 MG tablet Take 1 tablet (20 mg total) by mouth daily. 90 tablet 3   donepezil (ARICEPT ODT) 10 MG disintegrating tablet Take 1 tablet (10 mg total) by mouth at bedtime. 90 tablet 1   esomeprazole (NEXIUM) 40 MG capsule Take 1 capsule (40 mg total) by mouth daily before breakfast. 90 capsule 3   famotidine (PEPCID) 40 MG tablet Take 1 tablet (40 mg total) by mouth at bedtime. 90 tablet 3   ipratropium (ATROVENT) 0.06 % nasal spray Place 2 sprays into both nostrils 4 (four) times daily. 15 mL 12   levothyroxine (SYNTHROID) 50 MCG tablet Take 1 tablet (50 mcg total) by mouth daily. 90 tablet 3   losartan (COZAAR) 100 MG tablet Take 1 tablet (100 mg total) by mouth daily. 90 tablet 1   Melatonin 10 MG TABS Take 1-2 tablets by mouth at bedtime.     Multiple Vitamins-Minerals (MULTIVITAMIN GUMMIES WOMENS PO) Take 2 tablets by mouth daily.     NON FORMULARY Apply 1 Application topically daily as needed. Hemp Cream     sertraline (ZOLOFT) 100 MG tablet Take 1 tablet (100 mg total) by mouth at bedtime. 90 tablet 1   No current facility-administered medications for this visit.   Allergies:  Hydromorphone, Ciprofloxacin, Codeine, Latex, Morphine and related, Nsaids, Penicillins, and Sulfa antibiotics   Social History: The patient  reports that she has never smoked. She has never been exposed to tobacco smoke. She has never used smokeless tobacco. She reports that she does not drink alcohol and does not use drugs.   Family History: The patient's family history includes Arthritis in her brother, father, maternal aunt, maternal uncle, mother, and sister; Colon cancer in her mother; Diabetes in her  maternal uncle; Heart disease in her father and mother; Lung cancer in her brother.   ROS:  Please see the history of present illness. Otherwise, complete review of systems is positive for none.  All other systems are reviewed and negative.   Physical Exam: VS:  BP (!) 140/70   Pulse 76   Ht '5\' 8"'$  (1.727 m)   Wt 189 lb 3.2 oz (85.8 kg)   SpO2 95%   BMI 28.77 kg/m , BMI Body mass index is 28.77 kg/m.  Wt Readings from Last 3 Encounters:  05/16/22 189 lb 3.2 oz (85.8 kg)  04/27/22 194 lb (88 kg)  03/17/22 195 lb (88.5 kg)    General: Patient appears comfortable at rest. HEENT: Conjunctiva and lids normal, oropharynx clear with moist mucosa. Neck: Supple, no elevated JVP.  Mild carotid bruits, no thyromegaly. Lungs: Clear to auscultation, nonlabored breathing at rest. Cardiac: Regular rate  and rhythm, no S3 or significant systolic murmur, no pericardial rub. Abdomen: Soft, nontender, no hepatomegaly, bowel sounds present, no guarding or rebound. Extremities: No pitting edema, distal pulses 2+. Skin: Warm and dry. Musculoskeletal: No kyphosis. Neuropsychiatric: Alert and oriented x3, affect grossly appropriate.  ECG:  An ECG dated 05/16/2022 was personally reviewed today and demonstrated:  Normal sinus rhythm  Recent Labwork: 04/27/2022: ALT 13; AST 14; BUN 15; Creat 1.14; Hemoglobin 12.7; Platelets 167; Potassium 3.8; Sodium 143; TSH 3.46     Component Value Date/Time   CHOL 146 04/27/2022 1338   TRIG 141 04/27/2022 1338   HDL 48 (L) 04/27/2022 1338   CHOLHDL 3.0 04/27/2022 1338   VLDL 18.4 04/26/2021 1123   LDLCALC 76 04/27/2022 1338    Other Studies Reviewed Today: I personally reviewed the heart care referral records from the PCP  Assessment and Plan: Patient is a 82 year old F known to have HTN, hypothyroidism was referred to cardiology clinic for evaluation of palpitations.  Addendum # Pre-op cardiac risk stratification for knee replacement - Patient has no known  CAD. No symptoms other than infrequent nature of palpitations which is benign. Based on RCRI score of 0, patient is at a low risk for any peri-operative cardiac complications. No further cardiac testing is indicated prior to proceeding with the planned procedure.  # Palpitations -Palpitations lasting for a minute or so and has no prior history of similar quality of palpitations. No recurrence of the therapy due to infrequent nature of palpitations, no further cardiac testing is indicated at this time. However if she experiences palpitations frequently, she will need to call the clinic for event monitor.  # Screening for cardiovascular condition -Obtain CT calcium scoring of coronaries  # Carotid bruit -Obtain carotid ultrasound bilateral  # HTN, controlled -Continue amlodipine 2.5 mg twice daily -Continue losartan 100 mg once daily -Management of HTN per PCP  I have spent a total of 45 minutes with patient reviewing chart, EKGs, labs and examining patient as well as establishing an assessment and plan that was discussed with the patient.  > 50% of time was spent in direct patient care.     Medication Adjustments/Labs and Tests Ordered: Current medicines are reviewed at length with the patient today.  Concerns regarding medicines are outlined above.   Tests Ordered: Orders Placed This Encounter  Procedures   CT CARDIAC SCORING (SELF PAY ONLY)   EKG 12-Lead   VAS US CAROTID    Medication Changes: No orders of the defined types were placed in this encounter.   Disposition:  Follow up  one year  Signed Laquan Beier Fidel Levy, MD, 05/16/2022 1:29 PM    [facility], [place], [place] [address]

## 2022-06-02 ENCOUNTER — Encounter: Payer: Self-pay | Admitting: Orthopaedic Surgery

## 2022-06-02 ENCOUNTER — Ambulatory Visit (INDEPENDENT_AMBULATORY_CARE_PROVIDER_SITE_OTHER): Payer: Medicare Other | Admitting: Orthopaedic Surgery

## 2022-06-02 VITALS — Ht 68.0 in | Wt 190.0 lb

## 2022-06-02 DIAGNOSIS — M1711 Unilateral primary osteoarthritis, right knee: Secondary | ICD-10-CM | POA: Diagnosis not present

## 2022-06-02 NOTE — Progress Notes (Signed)
Office Visit Note   Patient: Debra Sanchez           Date of Birth: Aug 26, 1940           MRN: [id number] Visit Date: 06/02/2022              Requested by: Ma Hillock, DO [address] [place],  [place] [address] PCP: Ma Hillock, DO   Assessment & Plan: Visit Diagnoses:  1. Unilateral primary osteoarthritis, right knee     Plan: We discussed total knee arthroplasty reviewed most recent x-rays.  She has failed anti-inflammatories as well as Tylenol, intra-articular injections and use of a cane.  She like to proceed with total knee arthroplasty right knee.  We reviewed the radiographs and she does have significant arthritis in her opposite left knee as well.  Procedure discussed questions were elicited and answered.  She request we proceed.  Follow-Up Instructions: No follow-ups on file.   Orders:  No orders of the defined types were placed in this encounter.  No orders of the defined types were placed in this encounter.     Procedures: No procedures performed   Clinical Data: No additional findings.   Subjective: Chief Complaint  Patient presents with   Right Knee - Pain   Left Knee - Pain    HPI 82 year old female returns had previous right knee injection 03/17/2022 and states her knees are getting worse.  Her knee started to slip out of joint she has difficulty getting from sitting to standing problems walking difficulty with stairs somewhat worse on the right than left and states she needs to get something done for this problem.  Good progress point where she is concerned about falling.  X-rays 03/17/2022 showed moderate knee arthritis severe in the patellofemoral joint with marginal osteophytes subchondral sclerosis and joint space narrowing.  Review of Systems history of hypertension GERD.  Previous Nissen fundoplication for hiatal hernia and reflux.  Positive right knee arthritis.  Previous history of 8 mm right temporal meningioma.  Negative for heart  attack or stroke.   Objective: Vital Signs: Ht '5\' 8"'$  (1.727 m)   Wt 190 lb (86.2 kg)   BMI 28.89 kg/m   Physical Exam Constitutional:      Appearance: She is well-developed.  HENT:     Head: Normocephalic.     Right Ear: External ear normal.     Left Ear: External ear normal. There is no impacted cerumen.  Eyes:     Pupils: Pupils are equal, round, and reactive to light.  Neck:     Thyroid: No thyromegaly.     Trachea: No tracheal deviation.  Cardiovascular:     Rate and Rhythm: Normal rate.  Pulmonary:     Effort: Pulmonary effort is normal.  Abdominal:     Palpations: Abdomen is soft.  Musculoskeletal:     Cervical back: No rigidity.  Skin:    General: Skin is warm and dry.  Neurological:     Mental Status: She is alert and oriented to person, place, and time.  Psychiatric:        Behavior: Behavior normal.     Ortho Exam negative logroll of the hips distal pulses are intact.  Crepitus right knee range of motion positive patellofemoral loading more medial than lateral joint line tenderness lateral ligaments are stable ACL PCL exam is normal.  Pedal pulses are 2+.  Knee does reach full extension she flexes to 110 degrees.  She is Dealer  with a right knee limp.  Specialty Comments:  No specialty comments available.  Imaging: No results found.   PMFS History: Patient Active Problem List   Diagnosis Date Noted   Unilateral primary osteoarthritis, right knee 06/02/2022   Screening for cardiovascular condition 05/16/2022   Palpitations 05/16/2022   Carotid bruit 05/16/2022   Increase in creatinine 04/28/2022   Bilateral primary osteoarthritis of knee 03/17/2022   Memory changes 10/19/2021   Meningioma ([place]), 8 mm right temporal 03/18/2021   Esophageal dysphagia 10/13/2020   Continuous leakage of urine 03/18/2020   Mixed hyperlipidemia 03/19/2019   GERD (gastroesophageal reflux disease) 12/18/2018   Anxiety 10/22/2018   Overweight (BMI 25.0-29.9) 10/22/2018    Chronic pain of both knees 01/29/2018   Persistent headaches 11/18/2016   Hiatal hernia with GERD 06/17/2016   Status post laparoscopic Nissen fundoplication [id number]   Pulmonary nodules 05/12/2015   Insomnia 04/20/2015   Hypertension 05/15/2012   Hypothyroidism 05/15/2012   History of colonic polyps 05/15/2012   Anemia 05/15/2012   Heme positive stool 05/15/2012   Past Medical History:  Diagnosis Date   Anemia    Anxiety    Arthritis    Blood transfusion 2012   Colon polyp    COVID-19 03/2019   DDD (degenerative disc disease), thoracolumbar    Degenerative disc disease, lumbar    Family history of adverse reaction to anesthesia    pts sister got very sick / burnt esophagus    Gastric ulcer    GERD (gastroesophageal reflux disease)    Heart murmur    History of hiatal hernia    Hypertension    Hypothyroidism    Migraines    Miscarriage    Osteoporosis    Pneumonia yrs ago   Syncope 2012   Wears glasses     Family History  Problem Relation Age of Onset   Colon cancer Mother    Heart disease Mother    Arthritis Mother    Heart disease Father    Arthritis Father    Arthritis Sister    Arthritis Brother    Lung cancer Brother        smoker   Arthritis Maternal Aunt    Arthritis Maternal Uncle    Diabetes Maternal Uncle     Past Surgical History:  Procedure Laterality Date   ABDOMINAL HYSTERECTOMY     ANTERIOR CERVICAL DECOMP/DISCECTOMY FUSION     C5-6 and C6-7 anterior cervical diskectomy and fusion    BILATERAL OOPHORECTOMY     CATARACT EXTRACTION W/PHACO Left 10/20/2014   Procedure: CATARACT EXTRACTION PHACO AND INTRAOCULAR LENS PLACEMENT LEFT EYE;  Surgeon: Tonny Branch, MD;  Location: [facility];  Service: Ophthalmology;  Laterality: Left;  CDE:6.98   CATARACT EXTRACTION W/PHACO Right 11/17/2014   Procedure: CATARACT EXTRACTION PHACO AND INTRAOCULAR LENS PLACEMENT RIGHT EYE CDE=6.57;  Surgeon: Tonny Branch, MD;  Location: [facility];  Service: Ophthalmology;   Laterality: Right;   COLONOSCOPY  01/19/2011   Procedure: COLONOSCOPY;  Surgeon: Rogene Houston, MD;  [facility];  Service: Endoscopy;  Laterality: N/A;  2:00   COLONOSCOPY WITH PROPOFOL N/A 09/15/2021   Procedure: COLONOSCOPY WITH PROPOFOL;  Surgeon: Rogene Houston, MD;  [facility];  Service: Endoscopy;  Laterality: N/A;  1230 ASA 2   DILATION AND CURETTAGE OF UTERUS     ESOPHAGOGASTRODUODENOSCOPY  11/05/2010   Procedure: ESOPHAGOGASTRODUODENOSCOPY (EGD);  Surgeon: Rogene Houston, MD;  [facility];  Service: Endoscopy;  Laterality: N/A;  7:00 am per Melanie   ESOPHAGOGASTRODUODENOSCOPY N/A 07/25/2012   Procedure: ESOPHAGOGASTRODUODENOSCOPY (EGD);  Surgeon: Rogene Houston, MD;  [facility];  Service: Endoscopy;  Laterality: N/A;  400-moved to 26 Ann notified pt   ESOPHAGOGASTRODUODENOSCOPY N/A 05/27/2015   Procedure: ESOPHAGOGASTRODUODENOSCOPY (EGD);  Surgeon: Rogene Houston, MD;  [facility];  Service: Endoscopy;  Laterality: N/A;  2:00   HIATAL HERNIA REPAIR N/A 08/25/2015   Procedure: LAPAROSCOPIC REPAIR OF LARGE TYPE III  HIATAL HERNIA;  Surgeon: Johnathan Hausen, MD;  Location: [facility];  Service: General;  Laterality: N/A;   LAPAROSCOPIC NISSEN FUNDOPLICATION N/A [id number]   Procedure: ENDOSCOPY, HIATAL HERNIA REPAIR, AND TAKE DOWN OF NISSEN FUNDOPILICATION;  Surgeon: Johnathan Hausen, MD;  Location: [facility];  Service: General;  Laterality: N/A;   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

## 2022-06-03 ENCOUNTER — Telehealth: Payer: Self-pay | Admitting: *Deleted

## 2022-06-03 NOTE — Telephone Encounter (Signed)
   Pre-operative Risk Assessment    Patient Name: Debra Sanchez  DOB: 01/12/1941 MRN: [id number]      Request for Surgical Clearance    Procedure:   Right total knee arthroplasty  Date of Surgery:  Clearance TBD                                 Surgeon:  Dr. Rodell Perna  Surgeon's Group or Practice Name:  [facility]  Phone number:  [phone] Fax number:  [phone]   Type of Clearance Requested:   - Medical    Type of Anesthesia:  Spinal   Additional requests/questions:    Cathlean Cower   06/03/2022, 2:40 PM

## 2022-06-06 ENCOUNTER — Other Ambulatory Visit: Payer: Self-pay | Admitting: Family Medicine

## 2022-06-06 DIAGNOSIS — I10 Essential (primary) hypertension: Secondary | ICD-10-CM

## 2022-06-06 NOTE — Telephone Encounter (Signed)
   Patient Name: Debra Sanchez  DOB: [birth year] MRN: [id number]  Primary Cardiologist: Chalmers Guest, MD  Chart reviewed as part of pre-operative protocol coverage. Patient was recently seen by Dr. Dellia Cloud on 05/16/2022 for further evaluation of palpitations. Given infrequent nature of palpitations, no further cardiac testing was felt to be indicated. In regards to pre-op risk stratification, per Dr. Marsa Aris note: "Patient has no known CAD. No symptoms other than infrequent nature of palpitations which is benign. Based on RCRI score of 0, patient is at a low risk for any peri-operative cardiac complications. No further cardiac testing is indicated prior to proceeding with the planned procedure."  I will route this recommendation to the requesting party via Epic fax function and remove from pre-op pool.  Please call with questions.  Darreld Mclean, PA-C 06/06/2022, 5:18 PM

## 2022-06-08 ENCOUNTER — Telehealth: Payer: Self-pay

## 2022-06-08 NOTE — Telephone Encounter (Signed)
Debra Sanchez, Debra Sanchez, Chesney Suares K Pt called to cancel surgery. Stated she was 58 and didn't want to go through with it.

## 2022-06-09 NOTE — Telephone Encounter (Signed)
FYI

## 2022-06-24 ENCOUNTER — Other Ambulatory Visit: Payer: Self-pay | Admitting: Family Medicine

## 2022-06-24 DIAGNOSIS — I10 Essential (primary) hypertension: Secondary | ICD-10-CM

## 2022-06-29 ENCOUNTER — Ambulatory Visit: Payer: Medicare Other | Admitting: Physician Assistant

## 2022-06-30 ENCOUNTER — Ambulatory Visit (HOSPITAL_COMMUNITY): Payer: Medicare Other

## 2022-06-30 ENCOUNTER — Encounter (INDEPENDENT_AMBULATORY_CARE_PROVIDER_SITE_OTHER): Payer: Self-pay | Admitting: Gastroenterology

## 2022-07-04 ENCOUNTER — Ambulatory Visit (HOSPITAL_COMMUNITY): Payer: Medicare Other

## 2022-07-05 ENCOUNTER — Other Ambulatory Visit: Payer: Self-pay | Admitting: Family Medicine

## 2022-07-06 ENCOUNTER — Encounter: Payer: Self-pay | Admitting: Orthopaedic Surgery

## 2022-07-06 ENCOUNTER — Ambulatory Visit (INDEPENDENT_AMBULATORY_CARE_PROVIDER_SITE_OTHER): Payer: Medicare Other | Admitting: Orthopaedic Surgery

## 2022-07-06 VITALS — Ht 68.0 in | Wt 190.0 lb

## 2022-07-06 DIAGNOSIS — M1711 Unilateral primary osteoarthritis, right knee: Secondary | ICD-10-CM

## 2022-07-06 NOTE — H&P (View-Only) (Signed)
Office Visit Note   Patient: Debra Sanchez           Date of Birth: Jan 30, 1941           MRN: [id number] Visit Date: 07/06/2022              Requested by: Natalia Leatherwood, DO [address] [place],  [place] [address] PCP: Natalia Leatherwood, DO   Assessment & Plan: Visit Diagnoses:  1. Unilateral primary osteoarthritis, right knee     Plan: Reviewed x-rays discussed again while left total knee arthroplasty path overnight stay in the hospital use of Exparel, Marcaine, adductor block, home physical therapy x 2 weeks, outpatient therapy.  Importance of compliance with therapy.  We discussed flexion extension quad strengthening.  Questions elicited and answered she understands and agrees to proceed.  Follow-Up Instructions: No follow-ups on file.   Orders:  No orders of the defined types were placed in this encounter.  No orders of the defined types were placed in this encounter.     Procedures: No procedures performed   Clinical Data: No additional findings.   Subjective: Chief Complaint  Patient presents with   Right Knee - Pain   Left Knee - Pain    HPI 82 year old female returns with right knee primary osteoarthritis failed conservative treatment including anti-inflammatories injections use of a cane.  X-rays showed knee arthritis marginal osteophytes subchondral sclerosis joint space narrowing worse on the right knee than left knee.  She has been amatory with a limp.  She has been scheduled for surgery but then got "cold feet" and returns here to discuss surgery with her daughter.  Patient's PCP is  Dr. Felix Pacini. Review of Systems hypertension GERD.  Previous Nissen fundoplication for hiatal hernia and reflux.  Positive right knee arthritis.  Previous history of 8 mm right temporal meningioma.  Negative for heart attack or stroke.  No history of DVT no history of anesthetic or bleeding problems.  Multiple pain medications caused her to itch not true allergic  reaction.       Objective: Vital Signs: Ht  (1.727 m)   Wt 190 lb (86.2 kg)   BMI 28.89 kg/m   Physical Exam Constitutional:      Appearance: She is well-developed.  HENT:     Head: Normocephalic.     Right Ear: External ear normal.     Left Ear: External ear normal. There is no impacted cerumen.  Eyes:     Pupils: Pupils are equal, round, and reactive to light.  Neck:     Thyroid: No thyromegaly.     Trachea: No tracheal deviation.  Cardiovascular:     Rate and Rhythm: Normal rate.  Pulmonary:     Effort: Pulmonary effort is normal.  Abdominal:     Palpations: Abdomen is soft.  Musculoskeletal:     Cervical back: No rigidity.  Skin:    General: Skin is warm and dry.  Neurological:     Mental Status: She is alert and oriented to person, place, and time.  Psychiatric:        Behavior: Behavior normal.     Ortho Exam negative logroll hips distal pulses are intact.  Crepitus with knee range of motion 2+ knee effusion medial lateral joint line tenderness she is amatory with a right knee limp.  Specialty Comments:  No specialty comments available.  Imaging: No results found.   PMFS History: Patient Active Problem List   Diagnosis Date Noted  Unilateral primary osteoarthritis, right knee 06/02/2022   Preop cardiovascular exam 05/16/2022   Palpitations 05/16/2022   Carotid bruit 05/16/2022   Increase in creatinine 04/28/2022   Bilateral primary osteoarthritis of knee 03/17/2022   Memory changes 10/19/2021   Meningioma ([facility]), 8 mm right temporal 03/18/2021   Esophageal dysphagia 10/13/2020   Continuous leakage of urine 03/18/2020   Mixed hyperlipidemia 03/19/2019   GERD (gastroesophageal reflux disease) 12/18/2018   Anxiety 10/22/2018   Overweight (BMI 25.0-29.9) 10/22/2018   Chronic pain of both knees 01/29/2018   Persistent headaches 11/18/2016   Hiatal hernia with GERD 06/17/2016   Status post laparoscopic Nissen fundoplication 08/25/2015    Pulmonary nodules 05/12/2015   Insomnia 04/20/2015   Hypertension 05/15/2012   Hypothyroidism 05/15/2012   History of colonic polyps 05/15/2012   Anemia 05/15/2012   Heme positive stool 05/15/2012   Past Medical History:  Diagnosis Date   Anemia    Anxiety    Arthritis    Blood transfusion 2012   Colon polyp    COVID-19 03/2019   DDD (degenerative disc disease), thoracolumbar    Degenerative disc disease, lumbar    Family history of adverse reaction to anesthesia    pts sister got very sick / burnt esophagus    Gastric ulcer    GERD (gastroesophageal reflux disease)    Heart murmur    History of hiatal hernia    Hypertension    Hypothyroidism    Migraines    Miscarriage    Osteoporosis    Pneumonia yrs ago   Syncope 2012   Wears glasses     Family History  Problem Relation Age of Onset   Colon cancer Mother    Heart disease Mother    Arthritis Mother    Heart disease Father    Arthritis Father    Arthritis Sister    Arthritis Brother    Lung cancer Brother        smoker   Arthritis Maternal Aunt    Arthritis Maternal Uncle    Diabetes Maternal Uncle     Past Surgical History:  Procedure Laterality Date   ABDOMINAL HYSTERECTOMY     ANTERIOR CERVICAL DECOMP/DISCECTOMY FUSION     C5-6 and C6-7 anterior cervical diskectomy and fusion    BILATERAL OOPHORECTOMY     CATARACT EXTRACTION W/PHACO Left 10/20/2014   Procedure: CATARACT EXTRACTION PHACO AND INTRAOCULAR LENS PLACEMENT LEFT EYE;  Surgeon: Gemma Payor, MD;  Location: [facility];  Service: Ophthalmology;  Laterality: Left;  CDE:6.98   CATARACT EXTRACTION W/PHACO Right 11/17/2014   Procedure: CATARACT EXTRACTION PHACO AND INTRAOCULAR LENS PLACEMENT RIGHT EYE CDE=6.57;  Surgeon: Gemma Payor, MD;  Location: [facility];  Service: Ophthalmology;  Laterality: Right;   COLONOSCOPY  01/19/2011   Procedure: COLONOSCOPY;  Surgeon: Malissa Hippo, MD;  [facility];  Service: Endoscopy;  Laterality: N/A;  2:00    COLONOSCOPY WITH PROPOFOL N/A 09/15/2021   Procedure: COLONOSCOPY WITH PROPOFOL;  Surgeon: Malissa Hippo, MD;  [facility];  Service: Endoscopy;  Laterality: N/A;  1230 ASA 2   DILATION AND CURETTAGE OF UTERUS     ESOPHAGOGASTRODUODENOSCOPY  11/05/2010   Procedure: ESOPHAGOGASTRODUODENOSCOPY (EGD);  Surgeon: Malissa Hippo, MD;  [facility];  Service: Endoscopy;  Laterality: N/A;  7:00 am per Melanie   ESOPHAGOGASTRODUODENOSCOPY N/A 07/25/2012   Procedure: ESOPHAGOGASTRODUODENOSCOPY (EGD);  Surgeon: Malissa Hippo, MD;  [facility];  Service: Endoscopy;  Laterality: N/A;  400-moved to  420 Ann notified pt   ESOPHAGOGASTRODUODENOSCOPY N/A 05/27/2015   Procedure: ESOPHAGOGASTRODUODENOSCOPY (EGD);  Surgeon: Malissa Hippo, MD;  [facility];  Service: Endoscopy;  Laterality: N/A;  2:00   HIATAL HERNIA REPAIR N/A 08/25/2015   Procedure: LAPAROSCOPIC REPAIR OF LARGE TYPE III  HIATAL HERNIA;  Surgeon: Luretha Murphy, MD;  [facility]: [facility];  Service: General;  Laterality: N/A;   LAPAROSCOPIC NISSEN FUNDOPLICATION N/A 06/17/2016   Procedure: ENDOSCOPY, HIATAL HERNIA REPAIR, AND TAKE DOWN OF NISSEN FUNDOPILICATION;  Surgeon: Luretha Murphy, MD;  [facility]: [facility];  Service: General;  Laterality: N/A;   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

## 2022-07-06 NOTE — Progress Notes (Unsigned)
Office Visit Note   Patient: Debra Sanchez           Date of Birth: 1940/05/18           MRN: [id number] Visit Date: 07/06/2022              Requested by: Ma Hillock, DO [address] [place],  [place] [address] PCP: Ma Hillock, DO   Assessment & Plan: Visit Diagnoses: No diagnosis found.  Plan: ***  Follow-Up Instructions: No follow-ups on file.   Orders:  No orders of the defined types were placed in this encounter.  No orders of the defined types were placed in this encounter.     Procedures: No procedures performed   Clinical Data: No additional findings.   Subjective: Chief Complaint  Patient presents with   Right Knee - Pain   Left Knee - Pain    HPI  Review of Systems   Objective: Vital Signs: Ht 5\' 8"  (1.727 m)   Wt 190 lb (86.2 kg)   BMI 28.89 kg/m   Physical Exam  Ortho Exam  Specialty Comments:  No specialty comments available.  Imaging: No results found.   PMFS History: Patient Active Problem List   Diagnosis Date Noted   Unilateral primary osteoarthritis, right knee 06/02/2022   Preop cardiovascular exam 05/16/2022   Palpitations 05/16/2022   Carotid bruit 05/16/2022   Increase in creatinine 04/28/2022   Bilateral primary osteoarthritis of knee 03/17/2022   Memory changes 10/19/2021   Meningioma ([place]), 8 mm right temporal 03/18/2021   Esophageal dysphagia 10/13/2020   Continuous leakage of urine 03/18/2020   Mixed hyperlipidemia 03/19/2019   GERD (gastroesophageal reflux disease) 12/18/2018   Anxiety 10/22/2018   Overweight (BMI 25.0-29.9) 10/22/2018   Chronic pain of both knees 01/29/2018   Persistent headaches 11/18/2016   Hiatal hernia with GERD 06/17/2016   Status post laparoscopic Nissen fundoplication [id number]   Pulmonary nodules 05/12/2015   Insomnia 04/20/2015   Hypertension 05/15/2012   Hypothyroidism 05/15/2012   History of colonic polyps 05/15/2012   Anemia 05/15/2012   Heme positive stool  05/15/2012   Past Medical History:  Diagnosis Date   Anemia    Anxiety    Arthritis    Blood transfusion 2012   Colon polyp    COVID-19 03/2019   DDD (degenerative disc disease), thoracolumbar    Degenerative disc disease, lumbar    Family history of adverse reaction to anesthesia    pts sister got very sick / burnt esophagus    Gastric ulcer    GERD (gastroesophageal reflux disease)    Heart murmur    History of hiatal hernia    Hypertension    Hypothyroidism    Migraines    Miscarriage    Osteoporosis    Pneumonia yrs ago   Syncope 2012   Wears glasses     Family History  Problem Relation Age of Onset   Colon cancer Mother    Heart disease Mother    Arthritis Mother    Heart disease Father    Arthritis Father    Arthritis Sister    Arthritis Brother    Lung cancer Brother        smoker   Arthritis Maternal Aunt    Arthritis Maternal Uncle    Diabetes Maternal Uncle     Past Surgical History:  Procedure Laterality Date   ABDOMINAL HYSTERECTOMY     ANTERIOR CERVICAL DECOMP/DISCECTOMY FUSION     C5-6  and C6-7 anterior cervical diskectomy and fusion    BILATERAL OOPHORECTOMY     CATARACT EXTRACTION W/PHACO Left 10/20/2014   Procedure: CATARACT EXTRACTION PHACO AND INTRAOCULAR LENS PLACEMENT LEFT EYE;  Surgeon: Tonny Branch, MD;  [facility]: [facility];  Service: Ophthalmology;  Laterality: Left;  CDE:6.98   CATARACT EXTRACTION W/PHACO Right 11/17/2014   Procedure: CATARACT EXTRACTION PHACO AND INTRAOCULAR LENS PLACEMENT RIGHT EYE CDE=6.57;  Surgeon: Tonny Branch, MD;  [facility];  Service: Ophthalmology;  Laterality: Right;   COLONOSCOPY  01/19/2011   Procedure: COLONOSCOPY;  Surgeon: Rogene Houston, MD;  [facility];  Service: Endoscopy;  Laterality: N/A;  2:00   COLONOSCOPY WITH PROPOFOL N/A 09/15/2021   Procedure: COLONOSCOPY WITH PROPOFOL;  Surgeon: Rogene Houston, MD;  [facility];  Service: Endoscopy;  Laterality: N/A;  1230 ASA 2    DILATION AND CURETTAGE OF UTERUS     ESOPHAGOGASTRODUODENOSCOPY  11/05/2010   Procedure: ESOPHAGOGASTRODUODENOSCOPY (EGD);  Surgeon: Rogene Houston, MD;  [facility];  Service: Endoscopy;  Laterality: N/A;  7:00 am per Melanie   ESOPHAGOGASTRODUODENOSCOPY N/A 07/25/2012   Procedure: ESOPHAGOGASTRODUODENOSCOPY (EGD);  Surgeon: Rogene Houston, MD;  [facility];  Service: Endoscopy;  Laterality: N/A;  400-moved to 35 Ann notified pt   ESOPHAGOGASTRODUODENOSCOPY N/A 05/27/2015   Procedure: ESOPHAGOGASTRODUODENOSCOPY (EGD);  Surgeon: Rogene Houston, MD;  [facility];  Service: Endoscopy;  Laterality: N/A;  2:00   HIATAL HERNIA REPAIR N/A 08/25/2015   Procedure: LAPAROSCOPIC REPAIR OF LARGE TYPE III  HIATAL HERNIA;  Surgeon: Johnathan Hausen, MD;  [facility];  Service: General;  Laterality: N/A;   LAPAROSCOPIC NISSEN FUNDOPLICATION N/A [id number]   Procedure: ENDOSCOPY, HIATAL HERNIA REPAIR, AND TAKE DOWN OF NISSEN FUNDOPILICATION;  Surgeon: Johnathan Hausen, MD;  [facility];  Service: General;  Laterality: N/A;   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

## 2022-07-06 NOTE — Progress Notes (Unsigned)
Office Visit Note   Patient: Debra Sanchez           Date of Birth: May 11, 1940           MRN: [id number] Visit Date: 07/06/2022              Requested by: Ma Hillock, DO [address] [place],  [place] [address] PCP: Ma Hillock, DO   Assessment & Plan: Visit Diagnoses: No diagnosis found.  Plan: ***  Follow-Up Instructions: No follow-ups on file.   Orders:  No orders of the defined types were placed in this encounter.  No orders of the defined types were placed in this encounter.     Procedures: No procedures performed   Clinical Data: No additional findings.   Subjective: Chief Complaint  Patient presents with   Right Knee - Pain   Left Knee - Pain    HPI  Review of Systems   Objective: Vital Signs: Ht 5\' 8"  (1.727 m)   Wt 190 lb (86.2 kg)   BMI 28.89 kg/m   Physical Exam  Ortho Exam  Specialty Comments:  No specialty comments available.  Imaging: No results found.   PMFS History: Patient Active Problem List   Diagnosis Date Noted   Unilateral primary osteoarthritis, right knee 06/02/2022   Preop cardiovascular exam 05/16/2022   Palpitations 05/16/2022   Carotid bruit 05/16/2022   Increase in creatinine 04/28/2022   Bilateral primary osteoarthritis of knee 03/17/2022   Memory changes 10/19/2021   Meningioma ([place]), 8 mm right temporal 03/18/2021   Esophageal dysphagia 10/13/2020   Continuous leakage of urine 03/18/2020   Mixed hyperlipidemia 03/19/2019   GERD (gastroesophageal reflux disease) 12/18/2018   Anxiety 10/22/2018   Overweight (BMI 25.0-29.9) 10/22/2018   Chronic pain of both knees 01/29/2018   Persistent headaches 11/18/2016   Hiatal hernia with GERD 06/17/2016   Status post laparoscopic Nissen fundoplication [id number]   Pulmonary nodules 05/12/2015   Insomnia 04/20/2015   Hypertension 05/15/2012   Hypothyroidism 05/15/2012   History of colonic polyps 05/15/2012   Anemia 05/15/2012   Heme positive stool  05/15/2012   Past Medical History:  Diagnosis Date   Anemia    Anxiety    Arthritis    Blood transfusion 2012   Colon polyp    COVID-19 03/2019   DDD (degenerative disc disease), thoracolumbar    Degenerative disc disease, lumbar    Family history of adverse reaction to anesthesia    pts sister got very sick / burnt esophagus    Gastric ulcer    GERD (gastroesophageal reflux disease)    Heart murmur    History of hiatal hernia    Hypertension    Hypothyroidism    Migraines    Miscarriage    Osteoporosis    Pneumonia yrs ago   Syncope 2012   Wears glasses     Family History  Problem Relation Age of Onset   Colon cancer Mother    Heart disease Mother    Arthritis Mother    Heart disease Father    Arthritis Father    Arthritis Sister    Arthritis Brother    Lung cancer Brother        smoker   Arthritis Maternal Aunt    Arthritis Maternal Uncle    Diabetes Maternal Uncle     Past Surgical History:  Procedure Laterality Date   ABDOMINAL HYSTERECTOMY     ANTERIOR CERVICAL DECOMP/DISCECTOMY FUSION     C5-6  and C6-7 anterior cervical diskectomy and fusion    BILATERAL OOPHORECTOMY     CATARACT EXTRACTION W/PHACO Left 10/20/2014   Procedure: CATARACT EXTRACTION PHACO AND INTRAOCULAR LENS PLACEMENT LEFT EYE;  Surgeon: Tonny Branch, MD;  [facility];  Service: Ophthalmology;  Laterality: Left;  CDE:6.98   CATARACT EXTRACTION W/PHACO Right 11/17/2014   Procedure: CATARACT EXTRACTION PHACO AND INTRAOCULAR LENS PLACEMENT RIGHT EYE CDE=6.57;  Surgeon: Tonny Branch, MD;  [facility];  Service: Ophthalmology;  Laterality: Right;   COLONOSCOPY  01/19/2011   Procedure: COLONOSCOPY;  Surgeon: Rogene Houston, MD;  [facility];  Service: Endoscopy;  Laterality: N/A;  2:00   COLONOSCOPY WITH PROPOFOL N/A 09/15/2021   Procedure: COLONOSCOPY WITH PROPOFOL;  Surgeon: Rogene Houston, MD;  [facility];  Service: Endoscopy;  Laterality: N/A;  1230 ASA 2    DILATION AND CURETTAGE OF UTERUS     ESOPHAGOGASTRODUODENOSCOPY  11/05/2010   Procedure: ESOPHAGOGASTRODUODENOSCOPY (EGD);  Surgeon: Rogene Houston, MD;  [facility];  Service: Endoscopy;  Laterality: N/A;  7:00 am per Melanie   ESOPHAGOGASTRODUODENOSCOPY N/A 07/25/2012   Procedure: ESOPHAGOGASTRODUODENOSCOPY (EGD);  Surgeon: Rogene Houston, MD;  [facility];  Service: Endoscopy;  Laterality: N/A;  400-moved to 61 Ann notified pt   ESOPHAGOGASTRODUODENOSCOPY N/A 05/27/2015   Procedure: ESOPHAGOGASTRODUODENOSCOPY (EGD);  Surgeon: Rogene Houston, MD;  [facility];  Service: Endoscopy;  Laterality: N/A;  2:00   HIATAL HERNIA REPAIR N/A 08/25/2015   Procedure: LAPAROSCOPIC REPAIR OF LARGE TYPE III  HIATAL HERNIA;  Surgeon: Johnathan Hausen, MD;  [facility];  Service: General;  Laterality: N/A;   LAPAROSCOPIC NISSEN FUNDOPLICATION N/A [id number]   Procedure: ENDOSCOPY, HIATAL HERNIA REPAIR, AND TAKE DOWN OF NISSEN FUNDOPILICATION;  Surgeon: Johnathan Hausen, MD;  [facility];  Service: General;  Laterality: N/A;   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

## 2022-07-11 ENCOUNTER — Telehealth: Payer: Self-pay | Admitting: Orthopaedic Surgery

## 2022-07-11 ENCOUNTER — Telehealth: Payer: Self-pay

## 2022-07-11 ENCOUNTER — Ambulatory Visit: Admit: 2022-07-11 | Payer: Medicare Other | Admitting: Orthopaedic Surgery

## 2022-07-11 SURGERY — ARTHROPLASTY, KNEE, TOTAL
Anesthesia: Spinal | Site: Knee | Laterality: Right

## 2022-07-11 NOTE — Telephone Encounter (Signed)
Surgical clearance forms received on 07/06/22. Patient has been scheduled on 07/18/22 to surgical clearance appt. Forms have been placed on PCP desk for signature. Pt had CPE on 04/27/22, please confirm if pt is needing appt. Pt is currently scheduled for 07/18/22 as VV

## 2022-07-11 NOTE — Telephone Encounter (Signed)
PUT SYNVISC ONE INJECTION SUBMISSION FORM APRIL J DESK 07/11/22

## 2022-07-12 NOTE — Telephone Encounter (Signed)
faxed

## 2022-07-12 NOTE — Telephone Encounter (Signed)
Completed Ortho care form for her preoperative consultation for medical clearance.  Patient has already had cardiac clearance appointment with cardiology 05/16/2022.

## 2022-07-14 ENCOUNTER — Other Ambulatory Visit: Payer: Self-pay

## 2022-07-14 DIAGNOSIS — M170 Bilateral primary osteoarthritis of knee: Secondary | ICD-10-CM

## 2022-07-14 NOTE — Telephone Encounter (Signed)
Received VOB.  Patient no longer needing gel injection.

## 2022-07-15 ENCOUNTER — Other Ambulatory Visit: Payer: Self-pay | Admitting: Physician Assistant

## 2022-07-18 ENCOUNTER — Telehealth: Payer: Medicare Other | Admitting: Family Medicine

## 2022-07-21 ENCOUNTER — Encounter: Payer: Medicare Other | Admitting: Orthopaedic Surgery

## 2022-07-21 NOTE — Pre-Procedure Instructions (Signed)
Surgical Instructions    Your procedure is scheduled on July 27, 2022.  Report to [facility] Main Entrance "A" at 10:30 A.M., then check in with the Admitting office.  Call this number if you have problems the morning of surgery:  [phone]  If you have any questions prior to your surgery date call [phone]: Open Monday-Friday 8am-4pm If you experience any cold or flu symptoms such as cough, fever, chills, shortness of breath, etc. between now and your scheduled surgery, please notify us at the above number.     Remember:  Do not eat after midnight the night before your surgery  You may drink clear liquids until 9:30 AM the morning of your surgery.   Clear liquids allowed are: Water, Non-Citrus Juices (without pulp), Carbonated Beverages, Clear Tea, Black Coffee Only (NO MILK, CREAM OR POWDERED CREAMER of any kind), and Gatorade.     Take these medicines the morning of surgery with A SIP OF WATER:  amLODipine (NORVASC)   atorvastatin (LIPITOR)   esomeprazole (NEXIUM)   levothyroxine (SYNTHROID)    May take these medicines IF NEEDED:  acetaminophen (TYLENOL)   albuterol (VENTOLIN HFA) inhaler   Carboxymethylcellul-Glycerin (CLEAR EYES FOR DRY EYES OP)    As of today, STOP taking any Aspirin (unless otherwise instructed by your surgeon) Aleve, Naproxen, Ibuprofen, Motrin, Advil, Goody's, BC's, all herbal medications, fish oil, and all vitamins.                     Do NOT Smoke (Tobacco/Vaping) for 24 hours prior to your procedure.  If you use a CPAP at night, you may bring your mask/headgear for your overnight stay.   Contacts, glasses, piercing's, hearing aid's, dentures or partials may not be worn into surgery, please bring cases for these belongings.    For patients admitted to the hospital, discharge time will be determined by your treatment team.   Patients discharged the day of surgery will not be allowed to drive home, and someone needs to stay with them for 24  hours.  SURGICAL WAITING ROOM VISITATION Patients having surgery or a procedure may have no more than 2 support people in the waiting area - these visitors may rotate.   Children under the age of 69 must have an adult with them who is not the patient. If the patient needs to stay at the hospital during part of their recovery, the visitor guidelines for inpatient rooms apply. Pre-op nurse will coordinate an appropriate time for 1 support person to accompany patient in pre-op.  This support person may not rotate.   Please refer to the [facility] website for the visitor guidelines for Inpatients (after your surgery is over and you are in a regular room).   Please follow the instructions on the handout you received at your pre-admission appointment about preparing for your upcoming surgery using the CHG surgical soap. If you have any questions or concerns, please call one of the numbers listed on the first page of this paperwork.   If you received a COVID test during your pre-op visit  it is requested that you wear a mask when out in public, stay away from anyone that may not be feeling well and notify your surgeon if you develop symptoms. If you have been in contact with anyone that has tested positive in the last 10 days please notify you surgeon.

## 2022-07-22 ENCOUNTER — Other Ambulatory Visit: Payer: Self-pay

## 2022-07-22 ENCOUNTER — Other Ambulatory Visit: Payer: Self-pay | Admitting: Family Medicine

## 2022-07-22 ENCOUNTER — Encounter (HOSPITAL_COMMUNITY)
Admission: RE | Admit: 2022-07-22 | Discharge: 2022-07-22 | Disposition: A | Discharge status: Home / Self Care | Payer: Medicare Other | Source: Ambulatory Visit | Attending: Orthopaedic Surgery | Admitting: Orthopaedic Surgery

## 2022-07-22 ENCOUNTER — Other Ambulatory Visit (HOSPITAL_COMMUNITY): Payer: Medicare Other

## 2022-07-22 ENCOUNTER — Encounter (HOSPITAL_COMMUNITY): Payer: Self-pay

## 2022-07-22 VITALS — BP 130/74 | HR 72 | Temp 97.8°F | Resp 18 | Ht 68.0 in | Wt 194.8 lb

## 2022-07-22 DIAGNOSIS — I10 Essential (primary) hypertension: Secondary | ICD-10-CM | POA: Diagnosis not present

## 2022-07-22 DIAGNOSIS — K219 Gastro-esophageal reflux disease without esophagitis: Secondary | ICD-10-CM | POA: Diagnosis not present

## 2022-07-22 DIAGNOSIS — M1711 Unilateral primary osteoarthritis, right knee: Secondary | ICD-10-CM | POA: Insufficient documentation

## 2022-07-22 DIAGNOSIS — R002 Palpitations: Secondary | ICD-10-CM | POA: Insufficient documentation

## 2022-07-22 DIAGNOSIS — Z01812 Encounter for preprocedural laboratory examination: Secondary | ICD-10-CM | POA: Insufficient documentation

## 2022-07-22 DIAGNOSIS — K449 Diaphragmatic hernia without obstruction or gangrene: Secondary | ICD-10-CM | POA: Insufficient documentation

## 2022-07-22 DIAGNOSIS — I2510 Atherosclerotic heart disease of native coronary artery without angina pectoris: Secondary | ICD-10-CM | POA: Insufficient documentation

## 2022-07-22 DIAGNOSIS — Z01818 Encounter for other preprocedural examination: Secondary | ICD-10-CM

## 2022-07-22 HISTORY — DX: Polyneuropathy, unspecified: G62.9

## 2022-07-22 HISTORY — DX: Paralytic syndrome, unspecified: G83.9

## 2022-07-22 LAB — BASIC METABOLIC PANEL
Anion gap: 6 (ref 5–15)
BUN: 12 mg/dL (ref 8–23)
CO2: 27 mmol/L (ref 22–32)
Calcium: 9.3 mg/dL (ref 8.9–10.3)
Chloride: 105 mmol/L (ref 98–111)
Creatinine, Ser: 0.96 mg/dL (ref 0.44–1.00)
GFR, Estimated: 59 mL/min — ABNORMAL LOW (ref >60)
Glucose, Bld: 95 mg/dL (ref 70–99)
Potassium: 3.8 mmol/L (ref 3.5–5.1)
Sodium: 138 mmol/L (ref 135–145)

## 2022-07-22 LAB — CBC
HCT: 39.9 % (ref 36.0–46.0)
Hemoglobin: 12.6 g/dL (ref 12.0–15.0)
MCH: 30 pg (ref 26.0–34.0)
MCHC: 31.6 g/dL (ref 30.0–36.0)
MCV: 95 fL (ref 80.0–100.0)
Platelets: 177 10*3/uL (ref 150–400)
RBC: 4.2 MIL/uL (ref 3.87–5.11)
RDW: 13.2 % (ref 11.5–15.5)
WBC: 5.9 10*3/uL (ref 4.0–10.5)
nRBC: 0 % (ref 0.0–0.2)

## 2022-07-22 LAB — SURGICAL PCR SCREEN
MRSA, PCR: NEGATIVE
Staphylococcus aureus: NEGATIVE

## 2022-07-22 NOTE — Progress Notes (Signed)
PCP - Felix Pacini, DO Cardiologist - Dr. Marjo Bicker  PPM/ICD - Denies Device Orders - n/a Rep Notified - n/a  Chest x-ray - n/a EKG - 05/16/2022 Stress Test - Denies ECHO - 05/28/2014 Cardiac Cath - Denies  Sleep Study - Denies CPAP - n/a  No DM  Last dose of GLP1 agonist- n/a GLP1 instructions: n/a  Blood Thinner Instructions: n/a Aspirin Instructions: n/a  ERAS Protcol - Clear liquids until 0930 morning of surgery PRE-SURGERY Ensure or G2- n/a  COVID TEST- n/a   Anesthesia review: Yes. Cardiac and Medical Clearance.   Patient denies shortness of breath, fever, cough and chest pain at PAT appointment. Pt denies any respiratory illness/infection in the last two months.   All instructions explained to the patient, with a verbal understanding of the material. Patient agrees to go over the instructions while at home for a better understanding. Patient also instructed to self quarantine after being tested for COVID-19. The opportunity to ask questions was provided.

## 2022-07-25 NOTE — Progress Notes (Signed)
Case: [id number] Date/Time: 07/27/22 1215   Procedure: RIGHT TOTAL KNEE ARTHROPLASTY (Right: Knee) - Needs RNFA   Anesthesia type: Spinal   Pre-op diagnosis: right knee osteoarthritis   [facility]: [facility] ROOM 08 / [facility]   Surgeons: [name], MD       DISCUSSION: Debra Sanchez is an 82 yo who presents to PAT clinic prior to surgery listed above on 07/27/22. PMH significant for HTN, possible OSAm hiatal hernia, GERD. Surgery originally scheduled for 4/1 however cancelled by patient due to having "cold feet". Now rescheduled for 4/17. No prior anesthesia complications. ASA 2   #Palpitations #Hx of syncope (2016) -Evaluated by Cardiology and determined to be benign. Syncope vasovagal in nature.  #Pre-op cardiac risk stratification for knee replacement -Provided at 05/16/22 OV:  "Patient has no known CAD. No symptoms other than infrequent nature of palpitations which is benign. Based on RCRI score of 0, patient is at a low risk for any peri-operative cardiac complications. No further cardiac testing is indicated prior to proceeding with the planned procedure."  #HTN -Followed by PCP. Controlled on current regimen  #Possible OSA -Referred to pulmonology for sleep study  #Hiatal hernia #GERD -On PPI   VS: BP 130/74   Pulse 72   Temp 36.6 C   Resp 18   Ht  (1.727 m)   Wt 88.4 kg   SpO2 100%   BMI 29.62 kg/m   PROVIDERS: Natalia Leatherwood, DO Cardiology: Dr. Orion Modest Mallipeddi    LABS: Labs reviewed: Acceptable for surgery. (all labs ordered are listed, but only abnormal results are displayed)  Labs Reviewed  BASIC METABOLIC PANEL - Abnormal; Notable for the following components:      Result Value   GFR, Estimated 59 (*)    All other components within normal limits  SURGICAL PCR SCREEN  CBC     IMAGES:  CXR 10/19/21:  IMPRESSION: No active cardiopulmonary disease.  Hiatal hernia.    CV:  EKG 05/16/22:  NSR   Echo 05/28/14:  Study Conclusions    - Left ventricle: The cavity size was normal. Wall thickness was    increased in a pattern of mild LVH. The estimated ejection    fraction was 60%. Wall motion was normal; there were no regional    wall motion abnormalities.  - Aortic valve: Sclerosis without stenosis. There was no    regurgitation.  - Right ventricle: The cavity size was normal. Systolic function    was normal.  - Inferior vena cava: The vessel was normal in size. The    respirophasic diameter changes were in the normal range (>= 50%),    consistent with normal central venous pressure.    Past Medical History:  Diagnosis Date   Anemia    HX from bleeding ulcer. 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   Pneumonia yrs ago   Syncope 2012   Wears glasses     Past Surgical History:  Procedure Laterality Date   ABDOMINAL HYSTERECTOMY     ANTERIOR CERVICAL DECOMP/DISCECTOMY FUSION     C5-6 and C6-7 anterior cervical diskectomy and fusion    BILATERAL OOPHORECTOMY     CATARACT EXTRACTION W/PHACO Left 10/20/2014   Procedure: CATARACT EXTRACTION PHACO AND INTRAOCULAR LENS PLACEMENT LEFT EYE;  Surgeon: Gemma Payor, MD;  Location: [facility];  Service: Ophthalmology;  Laterality: Left;  CDE:6.98   CATARACT EXTRACTION W/PHACO Right 11/17/2014   Procedure: CATARACT EXTRACTION PHACO AND INTRAOCULAR LENS PLACEMENT RIGHT EYE CDE=6.57;  Surgeon: Gemma Payor, MD;  Location: [facility];   Service: Ophthalmology;  Laterality: Right;   COLONOSCOPY  01/19/2011   Procedure: COLONOSCOPY;  Surgeon: Malissa Hippo, MD;  Location: [facility];  Service: Endoscopy;  Laterality: N/A;  2:00   COLONOSCOPY WITH PROPOFOL N/A 09/15/2021   Procedure: COLONOSCOPY WITH PROPOFOL;  Surgeon: Malissa Hippo, MD;  [facility]: [facility];  Service: Endoscopy;  Laterality: N/A;  1230 ASA 2   DILATION AND CURETTAGE OF UTERUS     ESOPHAGOGASTRODUODENOSCOPY  11/05/2010   Procedure: ESOPHAGOGASTRODUODENOSCOPY (EGD);  Surgeon: Malissa Hippo, MD;  [facility]: [facility];  Service: Endoscopy;  Laterality: N/A;  7:00 am per Melanie   ESOPHAGOGASTRODUODENOSCOPY N/A 07/25/2012   Procedure: ESOPHAGOGASTRODUODENOSCOPY (EGD);  Surgeon: Malissa Hippo, MD;  [facility];  Service: Endoscopy;  Laterality: N/A;  400-moved to 420 Ann notified pt   ESOPHAGOGASTRODUODENOSCOPY N/A 05/27/2015   Procedure: ESOPHAGOGASTRODUODENOSCOPY (EGD);  Surgeon: Malissa Hippo, MD;  Location: [facility];  Service: Endoscopy;  Laterality: N/A;  2:00   HIATAL HERNIA REPAIR N/A 08/25/2015   Procedure: LAPAROSCOPIC REPAIR OF LARGE TYPE III  HIATAL HERNIA;  Surgeon: Luretha Murphy, MD;  Location: [facility];  Service: General;  Laterality: N/A;   LAPAROSCOPIC NISSEN FUNDOPLICATION N/A 06/17/2016   Procedure: ENDOSCOPY, HIATAL HERNIA REPAIR, AND TAKE DOWN OF NISSEN FUNDOPILICATION;  Surgeon: Luretha Murphy, MD;  Location: [facility];  Service: General;  Laterality: N/A;   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      MEDICATIONS:  acetaminophen (TYLENOL) 500 MG tablet   albuterol (VENTOLIN HFA) 108 (90 Base) MCG/ACT inhaler   amLODipine (NORVASC) 2.5 MG tablet   atorvastatin (LIPITOR) 20 MG tablet   b complex vitamins capsule   Carboxymethylcellul-Glycerin (CLEAR EYES FOR DRY EYES OP)   donepezil (ARICEPT ODT) 10 MG disintegrating tablet   esomeprazole (NEXIUM) 40 MG capsule    famotidine (PEPCID) 40 MG tablet   levothyroxine (SYNTHROID) 50 MCG tablet   losartan (COZAAR) 100 MG tablet   Melatonin 10 MG TABS   NON FORMULARY   sertraline (ZOLOFT) 100 MG tablet   No current facility-administered medications for this encounter.    [name] [facility] Short Stay/Anesthesiology [facility] Phone [phone] 07/25/2022 11:33 AM

## 2022-07-25 NOTE — Anesthesia Preprocedure Evaluation (Signed)
Anesthesia Evaluation  Patient identified by MRN, date of birth, ID band Patient awake    Reviewed: Allergy & Precautions, H&P , NPO status , Patient's Chart, lab work & pertinent test results  Airway Mallampati: II  TM Distance: >3 FB Neck ROM: Full    Dental no notable dental hx.    Pulmonary neg pulmonary ROS   Pulmonary exam normal breath sounds clear to auscultation       Cardiovascular hypertension, Normal cardiovascular exam Rhythm:Regular Rate:Normal     Neuro/Psych negative neurological ROS  negative psych ROS   GI/Hepatic Neg liver ROS, hiatal hernia,GERD  ,,  Endo/Other  negative endocrine ROS    Renal/GU negative Renal ROS  negative genitourinary   Musculoskeletal  (+) Arthritis , Osteoarthritis,    Abdominal   Peds negative pediatric ROS (+)  Hematology negative hematology ROS (+)   Anesthesia Other Findings   Reproductive/Obstetrics negative OB ROS                             Anesthesia Physical Anesthesia Plan  ASA: 2  Anesthesia Plan: Spinal   Post-op Pain Management: Regional block*   Induction: Intravenous  PONV Risk Score and Plan: 2 and Ondansetron, Dexamethasone, Propofol infusion and Treatment may vary due to age or medical condition  Airway Management Planned: Simple Face Mask  Additional Equipment:   Intra-op Plan:   Post-operative Plan:   Informed Consent: I have reviewed the patients History and Physical, chart, labs and discussed the procedure including the risks, benefits and alternatives for the proposed anesthesia with the patient or authorized representative who has indicated his/her understanding and acceptance.     Dental advisory given  Plan Discussed with: CRNA and Surgeon  Anesthesia Plan Comments: (See PAT note from 4/12)        Anesthesia Quick Evaluation

## 2022-07-26 ENCOUNTER — Telehealth: Payer: Self-pay | Admitting: *Deleted

## 2022-07-26 NOTE — Telephone Encounter (Signed)
Ortho bundle pre-op call completed. 

## 2022-07-26 NOTE — Care Plan (Signed)
[facility] RNCM call to patient to discuss her upcoming Right total knee arthroplasty with Dr. Ophelia Charter on 07/27/22. She is an Ortho bundle patient through [facility] and is agreeable to case management. She has a grand-daughter that will be helping her after discharge at home. She will need a RW and 3in1/BSC prior to discharge from the hospital. Anticipate HHPT will be needed after a short hospital stay. Referral made to [facility] after choice provided. Reviewed post op care instructions. Will continue to follow for needs.

## 2022-07-27 ENCOUNTER — Observation Stay (HOSPITAL_COMMUNITY)
Admission: RE | Admit: 2022-07-27 | Discharge: 2022-07-28 | Disposition: A | Discharge status: Home / Self Care | Payer: Medicare Other | Attending: Orthopaedic Surgery | Admitting: Orthopaedic Surgery

## 2022-07-27 ENCOUNTER — Other Ambulatory Visit: Payer: Self-pay

## 2022-07-27 ENCOUNTER — Encounter (HOSPITAL_COMMUNITY): Payer: Self-pay | Admitting: Orthopaedic Surgery

## 2022-07-27 ENCOUNTER — Encounter (HOSPITAL_COMMUNITY)
Admission: RE | Disposition: A | Discharge status: Home / Self Care | Payer: Self-pay | Source: Home / Self Care | Attending: Orthopaedic Surgery

## 2022-07-27 ENCOUNTER — Ambulatory Visit (HOSPITAL_BASED_OUTPATIENT_CLINIC_OR_DEPARTMENT_OTHER): Payer: Medicare Other | Admitting: Anesthesiology

## 2022-07-27 ENCOUNTER — Ambulatory Visit (HOSPITAL_COMMUNITY): Payer: Medicare Other | Admitting: Medical

## 2022-07-27 DIAGNOSIS — Z96651 Presence of right artificial knee joint: Secondary | ICD-10-CM

## 2022-07-27 DIAGNOSIS — M1711 Unilateral primary osteoarthritis, right knee: Secondary | ICD-10-CM

## 2022-07-27 DIAGNOSIS — I10 Essential (primary) hypertension: Secondary | ICD-10-CM | POA: Diagnosis not present

## 2022-07-27 DIAGNOSIS — G8918 Other acute postprocedural pain: Secondary | ICD-10-CM | POA: Diagnosis not present

## 2022-07-27 DIAGNOSIS — Z8616 Personal history of COVID-19: Secondary | ICD-10-CM | POA: Diagnosis not present

## 2022-07-27 DIAGNOSIS — E039 Hypothyroidism, unspecified: Secondary | ICD-10-CM | POA: Diagnosis not present

## 2022-07-27 HISTORY — PX: TOTAL KNEE ARTHROPLASTY: SHX125

## 2022-07-27 SURGERY — ARTHROPLASTY, KNEE, TOTAL
Anesthesia: Monitor Anesthesia Care | Site: Knee | Laterality: Right

## 2022-07-27 MED ORDER — DONEPEZIL HCL 10 MG PO TABS
10.0000 mg | ORAL_TABLET | Freq: Every day | ORAL | Status: DC
  Administered 2022-07-27: 10 mg via ORAL
  Filled 2022-07-27: qty 1

## 2022-07-27 MED ORDER — ACETAMINOPHEN 10 MG/ML IV SOLN: 1000.0000 mg | Freq: Once | INTRAVENOUS | Status: DC | PRN

## 2022-07-27 MED ORDER — B COMPLEX VITAMINS PO CAPS: 1.0000 | ORAL_CAPSULE | Freq: Every day | ORAL | Status: DC

## 2022-07-27 MED ORDER — BUPIVACAINE HCL (PF) 0.25 % IJ SOLN
INTRAMUSCULAR | Status: DC | PRN
  Administered 2022-07-27: 30 mL

## 2022-07-27 MED ORDER — ASPIRIN 325 MG PO TBEC
325.0000 mg | DELAYED_RELEASE_TABLET | Freq: Every day | ORAL | Status: DC
  Filled 2022-07-27: qty 1

## 2022-07-27 MED ORDER — OXYCODONE HCL 5 MG PO TABS: 5.0000 mg | ORAL_TABLET | Freq: Once | ORAL | Status: DC | PRN

## 2022-07-27 MED ORDER — BISACODYL 10 MG RE SUPP: 10.0000 mg | Freq: Every day | RECTAL | Status: DC | PRN

## 2022-07-27 MED ORDER — POLYETHYLENE GLYCOL 3350 17 G PO PACK: 17.0000 g | PACK | Freq: Every day | ORAL | Status: DC | PRN

## 2022-07-27 MED ORDER — DIPHENHYDRAMINE HCL 12.5 MG/5ML PO ELIX: 12.5000 mg | ORAL_SOLUTION | ORAL | Status: DC | PRN

## 2022-07-27 MED ORDER — PANTOPRAZOLE SODIUM 40 MG PO TBEC
80.0000 mg | DELAYED_RELEASE_TABLET | Freq: Every day | ORAL | Status: DC
  Administered 2022-07-28: 80 mg via ORAL
  Filled 2022-07-27: qty 2

## 2022-07-27 MED ORDER — FENTANYL CITRATE (PF) 100 MCG/2ML IJ SOLN
INTRAMUSCULAR | Status: AC
  Administered 2022-07-27: 50 ug
  Filled 2022-07-27: qty 2

## 2022-07-27 MED ORDER — PHENYLEPHRINE HCL-NACL 20-0.9 MG/250ML-% IV SOLN
INTRAVENOUS | Status: DC | PRN
  Administered 2022-07-27: 40 ug/min via INTRAVENOUS

## 2022-07-27 MED ORDER — FERROUS SULFATE 325 (65 FE) MG PO TABS
325.0000 mg | ORAL_TABLET | Freq: Three times a day (TID) | ORAL | Status: DC
  Administered 2022-07-27 – 2022-07-28 (×2): 325 mg via ORAL
  Filled 2022-07-27 (×2): qty 1

## 2022-07-27 MED ORDER — MIDAZOLAM HCL 2 MG/2ML IJ SOLN
INTRAMUSCULAR | Status: AC
  Administered 2022-07-27: 0.5 mg
  Filled 2022-07-27: qty 2

## 2022-07-27 MED ORDER — PHENOL 1.4 % MT LIQD: 1.0000 | OROMUCOSAL | Status: DC | PRN

## 2022-07-27 MED ORDER — MELATONIN 5 MG PO TABS
20.0000 mg | ORAL_TABLET | Freq: Every day | ORAL | Status: DC
  Administered 2022-07-27: 20 mg via ORAL
  Filled 2022-07-27: qty 4

## 2022-07-27 MED ORDER — LEVOTHYROXINE SODIUM 50 MCG PO TABS
50.0000 ug | ORAL_TABLET | Freq: Every day | ORAL | Status: DC
  Administered 2022-07-28: 50 ug via ORAL
  Filled 2022-07-27: qty 1

## 2022-07-27 MED ORDER — BUPIVACAINE IN DEXTROSE 0.75-8.25 % IT SOLN
INTRATHECAL | Status: DC | PRN
  Administered 2022-07-27: 1.6 mL via INTRATHECAL

## 2022-07-27 MED ORDER — OXYCODONE HCL 5 MG PO TABS
5.0000 mg | ORAL_TABLET | ORAL | Status: DC | PRN
  Administered 2022-07-28: 5 mg via ORAL
  Filled 2022-07-27: qty 1

## 2022-07-27 MED ORDER — 0.9 % SODIUM CHLORIDE (POUR BTL) OPTIME
TOPICAL | Status: DC | PRN
  Administered 2022-07-27: 1000 mL

## 2022-07-27 MED ORDER — ONDANSETRON HCL 4 MG/2ML IJ SOLN
4.0000 mg | Freq: Four times a day (QID) | INTRAMUSCULAR | Status: DC | PRN
  Administered 2022-07-28 (×2): 4 mg via INTRAVENOUS
  Filled 2022-07-27 (×3): qty 2

## 2022-07-27 MED ORDER — METHOCARBAMOL 1000 MG/10ML IJ SOLN
500.0000 mg | Freq: Four times a day (QID) | INTRAVENOUS | Status: DC | PRN
  Administered 2022-07-27: 500 mg via INTRAVENOUS
  Filled 2022-07-27: qty 500

## 2022-07-27 MED ORDER — ONDANSETRON HCL 4 MG/2ML IJ SOLN: 4.0000 mg | Freq: Once | INTRAMUSCULAR | Status: DC | PRN

## 2022-07-27 MED ORDER — PROPOFOL 10 MG/ML IV BOLUS
INTRAVENOUS | Status: DC | PRN
  Administered 2022-07-27 (×2): 30 mg via INTRAVENOUS

## 2022-07-27 MED ORDER — CHLORHEXIDINE GLUCONATE 0.12 % MT SOLN
OROMUCOSAL | Status: AC
  Administered 2022-07-27: 15 mL via OROMUCOSAL
  Filled 2022-07-27: qty 15

## 2022-07-27 MED ORDER — DOCUSATE SODIUM 100 MG PO CAPS
100.0000 mg | ORAL_CAPSULE | Freq: Two times a day (BID) | ORAL | Status: DC
  Administered 2022-07-27 – 2022-07-28 (×2): 100 mg via ORAL
  Filled 2022-07-27 (×2): qty 1

## 2022-07-27 MED ORDER — ORAL CARE MOUTH RINSE: 15.0000 mL | Freq: Once | OROMUCOSAL | Status: AC

## 2022-07-27 MED ORDER — OXYCODONE HCL 5 MG PO TABS
10.0000 mg | ORAL_TABLET | ORAL | Status: DC | PRN
  Administered 2022-07-27: 10 mg via ORAL
  Administered 2022-07-27 – 2022-07-28 (×3): 15 mg via ORAL
  Filled 2022-07-27 (×4): qty 3

## 2022-07-27 MED ORDER — SODIUM CHLORIDE 0.9 % IR SOLN
Status: DC | PRN
  Administered 2022-07-27: 3000 mL

## 2022-07-27 MED ORDER — TRANEXAMIC ACID-NACL 1000-0.7 MG/100ML-% IV SOLN
1000.0000 mg | INTRAVENOUS | Status: AC
  Administered 2022-07-27: 1000 mg via INTRAVENOUS
  Filled 2022-07-27: qty 100

## 2022-07-27 MED ORDER — FAMOTIDINE 20 MG PO TABS
40.0000 mg | ORAL_TABLET | Freq: Every day | ORAL | Status: DC
  Administered 2022-07-27: 40 mg via ORAL
  Filled 2022-07-27: qty 2

## 2022-07-27 MED ORDER — CEFAZOLIN SODIUM-DEXTROSE 2-4 GM/100ML-% IV SOLN
2.0000 g | INTRAVENOUS | Status: AC
  Administered 2022-07-27: 2 g via INTRAVENOUS
  Filled 2022-07-27: qty 100

## 2022-07-27 MED ORDER — FENTANYL CITRATE (PF) 250 MCG/5ML IJ SOLN
INTRAMUSCULAR | Status: DC | PRN
  Administered 2022-07-27 (×2): 50 ug via INTRAVENOUS

## 2022-07-27 MED ORDER — BUPIVACAINE LIPOSOME 1.3 % IJ SUSP
INTRAMUSCULAR | Status: DC | PRN
  Administered 2022-07-27: 20 mL

## 2022-07-27 MED ORDER — ROPIVACAINE HCL 7.5 MG/ML IJ SOLN
INTRAMUSCULAR | Status: DC | PRN
  Administered 2022-07-27: 20 mL via PERINEURAL

## 2022-07-27 MED ORDER — HYDROMORPHONE HCL 1 MG/ML IJ SOLN: 0.2500 mg | INTRAMUSCULAR | Status: DC | PRN

## 2022-07-27 MED ORDER — SODIUM CHLORIDE 0.9 % IV SOLN: INTRAVENOUS | Status: DC

## 2022-07-27 MED ORDER — ONDANSETRON HCL 4 MG PO TABS: 4.0000 mg | ORAL_TABLET | Freq: Four times a day (QID) | ORAL | Status: DC | PRN

## 2022-07-27 MED ORDER — BUPIVACAINE HCL (PF) 0.25 % IJ SOLN
INTRAMUSCULAR | Status: AC
  Filled 2022-07-27: qty 30

## 2022-07-27 MED ORDER — ALBUTEROL SULFATE HFA 108 (90 BASE) MCG/ACT IN AERS: 1.0000 | INHALATION_SPRAY | Freq: Four times a day (QID) | RESPIRATORY_TRACT | Status: DC | PRN

## 2022-07-27 MED ORDER — AMLODIPINE BESYLATE 2.5 MG PO TABS
2.5000 mg | ORAL_TABLET | Freq: Two times a day (BID) | ORAL | Status: DC
  Administered 2022-07-27 – 2022-07-28 (×2): 2.5 mg via ORAL
  Filled 2022-07-27 (×2): qty 1

## 2022-07-27 MED ORDER — OXYCODONE HCL 5 MG/5ML PO SOLN: 5.0000 mg | Freq: Once | ORAL | Status: DC | PRN

## 2022-07-27 MED ORDER — MENTHOL 3 MG MT LOZG: 1.0000 | LOZENGE | OROMUCOSAL | Status: DC | PRN

## 2022-07-27 MED ORDER — ATORVASTATIN CALCIUM 10 MG PO TABS
20.0000 mg | ORAL_TABLET | Freq: Every day | ORAL | Status: DC
  Administered 2022-07-28: 20 mg via ORAL
  Filled 2022-07-27: qty 2

## 2022-07-27 MED ORDER — LACTATED RINGERS IV SOLN: INTRAVENOUS | Status: DC

## 2022-07-27 MED ORDER — METOCLOPRAMIDE HCL 5 MG/ML IJ SOLN
5.0000 mg | Freq: Three times a day (TID) | INTRAMUSCULAR | Status: DC | PRN
  Administered 2022-07-28: 10 mg via INTRAVENOUS
  Filled 2022-07-27: qty 2

## 2022-07-27 MED ORDER — BUPIVACAINE LIPOSOME 1.3 % IJ SUSP
INTRAMUSCULAR | Status: AC
  Filled 2022-07-27: qty 20

## 2022-07-27 MED ORDER — METHOCARBAMOL 500 MG PO TABS
500.0000 mg | ORAL_TABLET | Freq: Four times a day (QID) | ORAL | Status: DC | PRN
  Administered 2022-07-28: 500 mg via ORAL
  Filled 2022-07-27: qty 1

## 2022-07-27 MED ORDER — SERTRALINE HCL 100 MG PO TABS
100.0000 mg | ORAL_TABLET | Freq: Every day | ORAL | Status: DC
  Administered 2022-07-27: 100 mg via ORAL
  Filled 2022-07-27: qty 1

## 2022-07-27 MED ORDER — LOSARTAN POTASSIUM 50 MG PO TABS
100.0000 mg | ORAL_TABLET | Freq: Every day | ORAL | Status: DC
  Administered 2022-07-28: 100 mg via ORAL
  Filled 2022-07-27: qty 2

## 2022-07-27 MED ORDER — ACETAMINOPHEN 500 MG PO TABS
1000.0000 mg | ORAL_TABLET | Freq: Four times a day (QID) | ORAL | Status: AC
  Administered 2022-07-27 – 2022-07-28 (×4): 1000 mg via ORAL
  Filled 2022-07-27 (×4): qty 2

## 2022-07-27 MED ORDER — METOCLOPRAMIDE HCL 5 MG PO TABS: 5.0000 mg | ORAL_TABLET | Freq: Three times a day (TID) | ORAL | Status: DC | PRN

## 2022-07-27 MED ORDER — PROPOFOL 500 MG/50ML IV EMUL
INTRAVENOUS | Status: DC | PRN
  Administered 2022-07-27: 80 ug/kg/min via INTRAVENOUS

## 2022-07-27 MED ORDER — POLYVINYL ALCOHOL 1.4 % OP SOLN: Freq: Every day | OPHTHALMIC | Status: DC | PRN

## 2022-07-27 MED ORDER — CHLORHEXIDINE GLUCONATE 0.12 % MT SOLN: 15.0000 mL | Freq: Once | OROMUCOSAL | Status: AC

## 2022-07-27 MED ORDER — FENTANYL CITRATE (PF) 250 MCG/5ML IJ SOLN
INTRAMUSCULAR | Status: AC
  Filled 2022-07-27: qty 5

## 2022-07-27 SURGICAL SUPPLY — 78 items
ATTUNE PS FEM RT SZ 5 CEM KNEE (Femur) IMPLANT
ATTUNE PSRP INSR SZ5 5 KNEE (Insert) IMPLANT
BAG COUNTER SPONGE SURGICOUNT (BAG) ×2 IMPLANT
BAG SPNG CNTER NS LX DISP (BAG) ×1
BANDAGE ESMARK 6X9 LF (GAUZE/BANDAGES/DRESSINGS) ×2 IMPLANT
BASE TIBIAL ROT PLAT SZ 5 KNEE (Knees) IMPLANT
BLADE SAGITTAL 25.0X1.19X90 (BLADE) ×2 IMPLANT
BLADE SAW SGTL 13X75X1.27 (BLADE) ×2 IMPLANT
BNDG CMPR 9X6 STRL LF SNTH (GAUZE/BANDAGES/DRESSINGS) ×1
BNDG CMPR MED 10X6 ELC LF (GAUZE/BANDAGES/DRESSINGS) ×1
BNDG CMPR STD VLCR NS LF 5.8X4 (GAUZE/BANDAGES/DRESSINGS)
BNDG ELASTIC 4X5.8 VLCR NS LF (GAUZE/BANDAGES/DRESSINGS) ×2 IMPLANT
BNDG ELASTIC 4X5.8 VLCR STR LF (GAUZE/BANDAGES/DRESSINGS) ×2 IMPLANT
BNDG ELASTIC 6X10 VLCR STRL LF (GAUZE/BANDAGES/DRESSINGS) ×2 IMPLANT
BNDG ESMARK 6X9 LF (GAUZE/BANDAGES/DRESSINGS) ×1
BOWL SMART MIX CTS (DISPOSABLE) ×2 IMPLANT
BSPLAT TIB 5 CMNT ROT PLAT STR (Knees) ×1 IMPLANT
CEMENT HV SMART SET (Cement) ×4 IMPLANT
COOLER ICEMAN CLASSIC (MISCELLANEOUS) IMPLANT
COVER SURGICAL LIGHT HANDLE (MISCELLANEOUS) ×2 IMPLANT
CUFF TOURN SGL QUICK 34 (TOURNIQUET CUFF) ×1
CUFF TOURN SGL QUICK 42 (TOURNIQUET CUFF) IMPLANT
CUFF TRNQT CYL 34X4.125X (TOURNIQUET CUFF) ×2 IMPLANT
DRAPE ORTHO SPLIT 77X108 STRL (DRAPES) ×2
DRAPE SURG ORHT 6 SPLT 77X108 (DRAPES) ×4 IMPLANT
DRAPE U-SHAPE 47X51 STRL (DRAPES) ×2 IMPLANT
DRSG ADAPTIC 3X8 NADH LF (GAUZE/BANDAGES/DRESSINGS) IMPLANT
DURAPREP 26ML APPLICATOR (WOUND CARE) ×4 IMPLANT
ELECT REM PT RETURN 9FT ADLT (ELECTROSURGICAL) ×1
ELECTRODE REM PT RTRN 9FT ADLT (ELECTROSURGICAL) ×2 IMPLANT
FACESHIELD WRAPAROUND (MASK) ×1 IMPLANT
FACESHIELD WRAPAROUND OR TEAM (MASK) ×4 IMPLANT
GAUZE PAD ABD 8X10 STRL (GAUZE/BANDAGES/DRESSINGS) ×2 IMPLANT
GAUZE SPONGE 4X4 12PLY STRL (GAUZE/BANDAGES/DRESSINGS) IMPLANT
GAUZE XEROFORM 5X9 LF (GAUZE/BANDAGES/DRESSINGS) ×2 IMPLANT
GLOVE BIOGEL PI IND STRL 8 (GLOVE) ×4 IMPLANT
GLOVE ORTHO TXT STRL SZ7.5 (GLOVE) ×4 IMPLANT
GOWN STRL REUS W/ TWL LRG LVL3 (GOWN DISPOSABLE) ×2 IMPLANT
GOWN STRL REUS W/ TWL XL LVL3 (GOWN DISPOSABLE) ×2 IMPLANT
GOWN STRL REUS W/TWL 2XL LVL3 (GOWN DISPOSABLE) ×2 IMPLANT
GOWN STRL REUS W/TWL LRG LVL3 (GOWN DISPOSABLE) ×2
GOWN STRL REUS W/TWL XL LVL3 (GOWN DISPOSABLE) ×1
HANDPIECE INTERPULSE COAX TIP (DISPOSABLE) ×1
IMMOBILIZER KNEE 22 UNIV (SOFTGOODS) ×2 IMPLANT
KIT BASIN OR (CUSTOM PROCEDURE TRAY) ×2 IMPLANT
KIT TURNOVER KIT B (KITS) ×2 IMPLANT
MANIFOLD NEPTUNE II (INSTRUMENTS) ×2 IMPLANT
MARKER SKIN DUAL TIP RULER LAB (MISCELLANEOUS) ×2 IMPLANT
NDL 18GX1X1/2 (RX/OR ONLY) (NEEDLE) ×2 IMPLANT
NDL HYPO 25GX1X1/2 BEV (NEEDLE) ×2 IMPLANT
NEEDLE 18GX1X1/2 (RX/OR ONLY) (NEEDLE) ×1 IMPLANT
NEEDLE HYPO 25GX1X1/2 BEV (NEEDLE) IMPLANT
NS IRRIG 1000ML POUR BTL (IV SOLUTION) ×2 IMPLANT
PACK TOTAL JOINT (CUSTOM PROCEDURE TRAY) ×2 IMPLANT
PAD ARMBOARD 7.5X6 YLW CONV (MISCELLANEOUS) ×4 IMPLANT
PAD COLD SHLDR WRAP-ON (PAD) IMPLANT
PADDING CAST COTTON 6X4 STRL (CAST SUPPLIES) ×2 IMPLANT
PATELLA MEDIAL ATTUN 35MM KNEE (Knees) IMPLANT
PIN STEINMAN FIXATION KNEE (PIN) IMPLANT
SET HNDPC FAN SPRY TIP SCT (DISPOSABLE) ×2 IMPLANT
STAPLER VISISTAT 35W (STAPLE) IMPLANT
SUCTION FRAZIER HANDLE 10FR (MISCELLANEOUS) ×1
SUCTION TUBE FRAZIER 10FR DISP (MISCELLANEOUS) ×2 IMPLANT
SUT VIC AB 0 CT1 27 (SUTURE) ×1
SUT VIC AB 0 CT1 27XBRD ANBCTR (SUTURE) ×2 IMPLANT
SUT VIC AB 0 CT1 36 (SUTURE) IMPLANT
SUT VIC AB 1 CTX 36 (SUTURE) ×1
SUT VIC AB 1 CTX36XBRD ANBCTR (SUTURE) ×4 IMPLANT
SUT VIC AB 2-0 CT1 27 (SUTURE) ×2
SUT VIC AB 2-0 CT1 TAPERPNT 27 (SUTURE) ×4 IMPLANT
SUT VIC AB 2-0 SH 27 (SUTURE) ×1
SUT VIC AB 2-0 SH 27X BRD (SUTURE) IMPLANT
SYR 50ML LL SCALE MARK (SYRINGE) ×2 IMPLANT
SYR CONTROL 10ML LL (SYRINGE) ×2 IMPLANT
TIBIAL BASE ROT PLAT SZ 5 KNEE (Knees) ×1 IMPLANT
TOWEL GREEN STERILE (TOWEL DISPOSABLE) ×2 IMPLANT
TOWEL GREEN STERILE FF (TOWEL DISPOSABLE) ×2 IMPLANT
TRAY CATH INTERMITTENT SS 16FR (CATHETERS) IMPLANT

## 2022-07-27 NOTE — Interval H&P Note (Signed)
History and Physical Interval Note:  07/27/2022 11:50 AM  Debra Sanchez  has presented today for surgery, with the diagnosis of right knee osteoarthritis.  The various methods of treatment have been discussed with the patient and family. After consideration of risks, benefits and other options for treatment, the patient has consented to  Procedure(s) with comments: RIGHT TOTAL KNEE ARTHROPLASTY (Right) - Needs RNFA as a surgical intervention.  The patient's history has been reviewed, patient examined, no change in status, stable for surgery.  I have reviewed the patient's chart and labs.  Questions were answered to the patient's satisfaction.     [name]

## 2022-07-27 NOTE — Anesthesia Procedure Notes (Signed)
Anesthesia Regional Block: Adductor canal block   Pre-Anesthetic Checklist: , timeout performed,  Correct Patient, Correct Site, Correct Laterality,  Correct Procedure, Correct Position, site marked,  Risks and benefits discussed,  Surgical consent,  Pre-op evaluation,  At surgeon's request and post-op pain management  Laterality: Right  Prep: chloraprep       Needles:  Injection technique: Single-shot  Needle Type: Echogenic Needle     Needle Length: 9cm      Additional Needles:   Procedures:,,,, ultrasound used (permanent image in chart),,    Narrative:  Start time: 07/27/2022 11:51 AM End time: 07/27/2022 12:00 PM Injection made incrementally with aspirations every 5 mL.  Performed by: Personally  Anesthesiologist: Eilene Ghazi, MD  Additional Notes: Patient tolerated the procedure well without complications

## 2022-07-27 NOTE — Anesthesia Procedure Notes (Signed)
Anesthesia Procedure Image    

## 2022-07-27 NOTE — Anesthesia Procedure Notes (Signed)
Spinal  Patient location during procedure: OR Start time: 07/27/2022 12:30 PM End time: 07/27/2022 12:35 PM Reason for block: surgical anesthesia Staffing Performed: anesthesiologist  Anesthesiologist: Eilene Ghazi, MD Performed by: Eilene Ghazi, MD Authorized by: Eilene Ghazi, MD   Preanesthetic Checklist Completed: patient identified, IV checked, site marked, risks and benefits discussed, surgical consent, monitors and equipment checked, pre-op evaluation and timeout performed Spinal Block Patient position: sitting Prep: Betadine Patient monitoring: heart rate, continuous pulse ox and blood pressure Approach: midline Location: L3-4 Injection technique: single-shot Needle Needle type: Sprotte  Needle gauge: 24 G Needle length: 9 cm Assessment Sensory level: T6 Events: CSF return Additional Notes

## 2022-07-27 NOTE — Anesthesia Procedure Notes (Signed)
Procedure Name: MAC Date/Time: 07/27/2022 12:30 PM  Performed by: Zollie Beckers, CRNAPre-anesthesia Checklist: Patient identified, Emergency Drugs available, Suction available and Patient being monitored Patient Re-evaluated:Patient Re-evaluated prior to induction Oxygen Delivery Method: Nasal cannula Induction Type: IV induction Placement Confirmation: positive ETCO2

## 2022-07-27 NOTE — Anesthesia Postprocedure Evaluation (Signed)
Anesthesia Post Note  Patient: Debra Sanchez  Procedure(s) Performed: RIGHT TOTAL KNEE ARTHROPLASTY (Right: Knee)     Patient location during evaluation: PACU Anesthesia Type: Spinal Level of consciousness: oriented and awake and alert Pain management: pain level controlled Vital Signs Assessment: post-procedure vital signs reviewed and stable Respiratory status: spontaneous breathing, respiratory function stable and patient connected to nasal cannula oxygen Cardiovascular status: blood pressure returned to baseline and stable Postop Assessment: no headache, no backache and no apparent nausea or vomiting Anesthetic complications: no  No notable events documented.  Last Vitals:  Vitals:   07/27/22 1500 07/27/22 1515  BP: (!) 144/85 (!) 167/84  Pulse: 64 67  Resp: 12 15  Temp:  36.7 C  SpO2: 94% 96%    Last Pain:  Vitals:   07/27/22 1445  TempSrc:   PainSc: Asleep                 Wafa Martes S

## 2022-07-27 NOTE — Transfer of Care (Signed)
Immediate Anesthesia Transfer of Care Note  Patient: Debra Sanchez  Procedure(s) Performed: RIGHT TOTAL KNEE ARTHROPLASTY (Right: Knee)  Patient Location: PACU  Anesthesia Type:MAC, Regional, and Spinal  Level of Consciousness: awake and alert   Airway & Oxygen Therapy: Patient Spontanous Breathing  Post-op Assessment: Report given to RN and Post -op Vital signs reviewed and stable  Post vital signs: Reviewed and stable  Last Vitals:  Vitals Value Taken Time  BP 174/94 07/27/22 1439  Temp    Pulse 60 07/27/22 1444  Resp 7 07/27/22 1444  SpO2 91 % 07/27/22 1444  Vitals shown include unvalidated device data.  Last Pain:  Vitals:   07/27/22 1125  TempSrc: Oral  PainSc: 0-No pain         Complications: No notable events documented.

## 2022-07-27 NOTE — Op Note (Signed)
Preop diagnosis: Right knee primary osteoarthritis  Postop diagnosis: Same  Procedure: Right total knee arthroplasty, cemented.  Surgeon: Jmya Uliano MD  Assistant: YoulaAnnell Greenings, RNFA Surgeon  Anesthesia: Preoperative block plus spinal anesthesia plus Marcaine plus Exparel 20+20.  Implants:Implants  CEMENT HV SMART SET - [id number]  Inventory Item: CEMENT HV SMART SET Serial no.: Model/Cat no.: [id number]  Implant name: CEMENT HV SMART SET - [id number] Laterality: Right Area: Knee  Manufacturer: [employer] Date of Manufacture:   Action: Implanted Number Used: 1   Device Identifier: Device Identifier Type:   CEMENT HV SMART SET - [id number]  Inventory Item: CEMENT HV SMART SET Serial no.: Model/Cat no.: [id number]  Implant name: CEMENT HV SMART SET - [id number] Laterality: Right Area: Knee  Manufacturer: [employer] Date of Manufacture:   Action: Implanted Number Used: 1   Device Identifier: Device Identifier Type:   PATELLA MEDIAL ATTUN KNEE - [id number]  Inventory Item: PATELLA MEDIAL ATTUN KNEE Serial no.: Model/Cat no.: [id number]  Implant name: PATELLA MEDIAL ATTUN KNEE - [id number] Laterality: Right Area: Knee  Manufacturer: [employer] Date of Manufacture:   Action: Implanted Number Used: 1   Device Identifier: Device Identifier Type:   ATTUNE PS FEM RT SZ 5 CEM KNEE - [id number]  Inventory Item: ATTUNE PS FEM RT SZ 5 CEM KNEE Serial no.: Model/Cat no.: [id number]  Implant name: ATTUNE PS FEM RT SZ 5 CEM KNEE - [id number] Laterality: Right Area: Knee  Manufacturer: [employer] Date of Manufacture:   Action: Implanted Number Used: 1   Device Identifier: Device Identifier Type:   ATTUNE PSRP INSR SZ5 5 KNEE - [id number]  Inventory Item: ATTUNE PSRP INSR SZ5 5 KNEE Serial no.: Model/Cat no.: [id number]  Implant name: ATTUNE PSRP INSR SZ5 5 KNEE - [id number] Laterality: Right Area: Knee  Manufacturer: [employer] Date of  Manufacture:   Action: Implanted Number Used: 1   Device Identifier: Device Identifier Type:   TIBIAL BASE ROT PLAT SZ 5 KNEE - [id number]   Inventory Item: TIBIAL BASE ROT PLAT SZ 5 KNEE Serial no.: Model/Cat no.: [id number]  Implant name: TIBIAL BASE ROT PLAT SZ 5 KNEE - [id number] Laterality: Right Area: Knee  Manufacturer: [employer] Date of Manufacture:   Action: Implanted Number Used: 1   Device Identifier: Device Identifier Type:   After induction of a spinal anesthesia standard prepping draping lateral post heel bump DuraPrep usual extremity sheets and draped sterile skin marker Betadine Steri-Drape timeout procedure was completed.  Ancef prophylaxis IV TXA.  Legs wrapped in Esmarch tourniquet inflated.  Midline incision was made medial parapatellar incision.  Patella was very thin and had had about 50% of the anterior posterior depth of the patella worn off with eburnated bone.  Sliver was taken points to left 10 mm of thickness for the patella.  Intramedullary hole drilled in the femur sized for the femur was a #5 chamfer cuts box cut was made.  Tibia Distal cut was 10 mm and size 5 for the tibia.  He will preparation leg note not holes were drilled as well as in the patella.  Vacuum mixed and cement pulsatile lavage preparation of the bone.  No posterior osteophytes need to be resected all meniscal remnants were resected.  Patient had tricompartmental degenerative arthritis much more severe patellofemoral joint but eburnated bone medial and lateral compartments as well.  Tibia cemented first followed by femur placement of permanent poly 5 mm rotating platform based on spacer measurements after distal cut on  the femur and proximal cut on the tibia been made.  There was good stability collateral ligaments balance.  Patella was held with a patellar clamp on the cement set up.  Exparel Marcaine was made with cement was setting up cement was hardened 15 minutes.  Repeat pulse followed by  tourniquet deflation of the tourniquet hemostasis Bovie electrocautery and standard layered closure with skin staples.  #1 Vicryl on the quad tendon and medial retinacular incision.  2-0 Vicryl subcutaneous tissue.  Postop dressing knee immobilizer was applied.

## 2022-07-27 NOTE — Interval H&P Note (Signed)
History and Physical Interval Note:  07/27/2022 11:51 AM  Debra Sanchez  has presented today for surgery, with the diagnosis of right knee osteoarthritis.  The various methods of treatment have been discussed with the patient and family. After consideration of risks, benefits and other options for treatment, the patient has consented to  Procedure(s) with comments: RIGHT TOTAL KNEE ARTHROPLASTY (Right) - Needs RNFA as a surgical intervention.  The patient's history has been reviewed, patient examined, no change in status, stable for surgery.  I have reviewed the patient's chart and labs.  Questions were answered to the patient's satisfaction.     [name]

## 2022-07-27 NOTE — Evaluation (Signed)
Physical Therapy Evaluation Patient Details Name: Debra Sanchez MRN: [id number] DOB: 09/19/1940 Today's Date: 07/27/2022  History of Present Illness  82 y.o. female presents to [facility] hospital on 07/27/2022 for elective R TKA. PMH includes R temporal meningioma, hiatal hernia, HTN, GERD, anxiety, migraines, PNA.  Clinical Impression  Pt presents to PT with deficits in strength, power, endurance, gait, balance, functional mobility. Pt is able to ambulate for short distances at this time, limited by pain and reports of dizziness near completion of ambulation. BP stable once in supine, 145/72. PT provides education on HEP, knee immobilizer and polar care machine. PT will follow up tomorrow for further gait and mobility training.       Recommendations for follow up therapy are one component of a multi-disciplinary discharge planning process, led by the attending physician.  Recommendations may be updated based on patient status, additional functional criteria and insurance authorization.  Follow Up Recommendations       Assistance Recommended at Discharge PRN  Patient can return home with the following  A little help with bathing/dressing/bathroom;Assistance with cooking/housework;Help with stairs or ramp for entrance;Assist for transportation    Equipment Recommendations Rolling walker (2 wheels)  Recommendations for Other Services       Functional Status Assessment Patient has had a recent decline in their functional status and demonstrates the ability to make significant improvements in function in a reasonable and predictable amount of time.     Precautions / Restrictions Precautions Precautions: Fall;Knee Precaution Booklet Issued: Yes (comment) Required Braces or Orthoses: Knee Immobilizer - Right Knee Immobilizer - Right: Other (comment) (may remove when pt demonstrates good quad control. Pt able to perform SLR and short arc quad this session, mobilizes well without buckling  noted) Restrictions Weight Bearing Restrictions: Yes RLE Weight Bearing: Weight bearing as tolerated      Mobility  Bed Mobility Overal bed mobility: Needs Assistance Bed Mobility: Supine to Sit, Sit to Supine     Supine to sit: Supervision Sit to supine: Min guard        Transfers Overall transfer level: Needs assistance Equipment used: Rolling walker (2 wheels) Transfers: Sit to/from Stand Sit to Stand: Min guard                Ambulation/Gait Ambulation/Gait assistance: Min guard Gait Distance (Feet): 20 Feet Assistive device: Rolling walker (2 wheels) Gait Pattern/deviations: Step-through pattern Gait velocity: reduced Gait velocity interpretation: <1.31 ft/sec, indicative of household ambulator   General Gait Details: slowed step-through gait, reduced stance time on RLE  Stairs            Wheelchair Mobility    Modified Rankin (Stroke Patients Only)       Balance Overall balance assessment: Needs assistance Sitting-balance support: No upper extremity supported, Feet supported Sitting balance-Leahy Scale: Good     Standing balance support: Bilateral upper extremity supported, Reliant on assistive device for balance Standing balance-Leahy Scale: Poor                               Pertinent Vitals/Pain Pain Assessment Pain Assessment: 0-10 Pain Score: 7  Pain Location: R knee Pain Descriptors / Indicators: Aching Pain Intervention(s): Premedicated before session    Home Living Family/patient expects to be discharged to:: Private residence Living Arrangements: Spouse/significant other Available Help at Discharge: Family;Available 24 hours/day Type of Home: House Home Access: Stairs to enter Entrance Stairs-Rails: Can reach both Entrance Stairs-Number of Steps: 6  Home Layout: One level Home Equipment: None      Prior Function Prior Level of Function : Independent/Modified Independent;Driving                      Hand Dominance        Extremity/Trunk Assessment   Upper Extremity Assessment Upper Extremity Assessment: Overall WFL for tasks assessed    Lower Extremity Assessment Lower Extremity Assessment: RLE deficits/detail RLE Deficits / Details: post-op knee ROM and strength deficits as expected post-TKA. Pt is able to perform SLR without lag    Cervical / Trunk Assessment Cervical / Trunk Assessment: Normal  Communication   Communication: No difficulties  Cognition Arousal/Alertness: Awake/alert Behavior During Therapy: WFL for tasks assessed/performed Overall Cognitive Status: Within Functional Limits for tasks assessed                                          General Comments General comments (skin integrity, edema, etc.): VSS on RA. Education provided on use of KI when sleeping and ice machine    Exercises Other Exercises Other Exercises: PT provides education on TKR exercise packet   Assessment/Plan    PT Assessment Patient needs continued PT services  PT Problem List Decreased strength;Decreased range of motion;Decreased activity tolerance;Decreased balance;Decreased mobility;Decreased knowledge of use of DME;Pain       PT Treatment Interventions DME instruction;Stair training;Gait training;Therapeutic activities;Functional mobility training;Therapeutic exercise;Balance training;Neuromuscular re-education;Patient/family education    PT Goals (Current goals can be found in the Care Plan section)  Acute Rehab PT Goals Patient Stated Goal: to return to independence PT Goal Formulation: With patient/family Time For Goal Achievement: 07/31/22 Potential to Achieve Goals: Good    Frequency 7X/week     Co-evaluation               AM-PAC PT "6 Clicks" Mobility  Outcome Measure Help needed turning from your back to your side while in a flat bed without using bedrails?: A Little Help needed moving from lying on your back to sitting on the  side of a flat bed without using bedrails?: A Little Help needed moving to and from a bed to a chair (including a wheelchair)?: A Little Help needed standing up from a chair using your arms (e.g., wheelchair or bedside chair)?: A Little Help needed to walk in hospital room?: A Little Help needed climbing 3-5 steps with a railing? : Total 6 Click Score: 16    End of Session Equipment Utilized During Treatment: Gait belt Activity Tolerance: Patient tolerated treatment well Patient left: in bed;with call bell/phone within reach;with family/visitor present Nurse Communication: Mobility status PT Visit Diagnosis: Other abnormalities of gait and mobility (R26.89);Muscle weakness (generalized) (M62.81);Pain Pain - Right/Left: Right Pain - part of body: Knee    Time: 7829-5621 PT Time Calculation (min) (ACUTE ONLY): 30 min   Charges:   PT Evaluation $PT Eval Low Complexity: 1 Low          Arlyss Gandy, PT, DPT Acute Rehabilitation Office [phone]   Arlyss Gandy 07/27/2022, 5:26 PM

## 2022-07-28 ENCOUNTER — Encounter (HOSPITAL_COMMUNITY): Payer: Self-pay | Admitting: Orthopaedic Surgery

## 2022-07-28 DIAGNOSIS — E039 Hypothyroidism, unspecified: Secondary | ICD-10-CM | POA: Diagnosis not present

## 2022-07-28 DIAGNOSIS — I10 Essential (primary) hypertension: Secondary | ICD-10-CM | POA: Diagnosis not present

## 2022-07-28 DIAGNOSIS — M1711 Unilateral primary osteoarthritis, right knee: Secondary | ICD-10-CM | POA: Diagnosis not present

## 2022-07-28 DIAGNOSIS — Z8616 Personal history of COVID-19: Secondary | ICD-10-CM | POA: Diagnosis not present

## 2022-07-28 LAB — BASIC METABOLIC PANEL
Anion gap: 6 (ref 5–15)
BUN: 14 mg/dL (ref 8–23)
CO2: 26 mmol/L (ref 22–32)
Calcium: 8.5 mg/dL — ABNORMAL LOW (ref 8.9–10.3)
Chloride: 104 mmol/L (ref 98–111)
Creatinine, Ser: 0.86 mg/dL (ref 0.44–1.00)
GFR, Estimated: >60 mL/min (ref >60)
Glucose, Bld: 117 mg/dL — ABNORMAL HIGH (ref 70–99)
Potassium: 4 mmol/L (ref 3.5–5.1)
Sodium: 136 mmol/L (ref 135–145)

## 2022-07-28 LAB — CBC
HCT: 30.9 % — ABNORMAL LOW (ref 36.0–46.0)
Hemoglobin: 10.4 g/dL — ABNORMAL LOW (ref 12.0–15.0)
MCH: 31.6 pg (ref 26.0–34.0)
MCHC: 33.7 g/dL (ref 30.0–36.0)
MCV: 93.9 fL (ref 80.0–100.0)
Platelets: 139 10*3/uL — ABNORMAL LOW (ref 150–400)
RBC: 3.29 MIL/uL — ABNORMAL LOW (ref 3.87–5.11)
RDW: 13.2 % (ref 11.5–15.5)
WBC: 6.9 10*3/uL (ref 4.0–10.5)
nRBC: 0 % (ref 0.0–0.2)

## 2022-07-28 MED ORDER — METHOCARBAMOL 500 MG PO TABS: 500.0000 mg | ORAL_TABLET | Freq: Four times a day (QID) | ORAL | 1 refills | Status: DC | PRN

## 2022-07-28 MED ORDER — ASPIRIN 81 MG PO TBEC: 81.0000 mg | DELAYED_RELEASE_TABLET | Freq: Every day | ORAL | 0 refills | Status: DC

## 2022-07-28 MED ORDER — OXYCODONE-ACETAMINOPHEN 5-325 MG PO TABS: 1.0000 | ORAL_TABLET | Freq: Four times a day (QID) | ORAL | 0 refills | Status: DC | PRN

## 2022-07-28 MED ORDER — ENOXAPARIN SODIUM 30 MG/0.3ML IJ SOSY
30.0000 mg | PREFILLED_SYRINGE | INTRAMUSCULAR | Status: DC
  Administered 2022-07-28: 30 mg via SUBCUTANEOUS
  Filled 2022-07-28: qty 0.3

## 2022-07-28 MED ORDER — KETOROLAC TROMETHAMINE 30 MG/ML IJ SOLN
30.0000 mg | Freq: Once | INTRAMUSCULAR | Status: AC
  Administered 2022-07-28: 30 mg via INTRAVENOUS
  Filled 2022-07-28: qty 1

## 2022-07-28 MED ORDER — ENOXAPARIN SODIUM 40 MG/0.4ML IJ SOSY: 40.0000 mg | PREFILLED_SYRINGE | INTRAMUSCULAR | Status: DC

## 2022-07-28 NOTE — Plan of Care (Addendum)
AVS reviewed with patient.  Patient excited for discharge All questions answered

## 2022-07-28 NOTE — Progress Notes (Signed)
Physical Therapy Treatment Patient Details Name: Debra Sanchez MRN: [id number] DOB: 13-Aug-1940 Today's Date: 07/28/2022   History of Present Illness 82 y.o. female presents to [facility] hospital on 07/27/2022 for elective R TKA. PMH includes R temporal meningioma, hiatal hernia, HTN, GERD, anxiety, migraines, PNA.    PT Comments    Pt was received in supine and agreeable to session with family present. Pt reporting poor sleep and nausea/vomiting overnight. Pt reports vomiting pain medication, so she is limited by increased R knee pain. Pt able to tolerate short gait distance in room, however reporting increased nausea and sweating which limits gait distance. Pt demonstrating good knee ROM with LAQ and knee flexion at EOB. Pt demonstrates no overt R knee buckling or instability during ambulation. Plan to progress gait and stair trial as able this pm. Pt continues to benefit from PT services to progress toward functional mobility goals.     Recommendations for follow up therapy are one component of a multi-disciplinary discharge planning process, led by the attending physician.  Recommendations may be updated based on patient status, additional functional criteria and insurance authorization.     Assistance Recommended at Discharge PRN  Patient can return home with the following A little help with bathing/dressing/bathroom;Assistance with cooking/housework;Help with stairs or ramp for entrance;Assist for transportation   Equipment Recommendations  Rolling walker (2 wheels)    Recommendations for Other Services       Precautions / Restrictions Precautions Precautions: Fall;Knee Precaution Booklet Issued: Yes (comment) Required Braces or Orthoses: Knee Immobilizer - Right Knee Immobilizer - Right: Other (comment) Restrictions Weight Bearing Restrictions: Yes RLE Weight Bearing: Weight bearing as tolerated     Mobility  Bed Mobility Overal bed mobility: Needs Assistance Bed Mobility:  Supine to Sit     Supine to sit: Supervision     General bed mobility comments: Pt able to sit EOB without use of bedrail or assist. Supervision for safety    Transfers Overall transfer level: Needs assistance Equipment used: Rolling walker (2 wheels) Transfers: Sit to/from Stand Sit to Stand: Min guard           General transfer comment: Cues for hand placement and placing RLE forward to reduce pain.    Ambulation/Gait Ambulation/Gait assistance: Min guard Gait Distance (Feet): 20 Feet Assistive device: Rolling walker (2 wheels) Gait Pattern/deviations: Step-to pattern, Antalgic, Trunk flexed, Decreased stance time - right, Decreased step length - left Gait velocity: reduced     General Gait Details: slow, antalgic, step-to pattern due to increased RLE pain. Heavy reliance on BUE support.      Balance Overall balance assessment: Needs assistance Sitting-balance support: No upper extremity supported, Feet supported Sitting balance-Leahy Scale: Good Sitting balance - Comments: sitting EOB   Standing balance support: Bilateral upper extremity supported, Reliant on assistive device for balance, During functional activity Standing balance-Leahy Scale: Poor Standing balance comment: with RW support                            Cognition Arousal/Alertness: Awake/alert Behavior During Therapy: WFL for tasks assessed/performed Overall Cognitive Status: Within Functional Limits for tasks assessed                                          Exercises Total Joint Exercises Long Arc Quad: AROM, Seated, Right, 5 reps Knee Flexion: AROM, Seated,  Right, 5 reps    General Comments General comments (skin integrity, edema, etc.): Pt reporting increased nausea and sweating with ambulation, VSS.      Pertinent Vitals/Pain Pain Assessment Pain Assessment: Faces Faces Pain Scale: Hurts whole lot Pain Location: R knee Pain Descriptors / Indicators:  Aching, Guarding, Grimacing Pain Intervention(s): Limited activity within patient's tolerance, Monitored during session, Repositioned     PT Goals (current goals can now be found in the care plan section) Acute Rehab PT Goals Patient Stated Goal: to return to independence PT Goal Formulation: With patient/family Time For Goal Achievement: 07/31/22 Potential to Achieve Goals: Good Progress towards PT goals: Progressing toward goals    Frequency    7X/week      PT Plan Current plan remains appropriate       AM-PAC PT "6 Clicks" Mobility   Outcome Measure  Help needed turning from your back to your side while in a flat bed without using bedrails?: A Little Help needed moving from lying on your back to sitting on the side of a flat bed without using bedrails?: A Little Help needed moving to and from a bed to a chair (including a wheelchair)?: A Little Help needed standing up from a chair using your arms (e.g., wheelchair or bedside chair)?: A Little Help needed to walk in hospital room?: A Little Help needed climbing 3-5 steps with a railing? : A Lot 6 Click Score: 17    End of Session Equipment Utilized During Treatment: Gait belt Activity Tolerance: Patient limited by pain;Other (comment) (nausea) Patient left: in chair;with family/visitor present;with call bell/phone within reach Nurse Communication: Mobility status PT Visit Diagnosis: Other abnormalities of gait and mobility (R26.89);Muscle weakness (generalized) (M62.81);Pain     Time: 1610-9604 PT Time Calculation (min) (ACUTE ONLY): 20 min  Charges:  $Gait Training: 8-22 mins                     Johny Shock, PTA Acute Rehabilitation Services Secure Chat Preferred  [phone]    Johny Shock 07/28/2022, 9:26 AM

## 2022-07-28 NOTE — Progress Notes (Signed)
Physical Therapy Treatment Patient Details Name: Debra Sanchez MRN: [id number] DOB: February 02, 1941 Today's Date: 07/28/2022   History of Present Illness 82 y.o. female presents to [facility] hospital on 07/27/2022 for elective R TKA. PMH includes R temporal meningioma, hiatal hernia, HTN, GERD, anxiety, migraines, PNA.    PT Comments    Pt was received in supine and agreeable to session. Pt was able to increase gait distance and perform stair trial this session with up to min guard for safety. Gait distance was limited by stomach discomfort, however no instability noted in R knee. Pt demonstrating safe stair trial with cues for technique and pt's granddaughter was educated on guarding techniques. Discussed navigating home set up safely. Although gait distance was limited this session, anticipate pt will be able to ambulate household distances. Pt's granddaughter reports that she is able to stay with the pt through the weekend if necessary. Anticipate pt and family will be able to manage pt's mobility needs at home when medically ready for discharge.    Recommendations for follow up therapy are one component of a multi-disciplinary discharge planning process, led by the attending physician.  Recommendations may be updated based on patient status, additional functional criteria and insurance authorization.     Assistance Recommended at Discharge PRN  Patient can return home with the following A little help with bathing/dressing/bathroom;Assistance with cooking/housework;Help with stairs or ramp for entrance;Assist for transportation   Equipment Recommendations  Rolling walker (2 wheels)    Recommendations for Other Services       Precautions / Restrictions Precautions Precautions: Fall;Knee Precaution Booklet Issued: Yes (comment) Required Braces or Orthoses: Knee Immobilizer - Right Knee Immobilizer - Right: Other (comment) Restrictions Weight Bearing Restrictions: Yes RLE Weight Bearing:  Weight bearing as tolerated     Mobility  Bed Mobility Overal bed mobility: Modified Independent Bed Mobility: Supine to Sit     Supine to sit: Supervision     General bed mobility comments: Pt able to sit EOB without use of bedrail or assist. Supervision for safety    Transfers Overall transfer level: Needs assistance Equipment used: Rolling walker (2 wheels) Transfers: Sit to/from Stand Sit to Stand: Supervision           General transfer comment: Cues for hand placement. Pt demonstrating decreased eccentric control that slightly improved with cues.    Ambulation/Gait Ambulation/Gait assistance: Min guard Gait Distance (Feet): 50 Feet Assistive device: Rolling walker (2 wheels) Gait Pattern/deviations: Step-through pattern, Decreased stance time - right Gait velocity: reduced     General Gait Details: slow step-through pattern due to increased RLE pain. Improved step length this session   Stairs Stairs: Yes Stairs assistance: Min guard Stair Management: One rail Right, Sideways Number of Stairs: 6 General stair comments: Cues for sequence and technique with pt able to demo with min guard for safety. Granddaughter educated on guarding techniques.      Balance Overall balance assessment: Needs assistance Sitting-balance support: No upper extremity supported, Feet supported Sitting balance-Leahy Scale: Normal Sitting balance - Comments: sitting EOB   Standing balance support: During functional activity, Reliant on assistive device for balance, Bilateral upper extremity supported Standing balance-Leahy Scale: Fair Standing balance comment: with RW support                            Cognition Arousal/Alertness: Awake/alert Behavior During Therapy: WFL for tasks assessed/performed Overall Cognitive Status: Within Functional Limits for tasks assessed  Exercises Total Joint  Exercises Straight Leg Raises: AROM, Supine, Right, 5 reps Long Arc Quad: AROM, Seated, Right, 5 reps Knee Flexion: AROM, Seated, Right, 5 reps    General Comments General comments (skin integrity, edema, etc.): Pt reporting stomach discomfort with ambulation and requesting to return to bed.      Pertinent Vitals/Pain Pain Assessment Pain Assessment: Faces Faces Pain Scale: Hurts even more Pain Location: R knee Pain Descriptors / Indicators: Aching, Guarding, Grimacing Pain Intervention(s): Limited activity within patient's tolerance, Monitored during session, Ice applied     PT Goals (current goals can now be found in the care plan section) Acute Rehab PT Goals Patient Stated Goal: to return to independence PT Goal Formulation: With patient/family Time For Goal Achievement: 07/31/22 Potential to Achieve Goals: Good Progress towards PT goals: Progressing toward goals    Frequency    7X/week      PT Plan Current plan remains appropriate       AM-PAC PT "6 Clicks" Mobility   Outcome Measure  Help needed turning from your back to your side while in a flat bed without using bedrails?: None Help needed moving from lying on your back to sitting on the side of a flat bed without using bedrails?: None Help needed moving to and from a bed to a chair (including a wheelchair)?: A Little Help needed standing up from a chair using your arms (e.g., wheelchair or bedside chair)?: A Little Help needed to walk in hospital room?: A Little Help needed climbing 3-5 steps with a railing? : A Little 6 Click Score: 20    End of Session Equipment Utilized During Treatment: Gait belt Activity Tolerance: Patient limited by pain;Other (comment) (stomach discomfort) Patient left: in bed;with call bell/phone within reach;with family/visitor present Nurse Communication: Mobility status PT Visit Diagnosis: Other abnormalities of gait and mobility (R26.89);Muscle weakness (generalized)  (M62.81);Pain     Time: 1307-1400 PT Time Calculation (min) (ACUTE ONLY): 53 min  Charges:  $Gait Training: 38-52 mins $Therapeutic Exercise: 8-22 mins                     Johny Shock, PTA Acute Rehabilitation Services Secure Chat Preferred  Office:([phone]    Johny Shock 07/28/2022, 2:13 PM

## 2022-07-28 NOTE — Discharge Instructions (Signed)

## 2022-07-28 NOTE — Progress Notes (Signed)
Orthopedic Tech Progress Note Patient Details:  Debra Sanchez 1940-06-27 [id number]  CPM Right Knee CPM Right Knee: On Right Knee Flexion (Degrees): 60 Right Knee Extension (Degrees): 0  Post Interventions Patient Tolerated: Fair  [place] 07/28/2022, 6:29 AM

## 2022-07-28 NOTE — Progress Notes (Signed)
Patient ID: Debra Sanchez, female   DOB: February 23, 1941, 82 y.o.   MRN: [id number]   Subjective: 1 Day Post-Op Procedure(s) (LRB): RIGHT TOTAL KNEE ARTHROPLASTY (Right) Patient reports pain as moderate.  Patient yesterday had 10 mg oxycodone followed by 3 doses of 15 mg of oxycodone for total of 55 mg.  She has been nauseated.  Has wet washcloth on her head she got lightheaded with therapy but did walk in the room to the door.  Objective: Vital signs in last 24 hours: Temp:  [97.4 F (36.3 C)-98.4 F (36.9 C)] 98 F (36.7 C) (04/18 0759) Pulse Rate:  [60-76] 69 (04/18 0759) Resp:  [11-18] 16 (04/18 0759) BP: (124-174)/(65-94) 127/65 (04/18 0759) SpO2:  [94 %-100 %] 94 % (04/18 0759) Weight:  [85.3 kg] 85.3 kg (04/17 1125)  Intake/Output from previous day: 04/17 0701 - 04/18 0700 In: 1878.1 [I.V.:1823.1; IV Piggyback:55] Out: 1200 [Urine:1150; Blood:50] Intake/Output this shift: Total I/O In: 120 [P.O.:120] Out: -   Recent Labs    07/28/22 0330  HGB 10.4*   Recent Labs    07/28/22 0330  WBC 6.9  RBC 3.29*  HCT 30.9*  PLT 139*   Recent Labs    07/28/22 0330  NA 136  K 4.0  CL 104  CO2 26  BUN 14  CREATININE 0.86  GLUCOSE 117*  CALCIUM 8.5*   No results for input(s): "LABPT", "INR" in the last 72 hours.  Neurologically intact No results found.  Assessment/Plan: 1 Day Post-Op Procedure(s) (LRB): RIGHT TOTAL KNEE ARTHROPLASTY (Right) Up with therapy.  Need to back down on her pain medication.  Order single dose Toradol see if this gives her some improvement in her pain and help decrease narcotics.  Patient did not want the aspirin will give her choice either aspirin or Lovenox but needs some DVT prophylaxis.  [name] 07/28/2022, 10:20 AM

## 2022-07-28 NOTE — Care Plan (Signed)
Ortho Bundle Case Management Note  Patient Details  Name: Debra Sanchez MRN: [id number] Date of Birth: 1940-07-29     Cyndia Skeeters RNCM call to patient to discuss her upcoming Right total knee arthroplasty with Dr. Ophelia Charter on 07/27/22. She is an Ortho bundle patient through [facility] and is agreeable to case management. She has a grand-daughter that will be helping her after discharge at home. She will need a RW and 3in1/BSC prior to discharge from the hospital. Anticipate HHPT will be needed after a short hospital stay. Referral made to [facility] after choice provided. Reviewed post op care instructions. Will continue to follow for needs.                     DME Arranged:  3-N-1, Walker rolling DME Agency:     HH Arranged:  PT HH Agency:  [facility]  Additional Comments: Please contact me with any questions of if this plan should need to change.  Ralph Dowdy, RN, BSN, [employer]  [phone] 07/28/2022, 1:58 PM

## 2022-07-28 NOTE — Evaluation (Signed)
Occupational Therapy Evaluation Patient Details Name: Debra Sanchez MRN: [id number] DOB: 1940/05/10 Today's Date: 07/28/2022   History of Present Illness 82 y.o. female presents to [facility] on 07/27/2022 for elective R TKA. PMH includes R temporal meningioma, hiatal hernia, HTN, GERD, anxiety, migraines, PNA.   Clinical Impression   Pt is s/p R TKA on 07/27/22. Pt displays good overall strength, balance, and mobility with use of RW.  Pt supervision for transfers/ambulation, able to stand at sink and perform ADLs, gather clothes needed for dressing.  Pt lives at home with husband, granddaughter will also be staying with her, able to assist 24/7. Pt would benefit from RW and toilet seat riser with handles to improve functional independence upon DC to home. No continued OT or follow up OT needed.      Recommendations for follow up therapy are one component of a multi-disciplinary discharge planning process, led by the attending physician.  Recommendations may be updated based on patient status, additional functional criteria and insurance authorization.   Assistance Recommended at Discharge Intermittent Supervision/Assistance  Patient can return home with the following A little help with walking and/or transfers;A little help with bathing/dressing/bathroom;Assistance with cooking/housework;Assist for transportation;Help with stairs or ramp for entrance    Functional Status Assessment  Patient has had a recent decline in their functional status and demonstrates the ability to make significant improvements in function in a reasonable and predictable amount of time.  Equipment Recommendations  Toilet rise with handles (RW)    Recommendations for Other Services       Precautions / Restrictions Precautions Precautions: Fall;Knee Precaution Booklet Issued: Yes (comment) Required Braces or Orthoses: Knee Immobilizer - Right Knee Immobilizer - Right: Other (comment) Restrictions Weight  Bearing Restrictions: Yes RLE Weight Bearing: Weight bearing as tolerated      Mobility Bed Mobility Overal bed mobility: Modified Independent             General bed mobility comments: able to perform mod I, no assistance needed    Transfers Overall transfer level: Needs assistance Equipment used: Rolling walker (2 wheels) Transfers: Sit to/from Stand, Bed to chair/wheelchair/BSC Sit to Stand: Supervision                  Balance Overall balance assessment: Needs assistance Sitting-balance support: No upper extremity supported, Feet supported Sitting balance-Leahy Scale: Normal Sitting balance - Comments: sitting EOB performing ADLs   Standing balance support: No upper extremity supported, During functional activity Standing balance-Leahy Scale: Good Standing balance comment: with RW support, able to perform ADLs standing unsupported                           ADL either performed or assessed with clinical judgement   ADL Overall ADL's : Needs assistance/impaired Eating/Feeding: Independent   Grooming: Modified independent   Upper Body Bathing: Modified independent   Lower Body Bathing: Modified independent   Upper Body Dressing : Modified independent   Lower Body Dressing: Modified independent   Toilet Transfer: Supervision/safety   Toileting- Clothing Manipulation and Hygiene: Modified independent   Tub/ Shower Transfer: Supervision/safety   Functional mobility during ADLs: Supervision/safety General ADL Comments: supervision for trasnfers, ableto complete ADL tasks mod I, good safety awareness     Vision Baseline Vision/History: 1 Wears glasses Ability to See in Adequate Light: 0 Adequate Patient Visual Report: No change from baseline       Perception     Praxis  Pertinent Vitals/Pain Pain Assessment Pain Assessment: 0-10 Pain Score: 6  Faces Pain Scale: Hurts even more Pain Location: R knee Pain Descriptors /  Indicators: Aching, Guarding, Grimacing Pain Intervention(s): Monitored during session     Hand Dominance     Extremity/Trunk Assessment Upper Extremity Assessment Upper Extremity Assessment: Overall WFL for tasks assessed   Lower Extremity Assessment Lower Extremity Assessment: Defer to PT evaluation       Communication Communication Communication: No difficulties   Cognition Arousal/Alertness: Awake/alert Behavior During Therapy: WFL for tasks assessed/performed Overall Cognitive Status: Within Functional Limits for tasks assessed                                       General Comments  Pt reporting increased nausea and sweating with ambulation, VSS.    Exercises     Shoulder Instructions      Home Living Family/patient expects to be discharged to:: Private residence Living Arrangements: Spouse/significant other Available Help at Discharge: Family;Available 24 hours/day Type of Home: House Home Access: Stairs to enter [employer] of Steps: 6 Entrance Stairs-Rails: Can reach both Home Layout: One level     Bathroom Shower/Tub: Producer, television/film/video: Standard     Home Equipment: None   Additional Comments: Pt lives at home with husband, granddaughter will be staying to assist as well.      Prior Functioning/Environment Prior Level of Function : Independent/Modified Independent;Driving             Mobility Comments: ind ADLs Comments: ind        OT Problem List: Decreased range of motion;Decreased activity tolerance;Impaired balance (sitting and/or standing);Pain      OT Treatment/Interventions:      OT Goals(Current goals can be found in the care plan section) Acute Rehab OT Goals Patient Stated Goal: to return home to family OT Goal Formulation: With patient Time For Goal Achievement: 08/11/22 Potential to Achieve Goals: Good  OT Frequency:      Co-evaluation              AM-PAC OT "6 Clicks"  Daily Activity     Outcome Measure Help from another person eating meals?: None Help from another person taking care of personal grooming?: None Help from another person toileting, which includes using toliet, bedpan, or urinal?: A Little Help from another person bathing (including washing, rinsing, drying)?: A Little Help from another person to put on and taking off regular upper body clothing?: None Help from another person to put on and taking off regular lower body clothing?: None 6 Click Score: 22   End of Session Equipment Utilized During Treatment: Rolling walker (2 wheels) CPM Right Knee CPM Right Knee: Off Additional Comments: continuous ice applied to R knee Nurse Communication: Mobility status  Activity Tolerance: Patient tolerated treatment well Patient left: in bed;with call bell/phone within reach  OT Visit Diagnosis: Other abnormalities of gait and mobility (R26.89);Pain;Unsteadiness on feet (R26.81) Pain - Right/Left: Right Pain - part of body: Knee                Time: 1610-9604 OT Time Calculation (min): 23 min Charges:  OT General Charges $OT Visit: 1 Visit OT Evaluation $OT Eval Low Complexity: 1 Low OT Treatments $Self Care/Home Management : 8-22 mins  [place], OTR/L   Alexis Goodell 07/28/2022, 11:15 AM

## 2022-07-29 ENCOUNTER — Telehealth: Payer: Self-pay | Admitting: *Deleted

## 2022-07-29 DIAGNOSIS — M5135 Other intervertebral disc degeneration, thoracolumbar region: Secondary | ICD-10-CM | POA: Diagnosis not present

## 2022-07-29 DIAGNOSIS — Z471 Aftercare following joint replacement surgery: Secondary | ICD-10-CM | POA: Diagnosis not present

## 2022-07-29 DIAGNOSIS — I10 Essential (primary) hypertension: Secondary | ICD-10-CM | POA: Diagnosis not present

## 2022-07-29 DIAGNOSIS — Z86010 Personal history of colonic polyps: Secondary | ICD-10-CM | POA: Diagnosis not present

## 2022-07-29 DIAGNOSIS — K219 Gastro-esophageal reflux disease without esophagitis: Secondary | ICD-10-CM | POA: Diagnosis not present

## 2022-07-29 DIAGNOSIS — M1712 Unilateral primary osteoarthritis, left knee: Secondary | ICD-10-CM | POA: Diagnosis not present

## 2022-07-29 DIAGNOSIS — G43909 Migraine, unspecified, not intractable, without status migrainosus: Secondary | ICD-10-CM | POA: Diagnosis not present

## 2022-07-29 DIAGNOSIS — E782 Mixed hyperlipidemia: Secondary | ICD-10-CM | POA: Diagnosis not present

## 2022-07-29 DIAGNOSIS — M810 Age-related osteoporosis without current pathological fracture: Secondary | ICD-10-CM | POA: Diagnosis not present

## 2022-07-29 DIAGNOSIS — Z9181 History of falling: Secondary | ICD-10-CM | POA: Diagnosis not present

## 2022-07-29 DIAGNOSIS — G4700 Insomnia, unspecified: Secondary | ICD-10-CM | POA: Diagnosis not present

## 2022-07-29 DIAGNOSIS — G8929 Other chronic pain: Secondary | ICD-10-CM | POA: Diagnosis not present

## 2022-07-29 DIAGNOSIS — E039 Hypothyroidism, unspecified: Secondary | ICD-10-CM | POA: Diagnosis not present

## 2022-07-29 DIAGNOSIS — Z981 Arthrodesis status: Secondary | ICD-10-CM | POA: Diagnosis not present

## 2022-07-29 DIAGNOSIS — Z7982 Long term (current) use of aspirin: Secondary | ICD-10-CM | POA: Diagnosis not present

## 2022-07-29 DIAGNOSIS — Z96651 Presence of right artificial knee joint: Secondary | ICD-10-CM | POA: Diagnosis not present

## 2022-07-29 DIAGNOSIS — D649 Anemia, unspecified: Secondary | ICD-10-CM | POA: Diagnosis not present

## 2022-07-29 NOTE — Telephone Encounter (Signed)
Ortho bundle D/C call completed; reminded patient to call with questions or needs.

## 2022-07-29 NOTE — Discharge Summary (Signed)
Physician Discharge Summary  Patient ID: ROSAISELA JAMROZ MRN: [id number] DOB/AGE: [birth year] 83 y.o.  Admit date: 07/27/2022 Discharge date: 07/28/22  Admission Diagnoses: Right knee primary osteoarthritis  Discharge Diagnoses: Same Principal Problem:   S/P TKR (total knee replacement), right   Discharged Condition: good  Hospital Course: Patient was admitted underwent right total knee arthroplasty with abductor block spinal anesthesia Exparel and Marcaine.  Postop ice machine CPM physical therapy, Occupational Therapy.  She made good progress and was amatory and discharged the following day after surgery on 4/  Consults:  Physical therapy Occupational Therapy, case management/service for home equipment needs and home health physical therapy arrangements.  Significant Diagnostic Studies:   Treatments: surgery: Right total knee arthroplasty.  Discharge Exam: Blood pressure (!) 142/69, pulse 65, temperature 98.3 F (36.8 C), temperature source Oral, resp. rate 17, height  (1.727 m), weight 85.3 kg, SpO2 98 %. Mild swelling of the right knee at time of discharge.  Dressing was dry and intact with new Mepilex.  Disposition: Discharge disposition: 01-Home or Self Care        Allergies as of 07/28/2022       Reactions   Hydromorphone Itching, Nausea Only, Other (See Comments)   After IV dilaudid pt flushed and felt dizzy   Ciprofloxacin Other (See Comments)   Stomach problems   Codeine Itching   Latex Other (See Comments)   fainted   Morphine And Related Itching   migraine   Nsaids Nausea And Vomiting   Penicillins Itching, Nausea And Vomiting   Has patient had a PCN reaction causing immediate rash, facial/tongue/throat swelling, SOB or lightheadedness with hypotension: No Has patient had a PCN reaction causing severe rash involving mucus membranes or skin necrosis: No Has patient had a PCN reaction that required hospitalization No Has patient had a PCN reaction  occurring within the last 10 years: Yes If all of the above answers are "NO", then may proceed with Cephalosporin use.   Sulfa Antibiotics Nausea And Vomiting        Medication List     STOP taking these medications    acetaminophen 500 MG tablet Commonly known as: TYLENOL       TAKE these medications    albuterol 108 (90 Base) MCG/ACT inhaler Commonly known as: VENTOLIN HFA Inhale 1-2 puffs into the lungs every 6 (six) hours as needed for wheezing or shortness of breath.   amLODipine 2.5 MG tablet Commonly known as: NORVASC TAKE 1 TABLET BY MOUTH TWICE  DAILY   aspirin EC 81 MG tablet Take 1 tablet (81 mg total) by mouth daily. Swallow whole.   atorvastatin 20 MG tablet Commonly known as: LIPITOR Take 1 tablet (20 mg total) by mouth daily.   b complex vitamins capsule Take 1 capsule by mouth daily.   CLEAR EYES FOR DRY EYES OP Place 1 drop into both eyes daily as needed (dry eyes).   donepezil 10 MG disintegrating tablet Commonly known as: ARICEPT ODT Take 1 tablet (10 mg total) by mouth at bedtime.   esomeprazole 40 MG capsule Commonly known as: NEXIUM Take 1 capsule (40 mg total) by mouth daily before breakfast.   famotidine 40 MG tablet Commonly known as: PEPCID Take 1 tablet (40 mg total) by mouth at bedtime.   levothyroxine 50 MCG tablet Commonly known as: SYNTHROID Take 1 tablet (50 mcg total) by mouth daily.   losartan 100 MG tablet Commonly known as: COZAAR Take 1 tablet (100 mg total) by mouth daily.  Melatonin 10 MG Tabs Take 20 mg by mouth at bedtime.   methocarbamol 500 MG tablet Commonly known as: ROBAXIN Take 1 tablet (500 mg total) by mouth every 6 (six) hours as needed for muscle spasms.   NON FORMULARY Apply 1 Application topically 2 (two) times daily. Hemp Cream   oxyCODONE-acetaminophen 5-325 MG tablet Commonly known as: Percocet Take 1-2 tablets by mouth every 6 (six) hours as needed for severe pain.   sertraline 100 MG  tablet Commonly known as: ZOLOFT Take 1 tablet (100 mg total) by mouth at bedtime.        Follow-up Information     [name], MD. Go on 08/04/2022.   Specialty: Orthopedic Surgery Why: at 1:00 pm for your first appointment with Dr. Ophelia Charter in his [facility] office Contact information: [address] Thera Flake [place] [address] [phone]         [facility] Follow up.   Specialty: Home Health Services Why: Someone from the home health agency will be in contact with you after discharge to arrange your first in home physical therapy appointment Contact information: [address] [place] [address] [phone]                 Signed: [name] 07/29/2022, 10:12 AM

## 2022-08-01 DIAGNOSIS — G43909 Migraine, unspecified, not intractable, without status migrainosus: Secondary | ICD-10-CM | POA: Diagnosis not present

## 2022-08-01 DIAGNOSIS — K219 Gastro-esophageal reflux disease without esophagitis: Secondary | ICD-10-CM | POA: Diagnosis not present

## 2022-08-01 DIAGNOSIS — I10 Essential (primary) hypertension: Secondary | ICD-10-CM | POA: Diagnosis not present

## 2022-08-01 DIAGNOSIS — Z981 Arthrodesis status: Secondary | ICD-10-CM | POA: Diagnosis not present

## 2022-08-01 DIAGNOSIS — Z9181 History of falling: Secondary | ICD-10-CM | POA: Diagnosis not present

## 2022-08-01 DIAGNOSIS — E039 Hypothyroidism, unspecified: Secondary | ICD-10-CM | POA: Diagnosis not present

## 2022-08-01 DIAGNOSIS — M1712 Unilateral primary osteoarthritis, left knee: Secondary | ICD-10-CM | POA: Diagnosis not present

## 2022-08-01 DIAGNOSIS — G8929 Other chronic pain: Secondary | ICD-10-CM | POA: Diagnosis not present

## 2022-08-01 DIAGNOSIS — E782 Mixed hyperlipidemia: Secondary | ICD-10-CM | POA: Diagnosis not present

## 2022-08-01 DIAGNOSIS — Z7982 Long term (current) use of aspirin: Secondary | ICD-10-CM | POA: Diagnosis not present

## 2022-08-01 DIAGNOSIS — Z86010 Personal history of colonic polyps: Secondary | ICD-10-CM | POA: Diagnosis not present

## 2022-08-01 DIAGNOSIS — Z471 Aftercare following joint replacement surgery: Secondary | ICD-10-CM | POA: Diagnosis not present

## 2022-08-01 DIAGNOSIS — Z96651 Presence of right artificial knee joint: Secondary | ICD-10-CM | POA: Diagnosis not present

## 2022-08-01 DIAGNOSIS — M810 Age-related osteoporosis without current pathological fracture: Secondary | ICD-10-CM | POA: Diagnosis not present

## 2022-08-01 DIAGNOSIS — D649 Anemia, unspecified: Secondary | ICD-10-CM | POA: Diagnosis not present

## 2022-08-01 DIAGNOSIS — G4700 Insomnia, unspecified: Secondary | ICD-10-CM | POA: Diagnosis not present

## 2022-08-01 DIAGNOSIS — M5135 Other intervertebral disc degeneration, thoracolumbar region: Secondary | ICD-10-CM | POA: Diagnosis not present

## 2022-08-02 ENCOUNTER — Telehealth: Payer: Self-pay | Admitting: Radiology

## 2022-08-02 DIAGNOSIS — I10 Essential (primary) hypertension: Secondary | ICD-10-CM | POA: Diagnosis not present

## 2022-08-02 DIAGNOSIS — Z96651 Presence of right artificial knee joint: Secondary | ICD-10-CM | POA: Diagnosis not present

## 2022-08-02 DIAGNOSIS — M6281 Muscle weakness (generalized): Secondary | ICD-10-CM | POA: Diagnosis not present

## 2022-08-02 DIAGNOSIS — J449 Chronic obstructive pulmonary disease, unspecified: Secondary | ICD-10-CM | POA: Diagnosis not present

## 2022-08-02 DIAGNOSIS — S52502A Unspecified fracture of the lower end of left radius, initial encounter for closed fracture: Secondary | ICD-10-CM | POA: Diagnosis not present

## 2022-08-02 DIAGNOSIS — R2689 Other abnormalities of gait and mobility: Secondary | ICD-10-CM | POA: Diagnosis not present

## 2022-08-02 DIAGNOSIS — W19XXXA Unspecified fall, initial encounter: Secondary | ICD-10-CM | POA: Diagnosis not present

## 2022-08-02 DIAGNOSIS — E782 Mixed hyperlipidemia: Secondary | ICD-10-CM | POA: Diagnosis not present

## 2022-08-02 DIAGNOSIS — Z743 Need for continuous supervision: Secondary | ICD-10-CM | POA: Diagnosis not present

## 2022-08-02 DIAGNOSIS — Z79899 Other long term (current) drug therapy: Secondary | ICD-10-CM | POA: Diagnosis not present

## 2022-08-02 DIAGNOSIS — S42032A Displaced fracture of lateral end of left clavicle, initial encounter for closed fracture: Secondary | ICD-10-CM | POA: Diagnosis not present

## 2022-08-02 DIAGNOSIS — S52351A Displaced comminuted fracture of shaft of radius, right arm, initial encounter for closed fracture: Secondary | ICD-10-CM | POA: Diagnosis not present

## 2022-08-02 DIAGNOSIS — Z7952 Long term (current) use of systemic steroids: Secondary | ICD-10-CM | POA: Diagnosis not present

## 2022-08-02 DIAGNOSIS — S3282XA Multiple fractures of pelvis without disruption of pelvic ring, initial encounter for closed fracture: Secondary | ICD-10-CM | POA: Diagnosis not present

## 2022-08-02 DIAGNOSIS — Z791 Long term (current) use of non-steroidal anti-inflammatories (NSAID): Secondary | ICD-10-CM | POA: Diagnosis not present

## 2022-08-02 DIAGNOSIS — S6992XA Unspecified injury of left wrist, hand and finger(s), initial encounter: Secondary | ICD-10-CM | POA: Diagnosis not present

## 2022-08-02 DIAGNOSIS — S52572A Other intraarticular fracture of lower end of left radius, initial encounter for closed fracture: Secondary | ICD-10-CM | POA: Diagnosis not present

## 2022-08-02 DIAGNOSIS — R6889 Other general symptoms and signs: Secondary | ICD-10-CM | POA: Diagnosis not present

## 2022-08-02 DIAGNOSIS — F32A Depression, unspecified: Secondary | ICD-10-CM | POA: Diagnosis not present

## 2022-08-02 DIAGNOSIS — Z043 Encounter for examination and observation following other accident: Secondary | ICD-10-CM | POA: Diagnosis not present

## 2022-08-02 DIAGNOSIS — X58XXXD Exposure to other specified factors, subsequent encounter: Secondary | ICD-10-CM | POA: Diagnosis not present

## 2022-08-02 DIAGNOSIS — K219 Gastro-esophageal reflux disease without esophagitis: Secondary | ICD-10-CM | POA: Diagnosis not present

## 2022-08-02 DIAGNOSIS — G4700 Insomnia, unspecified: Secondary | ICD-10-CM | POA: Diagnosis not present

## 2022-08-02 DIAGNOSIS — S3993XA Unspecified injury of pelvis, initial encounter: Secondary | ICD-10-CM | POA: Diagnosis not present

## 2022-08-02 DIAGNOSIS — Z9181 History of falling: Secondary | ICD-10-CM | POA: Diagnosis not present

## 2022-08-02 DIAGNOSIS — S52352D Displaced comminuted fracture of shaft of radius, left arm, subsequent encounter for closed fracture with routine healing: Secondary | ICD-10-CM | POA: Diagnosis not present

## 2022-08-02 DIAGNOSIS — W1830XD Fall on same level, unspecified, subsequent encounter: Secondary | ICD-10-CM | POA: Diagnosis not present

## 2022-08-02 DIAGNOSIS — Z471 Aftercare following joint replacement surgery: Secondary | ICD-10-CM | POA: Diagnosis not present

## 2022-08-02 DIAGNOSIS — W010XXA Fall on same level from slipping, tripping and stumbling without subsequent striking against object, initial encounter: Secondary | ICD-10-CM | POA: Diagnosis not present

## 2022-08-02 DIAGNOSIS — E039 Hypothyroidism, unspecified: Secondary | ICD-10-CM | POA: Diagnosis not present

## 2022-08-02 DIAGNOSIS — S0990XA Unspecified injury of head, initial encounter: Secondary | ICD-10-CM | POA: Diagnosis not present

## 2022-08-02 DIAGNOSIS — E785 Hyperlipidemia, unspecified: Secondary | ICD-10-CM | POA: Diagnosis not present

## 2022-08-02 DIAGNOSIS — S52502D Unspecified fracture of the lower end of left radius, subsequent encounter for closed fracture with routine healing: Secondary | ICD-10-CM | POA: Diagnosis not present

## 2022-08-02 DIAGNOSIS — Z4789 Encounter for other orthopedic aftercare: Secondary | ICD-10-CM | POA: Diagnosis not present

## 2022-08-02 DIAGNOSIS — M810 Age-related osteoporosis without current pathological fracture: Secondary | ICD-10-CM | POA: Diagnosis not present

## 2022-08-02 NOTE — Telephone Encounter (Signed)
I spoke with patient's granddaughter. She states that patient did not ring her bell in the middle of the night and instead got up to the potty chair.  The potty chair flipped and she hit the end of the bed and fell into the floor. She is currently in the trauma room at [facility] ED and in finger traps. Dr. Emilia Beck (ortho) has come in and consulted. He explained to the family that she will need surgery, but he is unsure what he will have to do and is waiting to get post reduction films.   Per granddaughter, patient's knee is swollen, but it has been since surgery. She is not aware of injury to knee but will make sure they know she has had recent knee replacement. She is not sure they will discharge patient home and states that she may go to skilled nursing due to the wrist injury and safety. She is also unsure if patient will be admitted to [facility].  I asked granddaughter to keep [place] updated on patient's progress.

## 2022-08-02 NOTE — Telephone Encounter (Signed)
Someone with patient left voicemail on [place] office phone. Patient fell and has wrist fracture. Is currently at [facility] and requests call.  Patient's info: [phone] Judie Petit)  [place]ub[place],Annette Daughter   [phone]

## 2022-08-03 ENCOUNTER — Telehealth: Payer: Self-pay | Admitting: *Deleted

## 2022-08-03 NOTE — Telephone Encounter (Signed)
RNCM found out through medical assistant of Dr. Ophelia Charter that patient's family called into [facility] office and notified that patient fell and is currently at [facility]. She does have a fractured left wrist and had surgery yesterday. Received call today from Lupita Leash, case manager with [employer] who asked about getting a platform for the RW received last week. I contacted Jermaine with [employer], who provided the walker prior to discharge last week and he is waiting on a delivery of walker platforms and will notify me when they are received. Explained where patient currently is and he states he will assist. Updated Lupita Leash with [facility].

## 2022-08-04 ENCOUNTER — Encounter: Payer: Medicare Other | Admitting: Orthopaedic Surgery

## 2022-08-08 DIAGNOSIS — S52502D Unspecified fracture of the lower end of left radius, subsequent encounter for closed fracture with routine healing: Secondary | ICD-10-CM | POA: Diagnosis not present

## 2022-08-08 DIAGNOSIS — Z79899 Other long term (current) drug therapy: Secondary | ICD-10-CM | POA: Diagnosis not present

## 2022-08-08 DIAGNOSIS — R2689 Other abnormalities of gait and mobility: Secondary | ICD-10-CM | POA: Diagnosis not present

## 2022-08-08 DIAGNOSIS — I10 Essential (primary) hypertension: Secondary | ICD-10-CM | POA: Diagnosis not present

## 2022-08-08 DIAGNOSIS — Z471 Aftercare following joint replacement surgery: Secondary | ICD-10-CM | POA: Diagnosis not present

## 2022-08-08 DIAGNOSIS — Z9181 History of falling: Secondary | ICD-10-CM | POA: Diagnosis not present

## 2022-08-08 DIAGNOSIS — W1830XD Fall on same level, unspecified, subsequent encounter: Secondary | ICD-10-CM | POA: Diagnosis not present

## 2022-08-08 DIAGNOSIS — F32A Depression, unspecified: Secondary | ICD-10-CM | POA: Diagnosis not present

## 2022-08-08 DIAGNOSIS — M6281 Muscle weakness (generalized): Secondary | ICD-10-CM | POA: Diagnosis not present

## 2022-08-08 DIAGNOSIS — M25532 Pain in left wrist: Secondary | ICD-10-CM | POA: Diagnosis not present

## 2022-08-08 DIAGNOSIS — E039 Hypothyroidism, unspecified: Secondary | ICD-10-CM | POA: Diagnosis not present

## 2022-08-08 DIAGNOSIS — Z96651 Presence of right artificial knee joint: Secondary | ICD-10-CM | POA: Diagnosis not present

## 2022-08-08 DIAGNOSIS — R531 Weakness: Secondary | ICD-10-CM | POA: Diagnosis not present

## 2022-08-08 DIAGNOSIS — S52202A Unspecified fracture of shaft of left ulna, initial encounter for closed fracture: Secondary | ICD-10-CM | POA: Diagnosis not present

## 2022-08-08 DIAGNOSIS — G4700 Insomnia, unspecified: Secondary | ICD-10-CM | POA: Diagnosis not present

## 2022-08-08 DIAGNOSIS — E785 Hyperlipidemia, unspecified: Secondary | ICD-10-CM | POA: Diagnosis not present

## 2022-08-08 DIAGNOSIS — E782 Mixed hyperlipidemia: Secondary | ICD-10-CM | POA: Diagnosis not present

## 2022-08-08 DIAGNOSIS — J449 Chronic obstructive pulmonary disease, unspecified: Secondary | ICD-10-CM | POA: Diagnosis not present

## 2022-08-08 DIAGNOSIS — R42 Dizziness and giddiness: Secondary | ICD-10-CM | POA: Diagnosis not present

## 2022-08-09 DIAGNOSIS — S52202A Unspecified fracture of shaft of left ulna, initial encounter for closed fracture: Secondary | ICD-10-CM | POA: Diagnosis not present

## 2022-08-09 DIAGNOSIS — E782 Mixed hyperlipidemia: Secondary | ICD-10-CM | POA: Diagnosis not present

## 2022-08-09 DIAGNOSIS — R531 Weakness: Secondary | ICD-10-CM | POA: Diagnosis not present

## 2022-08-09 DIAGNOSIS — I10 Essential (primary) hypertension: Secondary | ICD-10-CM | POA: Diagnosis not present

## 2022-08-09 DIAGNOSIS — Z96651 Presence of right artificial knee joint: Secondary | ICD-10-CM | POA: Diagnosis not present

## 2022-08-11 ENCOUNTER — Ambulatory Visit (INDEPENDENT_AMBULATORY_CARE_PROVIDER_SITE_OTHER): Payer: Medicare Other | Admitting: Orthopaedic Surgery

## 2022-08-11 ENCOUNTER — Encounter: Payer: Self-pay | Admitting: Orthopaedic Surgery

## 2022-08-11 VITALS — Ht 68.0 in | Wt 188.0 lb

## 2022-08-11 DIAGNOSIS — Z96651 Presence of right artificial knee joint: Secondary | ICD-10-CM

## 2022-08-11 NOTE — Progress Notes (Signed)
Post-Op Visit Note   Patient: Debra Sanchez           Date of Birth: 06/17/1940           MRN: [id number] Visit Date: 08/11/2022 PCP: Natalia Leatherwood, DO   Assessment & Plan: 82 year old female post right total knee arthroplasty.  Staples look good and ready to be removed today.  Unfortunately postop she fell going to the bathroom with left distal radius fracture which was plate with volar plate and 2 pins in the radial styloid.  Postop x-rays look good.  She is in a sugar-tong forearm and today staples harvested from her knee continue knee rehab postop program with weightbearing as tolerated knee range of motion and strengthening.  Unfortunately her independent ambulation is being delayed due to her unfortunate arm fracture.  I plan to check her in 4 weeks.  Chief Complaint:  Chief Complaint  Patient presents with   Right Knee - Routine Post Op    07/27/2022 Right TKA   Visit Diagnoses:  1. S/P TKR (total knee replacement), right     Plan: Return 4 weeks  Follow-Up Instructions: No follow-ups on file.   Orders:  No orders of the defined types were placed in this encounter.  No orders of the defined types were placed in this encounter.   Imaging: No results found.  PMFS History: Patient Active Problem List   Diagnosis Date Noted   S/P TKR (total knee replacement), right 07/27/2022   Preop cardiovascular exam 05/16/2022   Palpitations 05/16/2022   Carotid bruit 05/16/2022   Increase in creatinine 04/28/2022   Bilateral primary osteoarthritis of knee 03/17/2022   Memory changes 10/19/2021   Meningioma (HCC), 8 mm right temporal 03/18/2021   Esophageal dysphagia 10/13/2020   Continuous leakage of urine 03/18/2020   Mixed hyperlipidemia 03/19/2019   GERD (gastroesophageal reflux disease) 12/18/2018   Anxiety 10/22/2018   Overweight (BMI 25.0-29.9) 10/22/2018   Chronic pain of both knees 01/29/2018   Persistent headaches 11/18/2016   Hiatal hernia with GERD  06/17/2016   Status post laparoscopic Nissen fundoplication 08/25/2015   Pulmonary nodules 05/12/2015   Insomnia 04/20/2015   Hypertension 05/15/2012   Hypothyroidism 05/15/2012   History of colonic polyps 05/15/2012   Anemia 05/15/2012   Heme positive stool 05/15/2012   Past Medical History:  Diagnosis Date   Anemia    HX from bleeding ulcer. 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   Pneumonia yrs ago   Syncope 2012   Wears glasses     Family History  Problem Relation Age of Onset   Colon cancer Mother    Heart disease Mother    Arthritis Mother    Heart disease Father    Arthritis Father    Arthritis Sister  Arthritis Brother    Lung cancer Brother        smoker   Arthritis Maternal Aunt    Arthritis Maternal Uncle    Diabetes Maternal Uncle     Past Surgical History:  Procedure Laterality Date   ABDOMINAL HYSTERECTOMY     ANTERIOR CERVICAL DECOMP/DISCECTOMY FUSION     C5-6 and C6-7 anterior cervical diskectomy and fusion    BILATERAL OOPHORECTOMY     CATARACT EXTRACTION W/PHACO Left 10/20/2014   Procedure: CATARACT EXTRACTION PHACO AND INTRAOCULAR LENS PLACEMENT LEFT EYE;  Surgeon: Gemma Payor, MD;  Location: [facility];  Service: Ophthalmology;  Laterality: Left;  CDE:6.98   CATARACT EXTRACTION W/PHACO Right 11/17/2014   Procedure: CATARACT EXTRACTION PHACO AND INTRAOCULAR LENS PLACEMENT RIGHT EYE CDE=6.57;  Surgeon:  Gemma Payor, MD;  Location: [facility];  Service: Ophthalmology;  Laterality: Right;   COLONOSCOPY  01/19/2011   Procedure: COLONOSCOPY;  Surgeon: Malissa Hippo, MD;  Location: [facility];  Service: Endoscopy;  Laterality: N/A;  2:00   COLONOSCOPY WITH PROPOFOL N/A 09/15/2021   Procedure: COLONOSCOPY WITH PROPOFOL;  Surgeon: Malissa Hippo, MD;  Location: [facility];  Service: Endoscopy;  Laterality: N/A;  1230 ASA 2   DILATION AND CURETTAGE OF UTERUS     ESOPHAGOGASTRODUODENOSCOPY  11/05/2010   Procedure: ESOPHAGOGASTRODUODENOSCOPY (EGD);  Surgeon: Malissa Hippo, MD;  [facility]: [facility];  Service: Endoscopy;  Laterality: N/A;  7:00 am per Melanie   ESOPHAGOGASTRODUODENOSCOPY N/A 07/25/2012   Procedure: ESOPHAGOGASTRODUODENOSCOPY (EGD);  Surgeon: Malissa Hippo, MD;  Location: [facility];  Service: Endoscopy;  Laterality: N/A;  400-moved to 420 Ann notified pt   ESOPHAGOGASTRODUODENOSCOPY N/A 05/27/2015   Procedure: ESOPHAGOGASTRODUODENOSCOPY (EGD);  Surgeon: Malissa Hippo, MD;  Location: [facility];  Service: Endoscopy;  Laterality: N/A;  2:00   HIATAL HERNIA REPAIR N/A 08/25/2015   Procedure: LAPAROSCOPIC REPAIR OF LARGE TYPE III  HIATAL HERNIA;  Surgeon: Luretha Murphy, MD;  Location: [facility];  Service: General;  Laterality: N/A;   LAPAROSCOPIC NISSEN FUNDOPLICATION N/A 06/17/2016   Procedure: ENDOSCOPY, HIATAL HERNIA REPAIR, AND TAKE DOWN OF NISSEN FUNDOPILICATION;  Surgeon: Luretha Murphy, MD;  Location: [facility];  Service: General;  Laterality: N/A;   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

## 2022-08-16 DIAGNOSIS — R2689 Other abnormalities of gait and mobility: Secondary | ICD-10-CM | POA: Diagnosis not present

## 2022-08-16 DIAGNOSIS — M6281 Muscle weakness (generalized): Secondary | ICD-10-CM | POA: Diagnosis not present

## 2022-08-16 DIAGNOSIS — S52502D Unspecified fracture of the lower end of left radius, subsequent encounter for closed fracture with routine healing: Secondary | ICD-10-CM | POA: Diagnosis not present

## 2022-08-16 DIAGNOSIS — J449 Chronic obstructive pulmonary disease, unspecified: Secondary | ICD-10-CM | POA: Diagnosis not present

## 2022-08-18 ENCOUNTER — Other Ambulatory Visit (HOSPITAL_COMMUNITY): Payer: Medicare Other

## 2022-08-18 DIAGNOSIS — R42 Dizziness and giddiness: Secondary | ICD-10-CM | POA: Diagnosis not present

## 2022-08-18 DIAGNOSIS — M25532 Pain in left wrist: Secondary | ICD-10-CM | POA: Diagnosis not present

## 2022-08-21 DIAGNOSIS — M1712 Unilateral primary osteoarthritis, left knee: Secondary | ICD-10-CM | POA: Diagnosis not present

## 2022-08-21 DIAGNOSIS — Z9181 History of falling: Secondary | ICD-10-CM | POA: Diagnosis not present

## 2022-08-21 DIAGNOSIS — Z981 Arthrodesis status: Secondary | ICD-10-CM | POA: Diagnosis not present

## 2022-08-21 DIAGNOSIS — G43909 Migraine, unspecified, not intractable, without status migrainosus: Secondary | ICD-10-CM | POA: Diagnosis not present

## 2022-08-21 DIAGNOSIS — E782 Mixed hyperlipidemia: Secondary | ICD-10-CM | POA: Diagnosis not present

## 2022-08-21 DIAGNOSIS — D649 Anemia, unspecified: Secondary | ICD-10-CM | POA: Diagnosis not present

## 2022-08-21 DIAGNOSIS — Z86010 Personal history of colonic polyps: Secondary | ICD-10-CM | POA: Diagnosis not present

## 2022-08-21 DIAGNOSIS — G8929 Other chronic pain: Secondary | ICD-10-CM | POA: Diagnosis not present

## 2022-08-21 DIAGNOSIS — I10 Essential (primary) hypertension: Secondary | ICD-10-CM | POA: Diagnosis not present

## 2022-08-21 DIAGNOSIS — M810 Age-related osteoporosis without current pathological fracture: Secondary | ICD-10-CM | POA: Diagnosis not present

## 2022-08-21 DIAGNOSIS — Z96651 Presence of right artificial knee joint: Secondary | ICD-10-CM | POA: Diagnosis not present

## 2022-08-21 DIAGNOSIS — Z7982 Long term (current) use of aspirin: Secondary | ICD-10-CM | POA: Diagnosis not present

## 2022-08-21 DIAGNOSIS — M5135 Other intervertebral disc degeneration, thoracolumbar region: Secondary | ICD-10-CM | POA: Diagnosis not present

## 2022-08-21 DIAGNOSIS — G4700 Insomnia, unspecified: Secondary | ICD-10-CM | POA: Diagnosis not present

## 2022-08-21 DIAGNOSIS — Z471 Aftercare following joint replacement surgery: Secondary | ICD-10-CM | POA: Diagnosis not present

## 2022-08-21 DIAGNOSIS — E039 Hypothyroidism, unspecified: Secondary | ICD-10-CM | POA: Diagnosis not present

## 2022-08-21 DIAGNOSIS — K219 Gastro-esophageal reflux disease without esophagitis: Secondary | ICD-10-CM | POA: Diagnosis not present

## 2022-08-22 ENCOUNTER — Telehealth: Payer: Self-pay | Admitting: Family Medicine

## 2022-08-22 ENCOUNTER — Other Ambulatory Visit: Payer: Self-pay | Admitting: Family Medicine

## 2022-08-22 DIAGNOSIS — I10 Essential (primary) hypertension: Secondary | ICD-10-CM

## 2022-08-22 NOTE — Telephone Encounter (Signed)
Caller/Agency: [facility]  Callback Number: [phone] Requesting OT/PT/Skilled Nursing/Social Work/Speech Therapy: Skilled  Nursing and recertification   Frequency: 2 week 1, 1 week 4 and 2 PRN.

## 2022-08-22 NOTE — Telephone Encounter (Signed)
Pt has not been seen by this provider for this condition, which is a requirement in order to sign orders.  Please forward to orthopedic team.

## 2022-08-23 DIAGNOSIS — I10 Essential (primary) hypertension: Secondary | ICD-10-CM | POA: Diagnosis not present

## 2022-08-23 DIAGNOSIS — Z9181 History of falling: Secondary | ICD-10-CM | POA: Diagnosis not present

## 2022-08-23 DIAGNOSIS — Z96651 Presence of right artificial knee joint: Secondary | ICD-10-CM | POA: Diagnosis not present

## 2022-08-23 DIAGNOSIS — Z7982 Long term (current) use of aspirin: Secondary | ICD-10-CM | POA: Diagnosis not present

## 2022-08-23 DIAGNOSIS — D649 Anemia, unspecified: Secondary | ICD-10-CM | POA: Diagnosis not present

## 2022-08-23 DIAGNOSIS — M5135 Other intervertebral disc degeneration, thoracolumbar region: Secondary | ICD-10-CM | POA: Diagnosis not present

## 2022-08-23 DIAGNOSIS — G4700 Insomnia, unspecified: Secondary | ICD-10-CM | POA: Diagnosis not present

## 2022-08-23 DIAGNOSIS — M810 Age-related osteoporosis without current pathological fracture: Secondary | ICD-10-CM | POA: Diagnosis not present

## 2022-08-23 DIAGNOSIS — K219 Gastro-esophageal reflux disease without esophagitis: Secondary | ICD-10-CM | POA: Diagnosis not present

## 2022-08-23 DIAGNOSIS — E039 Hypothyroidism, unspecified: Secondary | ICD-10-CM | POA: Diagnosis not present

## 2022-08-23 DIAGNOSIS — Z471 Aftercare following joint replacement surgery: Secondary | ICD-10-CM | POA: Diagnosis not present

## 2022-08-23 DIAGNOSIS — Z86010 Personal history of colonic polyps: Secondary | ICD-10-CM | POA: Diagnosis not present

## 2022-08-23 DIAGNOSIS — M1712 Unilateral primary osteoarthritis, left knee: Secondary | ICD-10-CM | POA: Diagnosis not present

## 2022-08-23 DIAGNOSIS — Z981 Arthrodesis status: Secondary | ICD-10-CM | POA: Diagnosis not present

## 2022-08-23 DIAGNOSIS — G43909 Migraine, unspecified, not intractable, without status migrainosus: Secondary | ICD-10-CM | POA: Diagnosis not present

## 2022-08-23 DIAGNOSIS — G8929 Other chronic pain: Secondary | ICD-10-CM | POA: Diagnosis not present

## 2022-08-23 DIAGNOSIS — E782 Mixed hyperlipidemia: Secondary | ICD-10-CM | POA: Diagnosis not present

## 2022-08-25 ENCOUNTER — Ambulatory Visit (INDEPENDENT_AMBULATORY_CARE_PROVIDER_SITE_OTHER): Payer: Medicare Other | Admitting: Family Medicine

## 2022-08-25 ENCOUNTER — Encounter: Payer: Self-pay | Admitting: Family Medicine

## 2022-08-25 VITALS — BP 124/84 | HR 90 | Temp 98.0°F | Wt 194.0 lb

## 2022-08-25 DIAGNOSIS — I10 Essential (primary) hypertension: Secondary | ICD-10-CM

## 2022-08-25 DIAGNOSIS — E782 Mixed hyperlipidemia: Secondary | ICD-10-CM

## 2022-08-25 DIAGNOSIS — S52572A Other intraarticular fracture of lower end of left radius, initial encounter for closed fracture: Secondary | ICD-10-CM | POA: Diagnosis not present

## 2022-08-25 DIAGNOSIS — D500 Iron deficiency anemia secondary to blood loss (chronic): Secondary | ICD-10-CM | POA: Diagnosis not present

## 2022-08-25 DIAGNOSIS — E034 Atrophy of thyroid (acquired): Secondary | ICD-10-CM

## 2022-08-25 DIAGNOSIS — Z96651 Presence of right artificial knee joint: Secondary | ICD-10-CM | POA: Diagnosis not present

## 2022-08-25 DIAGNOSIS — F419 Anxiety disorder, unspecified: Secondary | ICD-10-CM

## 2022-08-25 DIAGNOSIS — S52502A Unspecified fracture of the lower end of left radius, initial encounter for closed fracture: Secondary | ICD-10-CM | POA: Insufficient documentation

## 2022-08-25 DIAGNOSIS — E871 Hypo-osmolality and hyponatremia: Secondary | ICD-10-CM | POA: Insufficient documentation

## 2022-08-25 DIAGNOSIS — R197 Diarrhea, unspecified: Secondary | ICD-10-CM | POA: Diagnosis not present

## 2022-08-25 DIAGNOSIS — R42 Dizziness and giddiness: Secondary | ICD-10-CM | POA: Insufficient documentation

## 2022-08-25 DIAGNOSIS — W19XXXA Unspecified fall, initial encounter: Secondary | ICD-10-CM | POA: Insufficient documentation

## 2022-08-25 LAB — CBC WITH DIFFERENTIAL/PLATELET
Basophils Absolute: 0 10*3/uL (ref 0.0–0.1)
Basophils Relative: 0.6 % (ref 0.0–3.0)
Eosinophils Absolute: 0.1 10*3/uL (ref 0.0–0.7)
Eosinophils Relative: 2.5 % (ref 0.0–5.0)
HCT: 32.9 % — ABNORMAL LOW (ref 36.0–46.0)
Hemoglobin: 11 g/dL — ABNORMAL LOW (ref 12.0–15.0)
Lymphocytes Relative: 22.9 % (ref 12.0–46.0)
Lymphs Abs: 1.3 10*3/uL (ref 0.7–4.0)
MCHC: 33.5 g/dL (ref 30.0–36.0)
MCV: 92.9 fl (ref 78.0–100.0)
Monocytes Absolute: 0.5 10*3/uL (ref 0.1–1.0)
Monocytes Relative: 8.6 % (ref 3.0–12.0)
Neutro Abs: 3.8 10*3/uL (ref 1.4–7.7)
Neutrophils Relative %: 65.4 % (ref 43.0–77.0)
Platelets: 232 10*3/uL (ref 150.0–400.0)
RBC: 3.54 Mil/uL — ABNORMAL LOW (ref 3.87–5.11)
RDW: 14.7 % (ref 11.5–15.5)
WBC: 5.7 10*3/uL (ref 4.0–10.5)

## 2022-08-25 LAB — COMPREHENSIVE METABOLIC PANEL
ALT: 10 U/L (ref 0–35)
AST: 12 U/L (ref 0–37)
Albumin: 3.7 g/dL (ref 3.5–5.2)
Alkaline Phosphatase: 107 U/L (ref 39–117)
BUN: 17 mg/dL (ref 6–23)
CO2: 25 mEq/L (ref 19–32)
Calcium: 9.1 mg/dL (ref 8.4–10.5)
Chloride: 104 mEq/L (ref 96–112)
Creatinine, Ser: 0.88 mg/dL (ref 0.40–1.20)
GFR: 60.97 mL/min (ref >60.00)
Glucose, Bld: 85 mg/dL (ref 70–99)
Potassium: 4.4 mEq/L (ref 3.5–5.1)
Sodium: 138 mEq/L (ref 135–145)
Total Bilirubin: 0.4 mg/dL (ref 0.2–1.2)
Total Protein: 6 g/dL (ref 6.0–8.3)

## 2022-08-25 LAB — VITAMIN D 25 HYDROXY (VIT D DEFICIENCY, FRACTURES): VITD: 29.12 ng/mL — ABNORMAL LOW (ref 30.00–100.00)

## 2022-08-25 LAB — IBC + FERRITIN
Ferritin: 64.7 ng/mL (ref 10.0–291.0)
Iron: 37 ug/dL — ABNORMAL LOW (ref 42–145)
Saturation Ratios: 14.8 % — ABNORMAL LOW (ref 20.0–50.0)
TIBC: 250.6 ug/dL (ref 250.0–450.0)
Transferrin: 179 mg/dL — ABNORMAL LOW (ref 212.0–360.0)

## 2022-08-25 MED ORDER — TRAZODONE HCL 50 MG PO TABS: 25.0000 mg | ORAL_TABLET | Freq: Every day | ORAL | 1 refills | Status: DC

## 2022-08-25 MED ORDER — SERTRALINE HCL 100 MG PO TABS: 100.0000 mg | ORAL_TABLET | Freq: Every day | ORAL | 1 refills | Status: DC

## 2022-08-25 NOTE — Patient Instructions (Addendum)
Return in 24 weeks (on 02/09/2023), or if symptoms worsen or fail to improve, for Routine chronic condition follow-up.        Great to see you today.  I have refilled the medication(s) we provide.   If labs were collected, we will inform you of lab results once received either by echart message or telephone call.   - echart message- for normal results that have been seen by the patient already.   - telephone call: abnormal results or if patient has not viewed results in their echart.

## 2022-08-25 NOTE — Progress Notes (Signed)
Debra Sanchez , [birth year], 82 y.o., female MRN: [id number] Patient Care Team    Relationship Specialty Notifications Start End  Natalia Leatherwood, DO PCP - General Family Medicine  04/20/15   Marjo Bicker, MD PCP - Cardiology Cardiology  05/16/22   Marcos Eke, PA-C  Neurology  11/27/20   Coralyn Helling, MD Consulting Physician Pulmonary Disease  04/26/21     Chief Complaint  Patient presents with   Arm Injury    04/23; went to ED on 05/09 but was not seen (left)     Subjective:  Debra Sanchez  is a 82 y.o. female presents for hospital follow up after recent admission on 08/02/2022 for primary diagnosis fall- left radial fx s/p ORIF. Patient was discharged on 08/08/2022 to rehab. Patients discharge summary has been reviewed, as well as all labs/image studies obtained during hospitalization.  Medication reconciliation completed today.  07/27/2022 planned Total knee replacement.  08/02/2022: ED> admission for fall> closed intra-articular fracture of distal end of left radius, initial encounter  Hospital Course: Patient is a pleasant 82 year old female who was admitted to the hospital on 08/02/2022 after sustaining a ground-level fall onto her left wrist. She had previously undergone an uncomplicated right total knee arthroplasty the week prior by Dr. Ophelia Charter at [facility] from which she had been receiving home health/therapy and recovering well. She noticed immediate deformity to her wrist and presented to the emergency department where images demonstrated a displaced distal radius fracture for which orthopedics was consulted. The patient was taken to the operating room that evening for open reduction and internal fixation and admitted for overnight monitoring as well as evaluation by physical therapy for consideration of disposition planning. She will remain in the hospital until 08/08/2022 due to concerns for her ability to take care of herself at home. She was stable at the time  of discharge with scheduled follow-up with [facility] orthopedics for her wrist and planned follow-up for her knee with Dr. Ophelia Charter.  Orthopedic follow-up 08/18/2022- She reported an incident of passing out at the rehabilitation facility.  She also had lightheadedness at her orthopedic appointment. She was instructed to continue with the sugar-tong immobilization for 2 additional weeks and then follow-up again with orthopedics with a repeat x-ray, which would occur next week.  There was reported mild redness, no drainage surrounding pins and patient was placed on Cleocin 3 times daily x 10 days.  Her lightheadedness blood sugars were checked and were normal.  She continued to have dizziness with mild hypotension and patient was sent from the orthopedic office to the emergency room.  109/65 HR 76. ED note 08/18/2022: Pt has been in rehab facility due to knee and arm surgery. Pt took a bath this am and felt weak, near syncope at approx 9am. Vs were stable per family member. Also blood work was drawn today at the rehab center. Pt then was transported to Dr Michail Sermon office for routine visit. Pt again had a near fainting episode at the office. Pt denies any chest pain during these episodes, no headaches or fevers. Pt does report that she has been having episodes of diarrhea for the past three weeks. > PT left AMA from ED 114/58 HR77 96% afebrile. Patient had become significantly anemic after her knee replacement with a hemoglobin as low as 8.4.   5/9 labs: WBC 6.5, Hgb 10.6, HCT 33.4, platelet 348, sodium 134, potassium 3.8, chloride 101, creatinine 0.88, calcium 9.2, albumin 3.0, EGFR 66  Since hospital discharge patient reports overall she is feeling like she is doing okay considering.  She is tolerating p.o.  Bowel habits normal.  She denies fevers or chills.  She is working with physical therapy.  She has follow-ups for with our orthopedic team. She does report increased anxiety, but also endorses cutting back to half a  tab of her Zoloft compared to normal.   Recent Labs  Lab 08/25/22 1134  HGB 11.0*  HCT 32.9*  WBC 5.7  PLT 232.0      Latest Ref Rng & Units 08/25/2022   11:34 AM 07/28/2022    3:30 AM 07/22/2022   11:07 AM  CMP  Glucose 70 - 99 mg/dL 85  454  95   BUN 6 - 23 mg/dL 17  14  12    Creatinine 0.40 - 1.20 mg/dL 0.98  1.19  1.47   Sodium 135 - 145 mEq/L 138  136  138   Potassium 3.5 - 5.1 mEq/L 4.4  4.0  3.8   Chloride 96 - 112 mEq/L 104  104  105   CO2 19 - 32 mEq/L 25  26  27    Calcium 8.4 - 10.5 mg/dL 9.1  8.5  9.3   Total Protein 6.0 - 8.3 g/dL 6.0     Total Bilirubin 0.2 - 1.2 mg/dL 0.4     Alkaline Phos 39 - 117 U/L 107     AST 0 - 37 U/L 12     ALT 0 - 35 U/L 10         No results found.      08/25/2022   10:59 AM 04/27/2022    1:33 PM 12/23/2021   11:02 AM 10/19/2021   10:33 AM 04/26/2021   10:43 AM  Depression screen PHQ 2/9  Decreased Interest 2 0 0 1 0  Down, Depressed, Hopeless 0 0 0 1 0  PHQ - 2 Score 2 0 0 2 0  Altered sleeping 3   3   Tired, decreased energy 2   1   Change in appetite 2   2   Feeling bad or failure about yourself  0   0   Trouble concentrating 0   0   Moving slowly or fidgety/restless 0   0   Suicidal thoughts 0   0   PHQ-9 Score 9   8   Difficult doing work/chores Very difficult        Allergies  Allergen Reactions   Hydromorphone Itching, Nausea Only and Other (See Comments)    After IV dilaudid pt flushed and felt dizzy   Ciprofloxacin Other (See Comments)    Stomach problems   Codeine Itching   Latex Other (See Comments)    fainted   Morphine And Codeine Itching    migraine   Nsaids Nausea And Vomiting   Penicillins Itching and Nausea And Vomiting    Has patient had a PCN reaction causing immediate rash, facial/tongue/throat swelling, SOB or lightheadedness with hypotension: No Has patient had a PCN reaction causing severe rash involving mucus membranes or skin necrosis: No Has patient had a PCN reaction that required  hospitalization No Has patient had a PCN reaction occurring within the last 10 years: Yes If all of the above answers are "NO", then may proceed with Cephalosporin use.    Sulfa Antibiotics Nausea And Vomiting   Social History   Tobacco Use   Smoking status: Never    Passive exposure: Never   Smokeless tobacco:  Never  Substance Use Topics   Alcohol use: No    Alcohol/week: 0.0 standard drinks of alcohol   Past Medical History:  Diagnosis Date   Anemia    HX from bleeding ulcer. 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   Pneumonia yrs ago   Syncope 2012   Wears glasses    Past Surgical History:  Procedure Laterality Date   ABDOMINAL HYSTERECTOMY     ANTERIOR CERVICAL DECOMP/DISCECTOMY FUSION     C5-6 and C6-7 anterior cervical diskectomy and fusion    BILATERAL OOPHORECTOMY     CATARACT EXTRACTION W/PHACO Left 10/20/2014   Procedure: CATARACT EXTRACTION PHACO AND INTRAOCULAR LENS PLACEMENT LEFT EYE;  Surgeon: Gemma Payor, MD;  Location: [facility];  Service: Ophthalmology;  Laterality: Left;  CDE:6.98   CATARACT EXTRACTION W/PHACO Right 11/17/2014   Procedure: CATARACT EXTRACTION PHACO AND INTRAOCULAR LENS PLACEMENT RIGHT EYE CDE=6.57;  Surgeon: Gemma Payor, MD;  Location: [facility];  Service: Ophthalmology;  Laterality: Right;   COLONOSCOPY  01/19/2011   Procedure: COLONOSCOPY;  Surgeon: Malissa Hippo, MD;  Location: [facility];  Service: Endoscopy;  Laterality: N/A;  2:00   COLONOSCOPY WITH PROPOFOL N/A 09/15/2021   Procedure: COLONOSCOPY WITH PROPOFOL;  Surgeon: Malissa Hippo, MD;  Location: [facility];  Service: Endoscopy;  Laterality: N/A;  1230 ASA 2   DILATION AND CURETTAGE OF UTERUS     ESOPHAGOGASTRODUODENOSCOPY  11/05/2010   Procedure: ESOPHAGOGASTRODUODENOSCOPY (EGD);  Surgeon: Malissa Hippo, MD;  Location: [facility];  Service: Endoscopy;  Laterality: N/A;  7:00 am per Melanie   ESOPHAGOGASTRODUODENOSCOPY N/A 07/25/2012   Procedure: ESOPHAGOGASTRODUODENOSCOPY (EGD);  Surgeon: Malissa Hippo, MD;  Location: [facility];  Service: Endoscopy;  Laterality: N/A;  400-moved to 420 Ann notified pt   ESOPHAGOGASTRODUODENOSCOPY N/A 05/27/2015   Procedure: ESOPHAGOGASTRODUODENOSCOPY (EGD);  Surgeon: Malissa Hippo, MD;  Location: [facility];  Service: Endoscopy;  Laterality: N/A;  2:00   HIATAL HERNIA REPAIR N/A 08/25/2015   Procedure: LAPAROSCOPIC REPAIR OF LARGE TYPE III  HIATAL HERNIA;  Surgeon: Luretha Murphy, MD;  Location: [facility];  Service: General;  Laterality: N/A;   LAPAROSCOPIC NISSEN FUNDOPLICATION N/A 06/17/2016   Procedure: ENDOSCOPY, HIATAL HERNIA REPAIR, AND TAKE DOWN OF NISSEN FUNDOPILICATION;  Surgeon: Luretha Murphy, MD;  Location: [facility];  Service: General;  Laterality: N/A;   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     TOTAL KNEE ARTHROPLASTY Right 07/27/2022   Procedure: RIGHT TOTAL KNEE ARTHROPLASTY;  Surgeon: [name], MD;  Location: [facility];  Service: Orthopedics;  Laterality: Right;  Needs RNFA   Family History  Problem Relation Age of Onset  Colon cancer Mother    Heart disease Mother    Arthritis Mother    Heart disease Father    Arthritis Father    Arthritis Sister    Arthritis Brother    Lung cancer Brother        smoker   Arthritis Maternal Aunt    Arthritis Maternal Uncle     Diabetes Maternal Uncle    Allergies as of 08/25/2022       Reactions   Hydromorphone Itching, Nausea Only, Other (See Comments)   After IV dilaudid pt flushed and felt dizzy   Ciprofloxacin Other (See Comments)   Stomach problems   Codeine Itching   Latex Other (See Comments)   fainted   Morphine And Codeine Itching   migraine   Nsaids Nausea And Vomiting   Penicillins Itching, Nausea And Vomiting   Has patient had a PCN reaction causing immediate rash, facial/tongue/throat swelling, SOB or lightheadedness with hypotension: No Has patient had a PCN reaction causing severe rash involving mucus membranes or skin necrosis: No Has patient had a PCN reaction that required hospitalization No Has patient had a PCN reaction occurring within the last 10 years: Yes If all of the above answers are "NO", then may proceed with Cephalosporin use.   Sulfa Antibiotics Nausea And Vomiting        Medication List        Accurate as of Aug 25, 2022 11:59 PM. If you have any questions, ask your nurse or doctor.          STOP taking these medications    amLODipine 2.5 MG tablet Commonly known as: NORVASC Stopped by: Felix Pacini, DO   aspirin EC 81 MG tablet Stopped by: Felix Pacini, DO   donepezil 10 MG disintegrating tablet Commonly known as: ARICEPT ODT Stopped by: Felix Pacini, DO   losartan 100 MG tablet Commonly known as: COZAAR Stopped by: Felix Pacini, DO   methocarbamol 500 MG tablet Commonly known as: ROBAXIN Stopped by: Felix Pacini, DO   oxyCODONE-acetaminophen 5-325 MG tablet Commonly known as: Percocet Stopped by: Felix Pacini, DO       TAKE these medications    albuterol 108 (90 Base) MCG/ACT inhaler Commonly known as: VENTOLIN HFA Inhale 1-2 puffs into the lungs every 6 (six) hours as needed for wheezing or shortness of breath.   atorvastatin 20 MG tablet Commonly known as: LIPITOR Take 1 tablet (20 mg total) by mouth daily.   b complex vitamins  capsule Take 1 capsule by mouth daily.   CLEAR EYES FOR DRY EYES OP Place 1 drop into both eyes daily as needed (dry eyes).   esomeprazole 40 MG capsule Commonly known as: NEXIUM Take 1 capsule (40 mg total) by mouth daily before breakfast.   famotidine 40 MG tablet Commonly known as: PEPCID Take 1 tablet (40 mg total) by mouth at bedtime.   levothyroxine 50 MCG tablet Commonly known as: SYNTHROID Take 1 tablet (50 mcg total) by mouth daily.   Melatonin 10 MG Tabs Take 20 mg by mouth at bedtime.   NON FORMULARY Apply 1 Application topically 2 (two) times daily. Hemp Cream   sertraline 100 MG tablet Commonly known as: ZOLOFT Take 1 tablet (100 mg total) by mouth at bedtime. What changed: how much to take   traZODone 50 MG tablet Commonly known as: DESYREL Take 0.5-1 tablets (25-50 mg total) by mouth at bedtime. Started by: Felix Pacini, DO        All past  medical history, surgical history, allergies, family history, immunizations and medications were updated in the EMR today and reviewed under the history and medication portions of their EMR.      ROS: Negative, with the exception of above mentioned in HPI   Objective:  BP 124/84   Pulse 90   Temp 98 F (36.7 C)   Wt 194 lb (88 kg)   SpO2 98%   BMI 29.50 kg/m  Body mass index is 29.5 kg/m. Physical Exam Vitals and nursing note reviewed.  Constitutional:      General: She is not in acute distress.    Appearance: Normal appearance. She is not ill-appearing, toxic-appearing or diaphoretic.  HENT:     Head: Normocephalic and atraumatic.  Eyes:     General: No scleral icterus.       Right eye: No discharge.        Left eye: No discharge.     Extraocular Movements: Extraocular movements intact.     Conjunctiva/sclera: Conjunctivae normal.     Pupils: Pupils are equal, round, and reactive to light.  Cardiovascular:     Rate and Rhythm: Normal rate and regular rhythm.  Pulmonary:     Effort: Pulmonary  effort is normal. No respiratory distress.     Breath sounds: Normal breath sounds. No wheezing, rhonchi or rales.  Musculoskeletal:     Right lower leg: No edema.     Left lower leg: No edema.  Skin:    General: Skin is warm.     Findings: No rash.  Neurological:     Mental Status: She is alert and oriented to person, place, and time. Mental status is at baseline.     Motor: No weakness.     Gait: Gait normal.  Psychiatric:        Mood and Affect: Mood normal.        Behavior: Behavior normal.        Thought Content: Thought content normal.        Judgment: Judgment normal.     Assessment/Plan: Debra Sanchez is a 82 y.o. female present for OV for Hospital discharge follow up Lightheadedness Not having as much lightheadedness episodes now.  Blood pressures were at the lower end of normal, possibly could have caused the lightheadedness versus her anemia or pain meds/sleep aid. - CBC w/Diff - IBC + Ferritin - Comp Met (CMET)  Anemia, blood loss - CBC w/Diff - IBC + Ferritin  Fall, initial encounter/Other closed intra-articular fracture of distal end of left radius, initial encounter Recovering well, following with orthopedics and had pins removed just this week. - Vitamin D (25 hydroxy) Status post total right knee replacement Well overall and working with physical therapy. Continue follow-up with orthopedics  Hyponatremia - Comp Met (CMET)  Diarrhea, unspecified type Diarrhea has resolved. She is taking a probiotic.  Anxiety/insomnia Encouraged her to increase back to her Zoloft 100 mg tablet.  She had cut back on her own, not realizing and this was prescribed for anxiety. She was taken over-the-counter medication for insomnia.  Possibly could be contributing to lightheadedness as well.  We discussed options today and she is willing to try trazodone 25-50 mg nightly. - sertraline (ZOLOFT) 100 MG tablet; Take 1 tablet (100 mg total) by mouth at bedtime.  Dispense:  90 tablet; Refill: 1  Primary hypertension/hyperlipidemia Blood pressures are currently normal without medications. Patient has declined to restart her statin We discussed monitoring closely for pressures routinely above 140 systolics.  as long as remains controlled, no medications required at this time (had been on losartan 100 mg and amlodipine  2.5 mg twice daily).   Reviewed expectations re: course of current medical issues. Discussed self-management of symptoms. Outlined signs and symptoms indicating need for more acute intervention. Patient verbalized understanding and all questions were answered. Patient received an After-Visit Summary. Any changes in medications were reviewed and patient was provided with updated med list with their AVS.     Orders Placed This Encounter  Procedures   CBC w/Diff   IBC + Ferritin   Comp Met (CMET)   Vitamin D (25 hydroxy)    50 minutes spent in pt encounter on day of service.   Note is dictated utilizing voice recognition software. Although note has been proof read prior to signing, occasional typographical errors still can be missed. If any questions arise, please do not hesitate to call for verification.   electronically signed by:  Felix Pacini, DO  [facility] [place]

## 2022-08-26 ENCOUNTER — Telehealth: Payer: Self-pay | Admitting: Family Medicine

## 2022-08-26 ENCOUNTER — Telehealth: Payer: Self-pay | Admitting: Orthopaedic Surgery

## 2022-08-26 DIAGNOSIS — Z96651 Presence of right artificial knee joint: Secondary | ICD-10-CM | POA: Diagnosis not present

## 2022-08-26 DIAGNOSIS — Z471 Aftercare following joint replacement surgery: Secondary | ICD-10-CM | POA: Diagnosis not present

## 2022-08-26 DIAGNOSIS — Z981 Arthrodesis status: Secondary | ICD-10-CM | POA: Diagnosis not present

## 2022-08-26 DIAGNOSIS — Z9181 History of falling: Secondary | ICD-10-CM | POA: Diagnosis not present

## 2022-08-26 DIAGNOSIS — M5135 Other intervertebral disc degeneration, thoracolumbar region: Secondary | ICD-10-CM | POA: Diagnosis not present

## 2022-08-26 DIAGNOSIS — M1712 Unilateral primary osteoarthritis, left knee: Secondary | ICD-10-CM | POA: Diagnosis not present

## 2022-08-26 DIAGNOSIS — I10 Essential (primary) hypertension: Secondary | ICD-10-CM | POA: Diagnosis not present

## 2022-08-26 DIAGNOSIS — E782 Mixed hyperlipidemia: Secondary | ICD-10-CM | POA: Diagnosis not present

## 2022-08-26 DIAGNOSIS — G43909 Migraine, unspecified, not intractable, without status migrainosus: Secondary | ICD-10-CM | POA: Diagnosis not present

## 2022-08-26 DIAGNOSIS — Z7982 Long term (current) use of aspirin: Secondary | ICD-10-CM | POA: Diagnosis not present

## 2022-08-26 DIAGNOSIS — D649 Anemia, unspecified: Secondary | ICD-10-CM | POA: Diagnosis not present

## 2022-08-26 DIAGNOSIS — K219 Gastro-esophageal reflux disease without esophagitis: Secondary | ICD-10-CM | POA: Diagnosis not present

## 2022-08-26 DIAGNOSIS — G8929 Other chronic pain: Secondary | ICD-10-CM | POA: Diagnosis not present

## 2022-08-26 DIAGNOSIS — Z86010 Personal history of colonic polyps: Secondary | ICD-10-CM | POA: Diagnosis not present

## 2022-08-26 DIAGNOSIS — M810 Age-related osteoporosis without current pathological fracture: Secondary | ICD-10-CM | POA: Diagnosis not present

## 2022-08-26 DIAGNOSIS — E039 Hypothyroidism, unspecified: Secondary | ICD-10-CM | POA: Diagnosis not present

## 2022-08-26 DIAGNOSIS — G4700 Insomnia, unspecified: Secondary | ICD-10-CM | POA: Diagnosis not present

## 2022-08-26 NOTE — Telephone Encounter (Signed)
Spoke with patient regarding results/recommendations.  

## 2022-08-26 NOTE — Telephone Encounter (Signed)
Ok for orders? 

## 2022-08-26 NOTE — Telephone Encounter (Signed)
I called Debra Sanchez and advised. 

## 2022-08-26 NOTE — Telephone Encounter (Signed)
Please call patient Liver, kidney functions are normal Red Blood cell counts are improving and almost back to normal.   Iron levels are low - she could benefit from taking an iron supplement daily for 4 weeks if she can tolerate it.    - recommend ferrous sulfate 325 mg qd OTC- with a stool softener if causes constipation  Vit d levels are midly below normal level. She could benefit from adding  vit d3 1000u daily supplement- OTC

## 2022-08-26 NOTE — Telephone Encounter (Signed)
Debra Sanchez (PT) from [facility] called requesting verbal orders for hom health pt 1wk 1, and 2wk 4. Please call Debra Sanchez on secure line at [phone].

## 2022-08-29 ENCOUNTER — Telehealth: Payer: Self-pay | Admitting: Orthopaedic Surgery

## 2022-08-29 DIAGNOSIS — M25532 Pain in left wrist: Secondary | ICD-10-CM | POA: Diagnosis not present

## 2022-08-29 NOTE — Telephone Encounter (Signed)
Debra Sanchez (PT) from [facility] called requesting a call back. Is pt cleared to shower. Please call Debra Sanchez on secure line at [phone].

## 2022-08-29 NOTE — Telephone Encounter (Signed)
noted 

## 2022-08-29 NOTE — Telephone Encounter (Signed)
Please advise. Patient is far enough out from [place] for shower, however, saw PCP last week and felt faint after bathing and again in office.

## 2022-08-31 DIAGNOSIS — D649 Anemia, unspecified: Secondary | ICD-10-CM | POA: Diagnosis not present

## 2022-08-31 DIAGNOSIS — G4700 Insomnia, unspecified: Secondary | ICD-10-CM | POA: Diagnosis not present

## 2022-08-31 DIAGNOSIS — I10 Essential (primary) hypertension: Secondary | ICD-10-CM | POA: Diagnosis not present

## 2022-08-31 DIAGNOSIS — K219 Gastro-esophageal reflux disease without esophagitis: Secondary | ICD-10-CM | POA: Diagnosis not present

## 2022-08-31 DIAGNOSIS — M5135 Other intervertebral disc degeneration, thoracolumbar region: Secondary | ICD-10-CM | POA: Diagnosis not present

## 2022-08-31 DIAGNOSIS — M1712 Unilateral primary osteoarthritis, left knee: Secondary | ICD-10-CM | POA: Diagnosis not present

## 2022-08-31 DIAGNOSIS — Z96651 Presence of right artificial knee joint: Secondary | ICD-10-CM | POA: Diagnosis not present

## 2022-08-31 DIAGNOSIS — E039 Hypothyroidism, unspecified: Secondary | ICD-10-CM | POA: Diagnosis not present

## 2022-08-31 DIAGNOSIS — Z981 Arthrodesis status: Secondary | ICD-10-CM | POA: Diagnosis not present

## 2022-08-31 DIAGNOSIS — Z86010 Personal history of colonic polyps: Secondary | ICD-10-CM | POA: Diagnosis not present

## 2022-08-31 DIAGNOSIS — E782 Mixed hyperlipidemia: Secondary | ICD-10-CM | POA: Diagnosis not present

## 2022-08-31 DIAGNOSIS — Z471 Aftercare following joint replacement surgery: Secondary | ICD-10-CM | POA: Diagnosis not present

## 2022-08-31 DIAGNOSIS — M810 Age-related osteoporosis without current pathological fracture: Secondary | ICD-10-CM | POA: Diagnosis not present

## 2022-08-31 DIAGNOSIS — G8929 Other chronic pain: Secondary | ICD-10-CM | POA: Diagnosis not present

## 2022-08-31 DIAGNOSIS — G43909 Migraine, unspecified, not intractable, without status migrainosus: Secondary | ICD-10-CM | POA: Diagnosis not present

## 2022-08-31 DIAGNOSIS — Z7982 Long term (current) use of aspirin: Secondary | ICD-10-CM | POA: Diagnosis not present

## 2022-08-31 DIAGNOSIS — Z9181 History of falling: Secondary | ICD-10-CM | POA: Diagnosis not present

## 2022-09-01 ENCOUNTER — Ambulatory Visit (INDEPENDENT_AMBULATORY_CARE_PROVIDER_SITE_OTHER): Payer: Medicare Other | Admitting: Gastroenterology

## 2022-09-02 ENCOUNTER — Telehealth: Payer: Self-pay | Admitting: Orthopaedic Surgery

## 2022-09-02 NOTE — Telephone Encounter (Signed)
Mark from [facility] called stated pt declined care today and yesterday

## 2022-09-07 NOTE — Telephone Encounter (Signed)
Home health orders received 09/05/22 for Debra Sanchez [facility] health initiation orders: Yes.  Home health re-certification orders: No. Patient last seen by ordering physician for this condition: 08/25/22. Must be less than 90 days for re-certification and less than 30 days prior for initiation. Visit must have been for the condition the orders are being placed.  Patient meets criteria for Physician to sign orders: Yes.        Current med list has been attached: Yes        Orders placed on physicians desk for signature: 09/07/22 (date) If patient does not meet criteria for orders to be signed: pt was called to schedule appt. Appt is scheduled for n/a.   [name]

## 2022-09-08 ENCOUNTER — Other Ambulatory Visit: Payer: Self-pay

## 2022-09-08 ENCOUNTER — Encounter: Payer: Self-pay | Admitting: Orthopaedic Surgery

## 2022-09-08 ENCOUNTER — Ambulatory Visit (INDEPENDENT_AMBULATORY_CARE_PROVIDER_SITE_OTHER): Payer: Medicare Other | Admitting: Orthopaedic Surgery

## 2022-09-08 VITALS — Ht 68.0 in | Wt 194.0 lb

## 2022-09-08 DIAGNOSIS — M25561 Pain in right knee: Secondary | ICD-10-CM

## 2022-09-08 DIAGNOSIS — Z96651 Presence of right artificial knee joint: Secondary | ICD-10-CM

## 2022-09-08 NOTE — Progress Notes (Signed)
Post-Op Visit Note   Patient: Debra Sanchez           Date of Birth: 02-04-1941           MRN: [id number] Visit Date: 09/08/2022 PCP: Natalia Leatherwood, DO   Assessment & Plan: Postop total knee arthroplasty right on 07/27/2022.  Few days ago she dropped a pill reach down to get it lost her balance fell on her right side.  She has had some soreness posteriorly.  In her calf.  She has used ice.  She already had flexion past 120 degrees.  Knee incision looks good.  Ambulation with platform walker due to postop fall with wrist fracture treated in [place] with volar plate surgery.  Chief Complaint:  Chief Complaint  Patient presents with   Right Knee - Routine Post Op, Follow-up     07/27/2022 Right TKA     Visit Diagnoses:  1. Acute pain of right knee   2. S/P TKR (total knee replacement), right     Plan: Return 4 weeks no x-ray needed on return.  Follow-Up Instructions: No follow-ups on file.   Orders:  Orders Placed This Encounter  Procedures   XR Knee 1-2 Views Right   No orders of the defined types were placed in this encounter.   Imaging: No results found.  PMFS History: Patient Active Problem List   Diagnosis Date Noted   Hyponatremia 08/25/2022   Closed fracture of left distal radius 08/25/2022   Fall 08/25/2022   Lightheadedness 08/25/2022   Status post total right knee replacement 07/27/2022   Palpitations 05/16/2022   Carotid bruit 05/16/2022   Increase in creatinine 04/28/2022   Memory changes 10/19/2021   Meningioma (HCC), 8 mm right temporal 03/18/2021   Esophageal dysphagia 10/13/2020   Continuous leakage of urine 03/18/2020   Mixed hyperlipidemia 03/19/2019   GERD (gastroesophageal reflux disease) 12/18/2018   Anxiety 10/22/2018   Persistent headaches 11/18/2016   Hiatal hernia with GERD 06/17/2016   Status post laparoscopic Nissen fundoplication 08/25/2015   Pulmonary nodules 05/12/2015   Hypertension 05/15/2012   Hypothyroidism 05/15/2012    Anemia, blood loss 05/15/2012   Past Medical History:  Diagnosis Date   Anemia    HX from bleeding ulcer. 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   Pneumonia yrs ago   Syncope 2012   Wears glasses     Family History  Problem Relation Age of Onset   Colon cancer Mother    Heart disease Mother    Arthritis Mother    Heart disease Father    Arthritis Father    Arthritis Sister    Arthritis Brother    Lung cancer Brother        smoker   Arthritis  Maternal Aunt    Arthritis Maternal Uncle    Diabetes Maternal Uncle     Past Surgical History:  Procedure Laterality Date   ABDOMINAL HYSTERECTOMY     ANTERIOR CERVICAL DECOMP/DISCECTOMY FUSION     C5-6 and C6-7 anterior cervical diskectomy and fusion    BILATERAL OOPHORECTOMY     CATARACT EXTRACTION W/PHACO Left 10/20/2014   Procedure: CATARACT EXTRACTION PHACO AND INTRAOCULAR LENS PLACEMENT LEFT EYE;  Surgeon: Gemma Payor, MD;  Location: [facility];  Service: Ophthalmology;  Laterality: Left;  CDE:6.98   CATARACT EXTRACTION W/PHACO Right 11/17/2014   Procedure: CATARACT EXTRACTION PHACO AND INTRAOCULAR LENS PLACEMENT RIGHT EYE CDE=6.57;  Surgeon: Gemma Payor, MD;  Location: [facility];  Service: Ophthalmology;  Laterality: Right;   COLONOSCOPY  01/19/2011   Procedure: COLONOSCOPY;  Surgeon: Malissa Hippo, MD;  [facility];  Service: Endoscopy;   Laterality: N/A;  2:00   COLONOSCOPY WITH PROPOFOL N/A 09/15/2021   Procedure: COLONOSCOPY WITH PROPOFOL;  Surgeon: Malissa Hippo, MD;  [facility];  Service: Endoscopy;  Laterality: N/A;  1230 ASA 2   DILATION AND CURETTAGE OF UTERUS     ESOPHAGOGASTRODUODENOSCOPY  11/05/2010   Procedure: ESOPHAGOGASTRODUODENOSCOPY (EGD);  Surgeon: Malissa Hippo, MD;  [facility];  Service: Endoscopy;  Laterality: N/A;  7:00 am per Melanie   ESOPHAGOGASTRODUODENOSCOPY N/A 07/25/2012   Procedure: ESOPHAGOGASTRODUODENOSCOPY (EGD);  Surgeon: Malissa Hippo, MD;  [facility];  Service: Endoscopy;  Laterality: N/A;  400-moved to 420 Ann notified pt   ESOPHAGOGASTRODUODENOSCOPY N/A 05/27/2015   Procedure: ESOPHAGOGASTRODUODENOSCOPY (EGD);  Surgeon: Malissa Hippo, MD;  [facility];  Service: Endoscopy;  Laterality: N/A;  2:00   HIATAL HERNIA REPAIR N/A 08/25/2015   Procedure: LAPAROSCOPIC REPAIR OF LARGE TYPE III  HIATAL HERNIA;  Surgeon: Luretha Murphy, MD;  [facility]: [facility];  Service: General;  Laterality: N/A;   LAPAROSCOPIC NISSEN FUNDOPLICATION N/A 06/17/2016   Procedure: ENDOSCOPY, HIATAL HERNIA REPAIR, AND TAKE DOWN OF NISSEN FUNDOPILICATION;  Surgeon: Luretha Murphy, MD;  [facility]: [facility];  Service: General;  Laterality: N/A;   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

## 2022-09-09 DIAGNOSIS — E782 Mixed hyperlipidemia: Secondary | ICD-10-CM | POA: Diagnosis not present

## 2022-09-09 DIAGNOSIS — Z96651 Presence of right artificial knee joint: Secondary | ICD-10-CM | POA: Diagnosis not present

## 2022-09-09 DIAGNOSIS — M1712 Unilateral primary osteoarthritis, left knee: Secondary | ICD-10-CM | POA: Diagnosis not present

## 2022-09-09 DIAGNOSIS — I10 Essential (primary) hypertension: Secondary | ICD-10-CM | POA: Diagnosis not present

## 2022-09-09 DIAGNOSIS — M810 Age-related osteoporosis without current pathological fracture: Secondary | ICD-10-CM | POA: Diagnosis not present

## 2022-09-09 DIAGNOSIS — Z9181 History of falling: Secondary | ICD-10-CM | POA: Diagnosis not present

## 2022-09-09 DIAGNOSIS — D649 Anemia, unspecified: Secondary | ICD-10-CM | POA: Diagnosis not present

## 2022-09-09 DIAGNOSIS — M5135 Other intervertebral disc degeneration, thoracolumbar region: Secondary | ICD-10-CM | POA: Diagnosis not present

## 2022-09-09 DIAGNOSIS — K219 Gastro-esophageal reflux disease without esophagitis: Secondary | ICD-10-CM | POA: Diagnosis not present

## 2022-09-09 DIAGNOSIS — G4700 Insomnia, unspecified: Secondary | ICD-10-CM | POA: Diagnosis not present

## 2022-09-09 DIAGNOSIS — Z471 Aftercare following joint replacement surgery: Secondary | ICD-10-CM | POA: Diagnosis not present

## 2022-09-09 DIAGNOSIS — E039 Hypothyroidism, unspecified: Secondary | ICD-10-CM | POA: Diagnosis not present

## 2022-09-09 DIAGNOSIS — Z981 Arthrodesis status: Secondary | ICD-10-CM | POA: Diagnosis not present

## 2022-09-09 DIAGNOSIS — Z7982 Long term (current) use of aspirin: Secondary | ICD-10-CM | POA: Diagnosis not present

## 2022-09-09 DIAGNOSIS — Z86010 Personal history of colonic polyps: Secondary | ICD-10-CM | POA: Diagnosis not present

## 2022-09-09 DIAGNOSIS — G8929 Other chronic pain: Secondary | ICD-10-CM | POA: Diagnosis not present

## 2022-09-09 DIAGNOSIS — G43909 Migraine, unspecified, not intractable, without status migrainosus: Secondary | ICD-10-CM | POA: Diagnosis not present

## 2022-09-12 DIAGNOSIS — M810 Age-related osteoporosis without current pathological fracture: Secondary | ICD-10-CM | POA: Diagnosis not present

## 2022-09-12 DIAGNOSIS — Z981 Arthrodesis status: Secondary | ICD-10-CM | POA: Diagnosis not present

## 2022-09-12 DIAGNOSIS — E782 Mixed hyperlipidemia: Secondary | ICD-10-CM | POA: Diagnosis not present

## 2022-09-12 DIAGNOSIS — G43909 Migraine, unspecified, not intractable, without status migrainosus: Secondary | ICD-10-CM | POA: Diagnosis not present

## 2022-09-12 DIAGNOSIS — M1712 Unilateral primary osteoarthritis, left knee: Secondary | ICD-10-CM | POA: Diagnosis not present

## 2022-09-12 DIAGNOSIS — D649 Anemia, unspecified: Secondary | ICD-10-CM | POA: Diagnosis not present

## 2022-09-12 DIAGNOSIS — Z86010 Personal history of colonic polyps: Secondary | ICD-10-CM | POA: Diagnosis not present

## 2022-09-12 DIAGNOSIS — G8929 Other chronic pain: Secondary | ICD-10-CM | POA: Diagnosis not present

## 2022-09-12 DIAGNOSIS — Z7982 Long term (current) use of aspirin: Secondary | ICD-10-CM | POA: Diagnosis not present

## 2022-09-12 DIAGNOSIS — M5135 Other intervertebral disc degeneration, thoracolumbar region: Secondary | ICD-10-CM | POA: Diagnosis not present

## 2022-09-12 DIAGNOSIS — Z471 Aftercare following joint replacement surgery: Secondary | ICD-10-CM | POA: Diagnosis not present

## 2022-09-12 DIAGNOSIS — K219 Gastro-esophageal reflux disease without esophagitis: Secondary | ICD-10-CM | POA: Diagnosis not present

## 2022-09-12 DIAGNOSIS — Z9181 History of falling: Secondary | ICD-10-CM | POA: Diagnosis not present

## 2022-09-12 DIAGNOSIS — M25532 Pain in left wrist: Secondary | ICD-10-CM | POA: Diagnosis not present

## 2022-09-12 DIAGNOSIS — G4700 Insomnia, unspecified: Secondary | ICD-10-CM | POA: Diagnosis not present

## 2022-09-12 DIAGNOSIS — E039 Hypothyroidism, unspecified: Secondary | ICD-10-CM | POA: Diagnosis not present

## 2022-09-12 DIAGNOSIS — Z96651 Presence of right artificial knee joint: Secondary | ICD-10-CM | POA: Diagnosis not present

## 2022-09-12 DIAGNOSIS — I10 Essential (primary) hypertension: Secondary | ICD-10-CM | POA: Diagnosis not present

## 2022-09-12 NOTE — Telephone Encounter (Signed)
Signed and returned to CMA work basket 

## 2022-09-12 NOTE — Telephone Encounter (Signed)
Had already been completed- duplicate

## 2022-09-12 NOTE — Telephone Encounter (Signed)
Home health orders received 09/05/22 for Kaede H Dasch Home health initiation orders: Yes.  Home health re-certification orders: No. Patient last seen by ordering physician for this condition: 08/25/22. Must be less than 90 days for re-certification and less than 30 days prior for initiation. Visit must have been for the condition the orders are being placed.  Patient meets criteria for Physician to sign orders: Yes.        Current med list has been attached: Yes        Orders placed on physicians desk for signature: 09/07/22 (date) If patient does not meet criteria for orders to be signed: pt was called to schedule appt. Appt is scheduled for n/a.   Gurinder Toral W Smith  

## 2022-09-16 DIAGNOSIS — Z9181 History of falling: Secondary | ICD-10-CM | POA: Diagnosis not present

## 2022-09-16 DIAGNOSIS — Z471 Aftercare following joint replacement surgery: Secondary | ICD-10-CM | POA: Diagnosis not present

## 2022-09-16 DIAGNOSIS — Z86010 Personal history of colonic polyps: Secondary | ICD-10-CM | POA: Diagnosis not present

## 2022-09-16 DIAGNOSIS — M1712 Unilateral primary osteoarthritis, left knee: Secondary | ICD-10-CM | POA: Diagnosis not present

## 2022-09-16 DIAGNOSIS — K219 Gastro-esophageal reflux disease without esophagitis: Secondary | ICD-10-CM | POA: Diagnosis not present

## 2022-09-16 DIAGNOSIS — E039 Hypothyroidism, unspecified: Secondary | ICD-10-CM | POA: Diagnosis not present

## 2022-09-16 DIAGNOSIS — Z96651 Presence of right artificial knee joint: Secondary | ICD-10-CM | POA: Diagnosis not present

## 2022-09-16 DIAGNOSIS — M810 Age-related osteoporosis without current pathological fracture: Secondary | ICD-10-CM | POA: Diagnosis not present

## 2022-09-16 DIAGNOSIS — M5135 Other intervertebral disc degeneration, thoracolumbar region: Secondary | ICD-10-CM | POA: Diagnosis not present

## 2022-09-16 DIAGNOSIS — G4700 Insomnia, unspecified: Secondary | ICD-10-CM | POA: Diagnosis not present

## 2022-09-16 DIAGNOSIS — D649 Anemia, unspecified: Secondary | ICD-10-CM | POA: Diagnosis not present

## 2022-09-16 DIAGNOSIS — G43909 Migraine, unspecified, not intractable, without status migrainosus: Secondary | ICD-10-CM | POA: Diagnosis not present

## 2022-09-16 DIAGNOSIS — I10 Essential (primary) hypertension: Secondary | ICD-10-CM | POA: Diagnosis not present

## 2022-09-16 DIAGNOSIS — Z981 Arthrodesis status: Secondary | ICD-10-CM | POA: Diagnosis not present

## 2022-09-16 DIAGNOSIS — E782 Mixed hyperlipidemia: Secondary | ICD-10-CM | POA: Diagnosis not present

## 2022-09-16 DIAGNOSIS — Z7982 Long term (current) use of aspirin: Secondary | ICD-10-CM | POA: Diagnosis not present

## 2022-09-16 DIAGNOSIS — G8929 Other chronic pain: Secondary | ICD-10-CM | POA: Diagnosis not present

## 2022-09-20 DIAGNOSIS — I10 Essential (primary) hypertension: Secondary | ICD-10-CM | POA: Diagnosis not present

## 2022-09-20 DIAGNOSIS — G43909 Migraine, unspecified, not intractable, without status migrainosus: Secondary | ICD-10-CM | POA: Diagnosis not present

## 2022-09-20 DIAGNOSIS — Z9181 History of falling: Secondary | ICD-10-CM | POA: Diagnosis not present

## 2022-09-20 DIAGNOSIS — D649 Anemia, unspecified: Secondary | ICD-10-CM | POA: Diagnosis not present

## 2022-09-20 DIAGNOSIS — K219 Gastro-esophageal reflux disease without esophagitis: Secondary | ICD-10-CM | POA: Diagnosis not present

## 2022-09-20 DIAGNOSIS — M810 Age-related osteoporosis without current pathological fracture: Secondary | ICD-10-CM | POA: Diagnosis not present

## 2022-09-20 DIAGNOSIS — G8929 Other chronic pain: Secondary | ICD-10-CM | POA: Diagnosis not present

## 2022-09-20 DIAGNOSIS — Z981 Arthrodesis status: Secondary | ICD-10-CM | POA: Diagnosis not present

## 2022-09-20 DIAGNOSIS — Z86010 Personal history of colonic polyps: Secondary | ICD-10-CM | POA: Diagnosis not present

## 2022-09-20 DIAGNOSIS — E782 Mixed hyperlipidemia: Secondary | ICD-10-CM | POA: Diagnosis not present

## 2022-09-20 DIAGNOSIS — Z96651 Presence of right artificial knee joint: Secondary | ICD-10-CM | POA: Diagnosis not present

## 2022-09-20 DIAGNOSIS — Z471 Aftercare following joint replacement surgery: Secondary | ICD-10-CM | POA: Diagnosis not present

## 2022-09-20 DIAGNOSIS — Z7982 Long term (current) use of aspirin: Secondary | ICD-10-CM | POA: Diagnosis not present

## 2022-09-20 DIAGNOSIS — G4700 Insomnia, unspecified: Secondary | ICD-10-CM | POA: Diagnosis not present

## 2022-09-20 DIAGNOSIS — M5135 Other intervertebral disc degeneration, thoracolumbar region: Secondary | ICD-10-CM | POA: Diagnosis not present

## 2022-09-20 DIAGNOSIS — M1712 Unilateral primary osteoarthritis, left knee: Secondary | ICD-10-CM | POA: Diagnosis not present

## 2022-09-20 DIAGNOSIS — E039 Hypothyroidism, unspecified: Secondary | ICD-10-CM | POA: Diagnosis not present

## 2022-09-23 ENCOUNTER — Telehealth: Payer: Self-pay

## 2022-09-23 DIAGNOSIS — G8929 Other chronic pain: Secondary | ICD-10-CM | POA: Diagnosis not present

## 2022-09-23 DIAGNOSIS — K219 Gastro-esophageal reflux disease without esophagitis: Secondary | ICD-10-CM | POA: Diagnosis not present

## 2022-09-23 DIAGNOSIS — M1712 Unilateral primary osteoarthritis, left knee: Secondary | ICD-10-CM | POA: Diagnosis not present

## 2022-09-23 DIAGNOSIS — Z7982 Long term (current) use of aspirin: Secondary | ICD-10-CM | POA: Diagnosis not present

## 2022-09-23 DIAGNOSIS — M5135 Other intervertebral disc degeneration, thoracolumbar region: Secondary | ICD-10-CM | POA: Diagnosis not present

## 2022-09-23 DIAGNOSIS — I10 Essential (primary) hypertension: Secondary | ICD-10-CM | POA: Diagnosis not present

## 2022-09-23 DIAGNOSIS — Z9181 History of falling: Secondary | ICD-10-CM | POA: Diagnosis not present

## 2022-09-23 DIAGNOSIS — Z981 Arthrodesis status: Secondary | ICD-10-CM | POA: Diagnosis not present

## 2022-09-23 DIAGNOSIS — M810 Age-related osteoporosis without current pathological fracture: Secondary | ICD-10-CM | POA: Diagnosis not present

## 2022-09-23 DIAGNOSIS — E039 Hypothyroidism, unspecified: Secondary | ICD-10-CM | POA: Diagnosis not present

## 2022-09-23 DIAGNOSIS — G43909 Migraine, unspecified, not intractable, without status migrainosus: Secondary | ICD-10-CM | POA: Diagnosis not present

## 2022-09-23 DIAGNOSIS — Z86010 Personal history of colonic polyps: Secondary | ICD-10-CM | POA: Diagnosis not present

## 2022-09-23 DIAGNOSIS — Z471 Aftercare following joint replacement surgery: Secondary | ICD-10-CM | POA: Diagnosis not present

## 2022-09-23 DIAGNOSIS — Z96651 Presence of right artificial knee joint: Secondary | ICD-10-CM | POA: Diagnosis not present

## 2022-09-23 DIAGNOSIS — G4700 Insomnia, unspecified: Secondary | ICD-10-CM | POA: Diagnosis not present

## 2022-09-23 DIAGNOSIS — E782 Mixed hyperlipidemia: Secondary | ICD-10-CM | POA: Diagnosis not present

## 2022-09-23 DIAGNOSIS — D649 Anemia, unspecified: Secondary | ICD-10-CM | POA: Diagnosis not present

## 2022-09-23 NOTE — Telephone Encounter (Signed)
[place] [facility] [phone] wanted to give an FYI on this pt she has completed her in home PT and pt has declined outpatient services. She has ROM 0-122. She did says she had a knot at her calf that has now resolved there is no redness no swelling and no c/ o pain. BP was 156/68 pre visit and 150-/80 post visit  can call if there are any questions.

## 2022-09-26 ENCOUNTER — Telehealth: Payer: Self-pay | Admitting: Radiology

## 2022-09-26 ENCOUNTER — Other Ambulatory Visit: Payer: Self-pay | Admitting: Orthopaedic Surgery

## 2022-09-26 DIAGNOSIS — M25632 Stiffness of left wrist, not elsewhere classified: Secondary | ICD-10-CM | POA: Diagnosis not present

## 2022-09-26 DIAGNOSIS — M25432 Effusion, left wrist: Secondary | ICD-10-CM | POA: Diagnosis not present

## 2022-09-26 DIAGNOSIS — Z96651 Presence of right artificial knee joint: Secondary | ICD-10-CM

## 2022-09-26 DIAGNOSIS — M25642 Stiffness of left hand, not elsewhere classified: Secondary | ICD-10-CM | POA: Diagnosis not present

## 2022-09-26 DIAGNOSIS — R531 Weakness: Secondary | ICD-10-CM | POA: Diagnosis not present

## 2022-09-26 DIAGNOSIS — M25532 Pain in left wrist: Secondary | ICD-10-CM | POA: Diagnosis not present

## 2022-09-26 MED ORDER — TRAMADOL HCL 50 MG PO TABS: 50.0000 mg | ORAL_TABLET | Freq: Four times a day (QID) | ORAL | 0 refills | Status: DC | PRN

## 2022-09-26 NOTE — Telephone Encounter (Signed)
Referral entered for [facility] in [place] on [address] as patient is having PT there currently for wrist fracture.

## 2022-09-26 NOTE — Telephone Encounter (Signed)
Received fax request from [facility] requesting refill on Oxycodone 5/325.  Please advise.

## 2022-09-26 NOTE — Addendum Note (Signed)
Addended by: Rogers Seeds on: 09/26/2022 11:40 AM   Modules accepted: Orders

## 2022-10-03 ENCOUNTER — Other Ambulatory Visit: Payer: Self-pay | Admitting: Family Medicine

## 2022-10-03 DIAGNOSIS — M25632 Stiffness of left wrist, not elsewhere classified: Secondary | ICD-10-CM | POA: Diagnosis not present

## 2022-10-03 DIAGNOSIS — M25432 Effusion, left wrist: Secondary | ICD-10-CM | POA: Diagnosis not present

## 2022-10-03 DIAGNOSIS — M25532 Pain in left wrist: Secondary | ICD-10-CM | POA: Diagnosis not present

## 2022-10-03 DIAGNOSIS — R531 Weakness: Secondary | ICD-10-CM | POA: Diagnosis not present

## 2022-10-03 DIAGNOSIS — M25642 Stiffness of left hand, not elsewhere classified: Secondary | ICD-10-CM | POA: Diagnosis not present

## 2022-10-03 DIAGNOSIS — I10 Essential (primary) hypertension: Secondary | ICD-10-CM

## 2022-10-06 ENCOUNTER — Encounter: Payer: Medicare Other | Admitting: Orthopaedic Surgery

## 2022-10-07 DIAGNOSIS — M25632 Stiffness of left wrist, not elsewhere classified: Secondary | ICD-10-CM | POA: Diagnosis not present

## 2022-10-07 DIAGNOSIS — M25432 Effusion, left wrist: Secondary | ICD-10-CM | POA: Diagnosis not present

## 2022-10-07 DIAGNOSIS — M25642 Stiffness of left hand, not elsewhere classified: Secondary | ICD-10-CM | POA: Diagnosis not present

## 2022-10-07 DIAGNOSIS — R531 Weakness: Secondary | ICD-10-CM | POA: Diagnosis not present

## 2022-10-07 DIAGNOSIS — M25532 Pain in left wrist: Secondary | ICD-10-CM | POA: Diagnosis not present

## 2022-10-10 DIAGNOSIS — R531 Weakness: Secondary | ICD-10-CM | POA: Diagnosis not present

## 2022-10-10 DIAGNOSIS — M25642 Stiffness of left hand, not elsewhere classified: Secondary | ICD-10-CM | POA: Diagnosis not present

## 2022-10-10 DIAGNOSIS — M25632 Stiffness of left wrist, not elsewhere classified: Secondary | ICD-10-CM | POA: Diagnosis not present

## 2022-10-10 DIAGNOSIS — M25432 Effusion, left wrist: Secondary | ICD-10-CM | POA: Diagnosis not present

## 2022-10-10 DIAGNOSIS — M25532 Pain in left wrist: Secondary | ICD-10-CM | POA: Diagnosis not present

## 2022-10-20 ENCOUNTER — Ambulatory Visit (INDEPENDENT_AMBULATORY_CARE_PROVIDER_SITE_OTHER): Payer: Medicare Other | Admitting: Gastroenterology

## 2022-10-24 DIAGNOSIS — M25432 Effusion, left wrist: Secondary | ICD-10-CM | POA: Diagnosis not present

## 2022-10-24 DIAGNOSIS — M25642 Stiffness of left hand, not elsewhere classified: Secondary | ICD-10-CM | POA: Diagnosis not present

## 2022-10-24 DIAGNOSIS — M25632 Stiffness of left wrist, not elsewhere classified: Secondary | ICD-10-CM | POA: Diagnosis not present

## 2022-10-24 DIAGNOSIS — M25532 Pain in left wrist: Secondary | ICD-10-CM | POA: Diagnosis not present

## 2022-10-24 DIAGNOSIS — R531 Weakness: Secondary | ICD-10-CM | POA: Diagnosis not present

## 2022-10-26 ENCOUNTER — Encounter: Payer: Self-pay | Admitting: Family Medicine

## 2022-10-26 ENCOUNTER — Ambulatory Visit: Payer: Medicare Other | Admitting: Family Medicine

## 2022-10-26 VITALS — BP 128/82 | HR 76 | Temp 97.5°F | Wt 191.4 lb

## 2022-10-26 DIAGNOSIS — K219 Gastro-esophageal reflux disease without esophagitis: Secondary | ICD-10-CM | POA: Diagnosis not present

## 2022-10-26 DIAGNOSIS — E782 Mixed hyperlipidemia: Secondary | ICD-10-CM | POA: Diagnosis not present

## 2022-10-26 DIAGNOSIS — K449 Diaphragmatic hernia without obstruction or gangrene: Secondary | ICD-10-CM

## 2022-10-26 DIAGNOSIS — F419 Anxiety disorder, unspecified: Secondary | ICD-10-CM | POA: Diagnosis not present

## 2022-10-26 DIAGNOSIS — I10 Essential (primary) hypertension: Secondary | ICD-10-CM

## 2022-10-26 DIAGNOSIS — E034 Atrophy of thyroid (acquired): Secondary | ICD-10-CM

## 2022-10-26 MED ORDER — DONEPEZIL HCL 10 MG PO TBDP: 10.0000 mg | ORAL_TABLET | Freq: Every day | ORAL | 1 refills | Status: DC

## 2022-10-26 MED ORDER — FAMOTIDINE 40 MG PO TABS: 40.0000 mg | ORAL_TABLET | Freq: Every day | ORAL | 3 refills | Status: AC

## 2022-10-26 MED ORDER — ESOMEPRAZOLE MAGNESIUM 40 MG PO CPDR: 40.0000 mg | DELAYED_RELEASE_CAPSULE | Freq: Every day | ORAL | 3 refills | Status: AC

## 2022-10-26 MED ORDER — LEVOTHYROXINE SODIUM 50 MCG PO TABS: 50.0000 ug | ORAL_TABLET | Freq: Every day | ORAL | 3 refills | Status: DC

## 2022-10-26 MED ORDER — LOSARTAN POTASSIUM 100 MG PO TABS
100.0000 mg | ORAL_TABLET | Freq: Every day | ORAL | 1 refills | Status: DC
Start: 2022-10-26 — End: 2023-04-28

## 2022-10-26 MED ORDER — SERTRALINE HCL 100 MG PO TABS: 100.0000 mg | ORAL_TABLET | Freq: Every day | ORAL | 1 refills | Status: DC

## 2022-10-26 MED ORDER — AMLODIPINE BESYLATE 2.5 MG PO TABS
2.5000 mg | ORAL_TABLET | Freq: Two times a day (BID) | ORAL | 1 refills | Status: DC
Start: 2022-10-26 — End: 2023-04-28

## 2022-10-26 NOTE — Patient Instructions (Signed)
No follow-ups on file.        Great to see you today.  I have refilled the medication(s) we provide.   If labs were collected, we will inform you of lab results once received either by echart message or telephone call.   - echart message- for normal results that have been seen by the patient already.   - telephone call: abnormal results or if patient has not viewed results in their echart.  

## 2022-10-26 NOTE — Progress Notes (Signed)
Patient ID: Debra Sanchez, female  DOB: 07/03/1940, 82 y.o.   MRN: [id number] Patient Care Team    Relationship Specialty Notifications Start End  Natalia Leatherwood, DO PCP - General Family Medicine  04/20/15   Marjo Bicker, MD PCP - Cardiology Cardiology  05/16/22   Marcos Eke, PA-C  Neurology  11/27/20   Coralyn Helling, MD Consulting Physician Pulmonary Disease  04/26/21   [name], MD Consulting Physician Orthopedic Surgery  08/30/22     Chief Complaint  Patient presents with   Hypertension    Cognitive decline. 6 month follow up.    Subjective: Debra Sanchez is a 82 y.o.  Female  present for chronic medical condition follow-up combination appointment All past medical history, surgical history, allergies, family history, immunizations, medications and social history were updated in the electronic medical record today. All recent labs, ED visits and hospitalizations within the last year were reviewed.  Essential hypertension Pt reports compliance/restarted Cozaar 100 mg and amlodipine 2.5 g twice a day (her choice)   Patient denies chest pain, shortness of breath, dizziness or lower extremity edema.   Pt does not take a daily baby ASA. She is tolerating start of a statin.  Diet: Watches her diet closely Exercise: Exercises routinely RF: Hypertension, obesity, family history heart disease    Anxiety/Insomnia, unspecified type: Patient reports compliance with Zoloft 100 mg daily. She reports she is feeling well now that she went back on 100 mg dose.   Hypothyroidism: Pt reports compliance with levo 50 mcg qd on an empty stomach. Pt has been referred to neuro for new meningioma and sleep study, d/t memory changes. She is still having memory changes.  Prior note. She has noticed over the last month she has had memory changes and she is concerned she is getting dementia. She takes a vit d and daily vitamin. She does wake herself on few times a night gasping  for breath. She does snore. Body mass index is 29.44 kg/m. She also had covid 6 months ago.      10/26/2022    1:16 PM 08/25/2022   10:59 AM 04/27/2022    1:33 PM 12/23/2021   11:02 AM 10/19/2021   10:33 AM  Depression screen PHQ 2/9  Decreased Interest 0 2 0 0 1  Down, Depressed, Hopeless 0 0 0 0 1  PHQ - 2 Score 0 2 0 0 2  Altered sleeping 0 3   3  Tired, decreased energy 0 2   1  Change in appetite 0 2   2  Feeling bad or failure about yourself  0 0   0  Trouble concentrating 0 0   0  Moving slowly or fidgety/restless 0 0   0  Suicidal thoughts 0 0   0  PHQ-9 Score 0 9   8  Difficult doing work/chores Not difficult at all Very difficult         10/26/2022    1:16 PM 08/25/2022   11:00 AM 10/19/2021   10:33 AM 04/26/2021   10:43 AM  GAD 7 : Generalized Anxiety Score  Nervous, Anxious, on Edge 0 1 2 0  Control/stop worrying 0 0 1 0  Worry too much - different things 0 0 1 0  Trouble relaxing 0 1 1 0  Restless 0 0 0 0  Easily annoyed or irritable 0 2 1 0  Afraid - awful might happen 0 0 0 0  Total  GAD 7 Score 0 4 6 0  Anxiety Difficulty Not difficult at all Somewhat difficult  Not difficult at all    Immunization History  Administered Date(s) Administered   Fluad Quad(high Dose 65+) 03/18/2020, 01/21/2022   Influenza Split 03/08/2020   Influenza, High Dose Seasonal PF 01/16/2017, 01/29/2018, 02/08/2019   Influenza,inj,Quad PF,6+ Mos 01/29/2018   Influenza-Unspecified 02/10/2015, 03/18/2020, 01/09/2021   Pneumococcal Conjugate-13 12/23/2013, 03/18/2019   Pneumococcal Polysaccharide-23 01/29/2018   Td (Adult),5 Lf Tetanus Toxid, Preservative Free 11/26/2016   Tdap 04/12/2006, 11/26/2016   Zoster Recombinant(Shingrix) 02/08/2019     Past Medical History:  Diagnosis Date   Anemia    HX from bleeding ulcer. 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 Tumor removed and paralysis resolved   Pneumonia yrs ago   Syncope 2012   Wears glasses    Allergies  Allergen Reactions   Hydromorphone Itching, Nausea Only and Other (See Comments)    After IV dilaudid pt flushed and felt dizzy   Ciprofloxacin Other (See Comments)    Stomach problems   Codeine Itching   Latex Other (See Comments)    fainted   Morphine And Codeine Itching    migraine   Nsaids Nausea And Vomiting   Penicillins Itching and Nausea And Vomiting    Has patient had a PCN reaction causing immediate rash, facial/tongue/throat swelling, SOB or lightheadedness with hypotension: No Has patient had a PCN reaction causing severe rash involving mucus membranes or skin necrosis: No Has patient had a PCN reaction that required hospitalization No Has patient had a PCN reaction occurring within the last 10 years: Yes If all of the above answers are "NO", then may proceed with Cephalosporin use.    Sulfa Antibiotics Nausea And Vomiting   Past Surgical History:  Procedure Laterality Date   ABDOMINAL HYSTERECTOMY     ANTERIOR CERVICAL DECOMP/DISCECTOMY FUSION     C5-6 and C6-7 anterior cervical diskectomy and fusion    BILATERAL OOPHORECTOMY     CATARACT EXTRACTION W/PHACO Left 10/20/2014   Procedure: CATARACT EXTRACTION PHACO AND INTRAOCULAR LENS PLACEMENT LEFT EYE;  Surgeon: Gemma Payor, MD;  Location: [facility];  Service: Ophthalmology;  Laterality: Left;  CDE:6.98   CATARACT EXTRACTION W/PHACO Right 11/17/2014   Procedure: CATARACT EXTRACTION  PHACO AND INTRAOCULAR LENS PLACEMENT RIGHT EYE CDE=6.57;  Surgeon: Gemma Payor, MD;  Location: [facility];  Service: Ophthalmology;  Laterality: Right;   COLONOSCOPY  01/19/2011   Procedure: COLONOSCOPY;  Surgeon: Malissa Hippo, MD;  [facility];  Service: Endoscopy;  Laterality: N/A;  2:00   COLONOSCOPY WITH PROPOFOL N/A 09/15/2021   Procedure: COLONOSCOPY WITH PROPOFOL;  Surgeon: Malissa Hippo, MD;  [facility];  Service: Endoscopy;  Laterality: N/A;  1230 ASA 2   DILATION  AND CURETTAGE OF UTERUS     ESOPHAGOGASTRODUODENOSCOPY  11/05/2010   Procedure: ESOPHAGOGASTRODUODENOSCOPY (EGD);  Surgeon: Malissa Hippo, MD;  [facility];  Service: Endoscopy;  Laterality: N/A;  7:00 am per Melanie   ESOPHAGOGASTRODUODENOSCOPY N/A 07/25/2012   Procedure: ESOPHAGOGASTRODUODENOSCOPY (EGD);  Surgeon: Malissa Hippo, MD;  [facility];  Service: Endoscopy;  Laterality: N/A;  400-moved to 420 Ann notified pt   ESOPHAGOGASTRODUODENOSCOPY N/A 05/27/2015   Procedure: ESOPHAGOGASTRODUODENOSCOPY (EGD);  Surgeon: Malissa Hippo, MD;  [facility];  Service: Endoscopy;  Laterality: N/A;  2:00   HIATAL HERNIA REPAIR N/A 08/25/2015   Procedure: LAPAROSCOPIC REPAIR OF LARGE TYPE III  HIATAL HERNIA;  Surgeon: Luretha Murphy, MD;  [facility]: [facility];  Service: General;  Laterality: N/A;   LAPAROSCOPIC NISSEN FUNDOPLICATION N/A 06/17/2016   Procedure: ENDOSCOPY, HIATAL HERNIA REPAIR, AND TAKE DOWN OF NISSEN FUNDOPILICATION;  Surgeon: Luretha Murphy, MD;  Location: [facility];  Service: General;  Laterality: N/A;   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   2 children Christeen Douglas and Drinda Butts)    Retired, 2 yrs college.    Caffeine beverages.   Takes a daily vitamin.   Wears her seatbelt. Smoke detector in the home.    Feels safe in her relationships.       Right handed              Allergies as of 10/26/2022       Reactions   Hydromorphone Itching, Nausea Only, Other (See Comments)   After IV dilaudid pt flushed and felt dizzy   Ciprofloxacin Other (See Comments)   Stomach problems   Codeine Itching   Latex Other (See Comments)   fainted   Morphine And Codeine Itching   migraine   Nsaids Nausea And Vomiting   Penicillins Itching, Nausea And Vomiting   Has patient had a PCN reaction causing immediate rash, facial/tongue/throat swelling, SOB or lightheadedness with hypotension: No Has patient had a PCN reaction causing severe rash involving mucus membranes or skin necrosis: No Has patient had a PCN reaction that required hospitalization No Has patient had a PCN reaction occurring within the last 10 years: Yes If all of the above answers are "NO", then may proceed with Cephalosporin use.   Sulfa Antibiotics Nausea And Vomiting        Medication List        Accurate as of October 26, 2022  1:35 PM. If you have any questions, ask your nurse or doctor.          STOP taking these medications    traZODone 50 MG tablet Commonly known as: DESYREL Stopped by:  Jonathan Corpus       TAKE these medications    albuterol 108 (90 Base) MCG/ACT inhaler Commonly known as: VENTOLIN HFA Inhale 1-2 puffs into the lungs every 6 (six) hours as needed for wheezing or shortness of breath.   amLODipine 2.5 MG  tablet Commonly known as: NORVASC Take 1 tablet (2.5 mg total) by mouth 2 (two) times daily. Started by: Felix Pacini   b complex vitamins capsule Take 1 capsule by mouth daily.   CLEAR EYES FOR DRY EYES OP Place 1 drop into both eyes daily as needed (dry eyes).   donepezil 10 MG disintegrating tablet Commonly known as: ARICEPT ODT Take 1 tablet (10 mg total) by mouth at bedtime. Started by: Felix Pacini   esomeprazole 40 MG capsule Commonly known as: NEXIUM Take 1 capsule (40 mg total) by mouth daily before breakfast.   famotidine 40 MG tablet Commonly known as: PEPCID Take 1 tablet (40 mg total) by mouth at bedtime.   levothyroxine 50 MCG tablet Commonly known as: SYNTHROID Take 1 tablet (50 mcg total) by mouth daily.   losartan 100 MG tablet Commonly known as: COZAAR Take 1 tablet (100 mg total) by mouth daily. Started by: Felix Pacini   Melatonin 10 MG Tabs Take 20 mg by mouth at bedtime.   NON FORMULARY Apply 1 Application topically 2 (two) times daily. Hemp Cream   sertraline 100 MG tablet Commonly known as: ZOLOFT Take 1 tablet (100 mg total) by mouth at bedtime.   traMADol 50 MG tablet Commonly known as: ULTRAM Take 1 tablet (50 mg total) by mouth every 6 (six) hours as needed.        All past medical history, surgical history, allergies, family history, immunizations andmedications were updated in the EMR today and reviewed under the history and medication portions of their EMR.     No results found.  ROS 14 pt review of systems performed and negative (unless mentioned in an HPI)  Objective: BP 128/82   Pulse 76   Temp (!) 97.5 F (36.4 C) (Oral)   Wt 191 lb 6.4 oz (86.8 kg)   SpO2 97%   BMI 29.10 kg/m  Physical Exam Vitals and nursing note reviewed.  Constitutional:      General: She is not in acute distress.    Appearance: Normal appearance. She is not ill-appearing, toxic-appearing or diaphoretic.  HENT:     Head: Normocephalic and  atraumatic.  Eyes:     General: No scleral icterus.       Right eye: No discharge.        Left eye: No discharge.     Extraocular Movements: Extraocular movements intact.     Conjunctiva/sclera: Conjunctivae normal.     Pupils: Pupils are equal, round, and reactive to light.  Cardiovascular:     Rate and Rhythm: Normal rate and regular rhythm.  Pulmonary:     Effort: Pulmonary effort is normal. No respiratory distress.     Breath sounds: Normal breath sounds. No wheezing, rhonchi or rales.  Musculoskeletal:     Right lower leg: No edema.     Left lower leg: No edema.  Skin:    General: Skin is warm.     Findings: No rash.  Neurological:     Mental Status: She is alert and oriented to person, place, and time. Mental status is at baseline.     Motor: No weakness.     Gait: Gait normal.  Psychiatric:  Mood and Affect: Mood normal.        Behavior: Behavior normal.        Thought Content: Thought content normal.        Judgment: Judgment normal.    No results found.  Assessment/plan: Debra Sanchez is a 82 y.o. female present for cmc Hypothyroidism, unspecified type  stable Continue  levothyroxine 50 mcg daily.   Memory changes/Meningioma (HCC), 8 mm right temporal She has symptoms of sleep apnea which certainly may also be contributing.  - now est with neuro and pulm.  - encouraged her to have the sleep study completed with Dr. Craige Cotta.  -continue  Aricept to 10 mg nightly (pt had reported ashe stopped using on 5/16 appt), but since restarted and wants to continue   Pulmonary nodules Low risk of ca, pul nodules have been stable. No further image needed.   Meningioma (HCC), 8 mm right temporal Following with neuro  Anxiety/insomnia Stable Continue Zoloft 100 mg tablet.   Tried: trazodone caused headache  Primary hypertension/hyperlipidemia stable Patient has declined to restart her statin Continue losartan 100 mg  Continue amlodipine  2.5 mg twice  daily  Return in about 6 months (around 04/17/2023) for Routine chronic condition follow-up.   No orders of the defined types were placed in this encounter.  Meds ordered this encounter  Medications   donepezil (ARICEPT ODT) 10 MG disintegrating tablet    Sig: Take 1 tablet (10 mg total) by mouth at bedtime.    Dispense:  90 tablet    Refill:  1    Please use this prescription, and discontinue lower dose.  Dose change.   levothyroxine (SYNTHROID) 50 MCG tablet    Sig: Take 1 tablet (50 mcg total) by mouth daily.    Dispense:  90 tablet    Refill:  3   famotidine (PEPCID) 40 MG tablet    Sig: Take 1 tablet (40 mg total) by mouth at bedtime.    Dispense:  90 tablet    Refill:  3   esomeprazole (NEXIUM) 40 MG capsule    Sig: Take 1 capsule (40 mg total) by mouth daily before breakfast.    Dispense:  90 capsule    Refill:  3    Requesting 1 year supply   sertraline (ZOLOFT) 100 MG tablet    Sig: Take 1 tablet (100 mg total) by mouth at bedtime.    Dispense:  90 tablet    Refill:  1   amLODipine (NORVASC) 2.5 MG tablet    Sig: Take 1 tablet (2.5 mg total) by mouth 2 (two) times daily.    Dispense:  180 tablet    Refill:  1    Please send a replace/new response with 100-Day Supply if appropriate to maximize member benefit. Requesting 1 year supply.   losartan (COZAAR) 100 MG tablet    Sig: Take 1 tablet (100 mg total) by mouth daily.    Dispense:  90 tablet    Refill:  1   Referral Orders  No referral(s) requested today      Electronically signed by: Felix Pacini, DO [facility]

## 2022-11-24 ENCOUNTER — Encounter (INDEPENDENT_AMBULATORY_CARE_PROVIDER_SITE_OTHER): Payer: Self-pay

## 2022-11-29 ENCOUNTER — Telehealth: Payer: Self-pay | Admitting: Family Medicine

## 2022-11-29 MED ORDER — DONEPEZIL HCL 10 MG PO TBDP: 10.0000 mg | ORAL_TABLET | Freq: Every day | ORAL | 1 refills | Status: DC

## 2022-11-29 NOTE — Telephone Encounter (Signed)
Patient returned Chloe's call regarding refill.  Patient made aware, med refill was approved and sent to local pharmacy.

## 2022-11-29 NOTE — Telephone Encounter (Signed)
LVM & sent MyChart

## 2022-11-29 NOTE — Telephone Encounter (Signed)
Per patient request sent her Aricept prescription  to local pharmacy patient is requesting.

## 2022-11-29 NOTE — Telephone Encounter (Signed)
Patient states she never received her medication from [facility] home delivery for donepezil (ARICEPT ODT) 10 MG disintegrating tablet  She is completely out and asking that it be sent to her local pharmacy which is [facility] in [place].

## 2022-11-29 NOTE — Telephone Encounter (Signed)
See other encounter.

## 2022-12-18 ENCOUNTER — Other Ambulatory Visit: Payer: Self-pay | Admitting: Family Medicine

## 2022-12-18 DIAGNOSIS — E782 Mixed hyperlipidemia: Secondary | ICD-10-CM

## 2023-01-03 ENCOUNTER — Other Ambulatory Visit: Payer: Self-pay | Admitting: Family Medicine

## 2023-01-03 DIAGNOSIS — I10 Essential (primary) hypertension: Secondary | ICD-10-CM

## 2023-01-04 ENCOUNTER — Ambulatory Visit: Payer: Medicare Other

## 2023-01-12 DIAGNOSIS — M25532 Pain in left wrist: Secondary | ICD-10-CM | POA: Diagnosis not present

## 2023-02-01 DIAGNOSIS — J0100 Acute maxillary sinusitis, unspecified: Secondary | ICD-10-CM | POA: Diagnosis not present

## 2023-02-13 ENCOUNTER — Telehealth: Payer: Self-pay | Admitting: Family Medicine

## 2023-02-13 NOTE — Telephone Encounter (Signed)
Okay with manufacturer changed

## 2023-02-13 NOTE — Telephone Encounter (Signed)
Spoke with pharmacy regarding results/recommendations.

## 2023-02-13 NOTE — Telephone Encounter (Signed)
Debra Sanchez with [facility] called to inquire if the prescriber Dr. Claiborne Billings is ok with the manufacture change for levothyroxine (SYNTHROID) 50 MCG tablet . Please give Debra Sanchez a call back at [phone]. Reference [phone]

## 2023-03-02 ENCOUNTER — Ambulatory Visit: Payer: Medicare Other | Admitting: Orthopaedic Surgery

## 2023-04-20 ENCOUNTER — Ambulatory Visit: Payer: Medicare Other | Admitting: Orthopaedic Surgery

## 2023-04-26 ENCOUNTER — Ambulatory Visit: Payer: Medicare Other | Admitting: Orthopaedic Surgery

## 2023-04-26 DIAGNOSIS — M1712 Unilateral primary osteoarthritis, left knee: Secondary | ICD-10-CM | POA: Insufficient documentation

## 2023-04-26 DIAGNOSIS — Z96651 Presence of right artificial knee joint: Secondary | ICD-10-CM | POA: Diagnosis not present

## 2023-04-26 DIAGNOSIS — M6281 Muscle weakness (generalized): Secondary | ICD-10-CM | POA: Diagnosis not present

## 2023-04-26 NOTE — Progress Notes (Signed)
 Office Visit Note   Patient: Debra Sanchez           Date of Birth: [birth year]           MRN: [id number] Visit Date: 04/26/2023              Requested by: Mariel Shope, DO [address] [place],  [place] [address] PCP: Mariel Shope, DO   Assessment & Plan: Visit Diagnoses:  1. S/P TKR (total knee replacement), right   2. Quadriceps weakness   3. Status post total right knee replacement     Plan: Patient needs quad strengthening on the right so she can go up steps without having to pull herself up with a handrail.  Left quad is strong with the right total knee post surgery remains weak.  She had a fall after surgery and she had home therapy but delayed outpatient therapy.  In addition with her fall she had a wrist fracture which was surgically treated at [facility] in [place].  Patient needs right quad strengthening and wants her right quad is strong then she could consider left total knee arthroplasty.  Discussed with her I will be retiring and will make some arrangements for my partners to do her left total knee when she gets her right leg back to normal strength and asymptomatic.  Follow-Up Instructions: Return in about 1 month (around 05/27/2023).   Orders:  Orders Placed This Encounter  Procedures   Ambulatory referral to Physical Therapy   No orders of the defined types were placed in this encounter.     Procedures: No procedures performed   Clinical Data: No additional findings.   Subjective: Chief Complaint  Patient presents with   Left Knee - Pain    HPI 83 year old female post right TKA by me 07/11/2022.  Still has some symptoms in the right leg sometimes aching at the end of the day still has to use a rail to go up and down steps.  She is here to discuss left total knee arthroplasty.  Review of Systems all other systems unchanged.   Objective: Vital Signs: There were no vitals taken for this visit.  Physical Exam Constitutional:       Appearance: She is well-developed.  HENT:     Head: Normocephalic.     Right Ear: External ear normal.     Left Ear: External ear normal. There is no impacted cerumen.  Eyes:     Pupils: Pupils are equal, round, and reactive to light.  Neck:     Thyroid : No thyromegaly.     Trachea: No tracheal deviation.  Cardiovascular:     Rate and Rhythm: Normal rate.  Pulmonary:     Effort: Pulmonary effort is normal.  Abdominal:     Palpations: Abdomen is soft.  Musculoskeletal:     Cervical back: No rigidity.  Skin:    General: Skin is warm and dry.  Neurological:     Mental Status: She is alert and oriented to person, place, and time.  Psychiatric:        Behavior: Behavior normal.     Ortho Exam right knee flexion to more than 115 degrees.  She reaches full extension can do straight leg raising but I can overcome it with 1 finger resisted pressure.  Opposite left knee has some crepitus knee range of motion palpable osteophytes and crepitus with knee flexion extension.  Negative logroll hips pulses are normal calf is soft.  Specialty Comments:  No specialty comments available.  Imaging: No results found.   PMFS History: Patient Active Problem List   Diagnosis Date Noted   Hyponatremia 08/25/2022   Closed fracture of left distal radius 08/25/2022   Fall 08/25/2022   Lightheadedness 08/25/2022   Status post total right knee replacement 07/27/2022   Palpitations 05/16/2022   Carotid bruit 05/16/2022   Increase in creatinine 04/28/2022   Memory changes 10/19/2021   Meningioma (HCC), 8 mm right temporal 03/18/2021   Esophageal dysphagia 10/13/2020   Continuous leakage of urine 03/18/2020   Mixed hyperlipidemia 03/19/2019   GERD (gastroesophageal reflux disease) 12/18/2018   Anxiety 10/22/2018   Persistent headaches 11/18/2016   Hiatal hernia with GERD 06/17/2016   Status post laparoscopic Nissen fundoplication 08/25/2015   Pulmonary nodules 05/12/2015   Hypertension  05/15/2012   Hypothyroidism 05/15/2012   Anemia, blood loss 05/15/2012   Past Medical History:  Diagnosis Date   Anemia    HX from bleeding ulcer. 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   Pneumonia yrs ago   Syncope 2012   Wears glasses     Family History  Problem Relation Age of Onset   Colon cancer Mother    Heart disease Mother    Arthritis Mother    Heart disease Father    Arthritis Father    Arthritis Sister    Arthritis Brother    Lung cancer Brother        smoker   Arthritis Maternal Aunt    Arthritis Maternal Uncle    Diabetes Maternal Uncle     Past Surgical History:  Procedure Laterality Date   ABDOMINAL HYSTERECTOMY     ANTERIOR CERVICAL DECOMP/DISCECTOMY FUSION     C5-6 and C6-7 anterior cervical diskectomy and fusion    BILATERAL OOPHORECTOMY     CATARACT EXTRACTION W/PHACO Left 10/20/2014   Procedure: CATARACT EXTRACTION PHACO AND INTRAOCULAR LENS PLACEMENT LEFT EYE;  Surgeon: Anner Kill, MD;  [facility]: [facility];  Service: Ophthalmology;  Laterality: Left;  CDE:6.98   CATARACT EXTRACTION W/PHACO Right 11/17/2014   Procedure: CATARACT EXTRACTION PHACO AND INTRAOCULAR LENS PLACEMENT RIGHT EYE CDE=6.57;  Surgeon: Anner Kill, MD;  Location: [facility];  Service: Ophthalmology;  Laterality: Right;   COLONOSCOPY  01/19/2011   Procedure: COLONOSCOPY;  Surgeon: Ruby Corporal, MD;   [facility];  Service: Endoscopy;  Laterality: N/A;  2:00   COLONOSCOPY WITH PROPOFOL  N/A 09/15/2021   Procedure: COLONOSCOPY WITH PROPOFOL ;  Surgeon: Ruby Corporal, MD;  [facility];  Service: Endoscopy;  Laterality: N/A;  1230 ASA 2   DILATION AND CURETTAGE OF UTERUS     ESOPHAGOGASTRODUODENOSCOPY  11/05/2010   Procedure: ESOPHAGOGASTRODUODENOSCOPY (EGD);  Surgeon: Ruby Corporal, MD;  [facility];  Service: Endoscopy;  Laterality: N/A;  7:00 am per Melanie   ESOPHAGOGASTRODUODENOSCOPY N/A 07/25/2012   Procedure: ESOPHAGOGASTRODUODENOSCOPY (EGD);  Surgeon: Ruby Corporal, MD;  [facility];  Service: Endoscopy;  Laterality: N/A;  400-moved to 420 Ann notified pt   ESOPHAGOGASTRODUODENOSCOPY N/A 05/27/2015   Procedure: ESOPHAGOGASTRODUODENOSCOPY (EGD);  Surgeon: Ruby Corporal, MD;  [facility];  Service: Endoscopy;  Laterality: N/A;  2:00   HIATAL HERNIA REPAIR N/A 08/25/2015   Procedure: LAPAROSCOPIC REPAIR OF LARGE TYPE III  HIATAL HERNIA;  Surgeon: Jacolyn Matar, MD;  Location: [facility];  Service: General;  Laterality: N/A;   LAPAROSCOPIC NISSEN FUNDOPLICATION N/A 06/17/2016   Procedure: ENDOSCOPY, HIATAL HERNIA REPAIR, AND TAKE DOWN OF NISSEN FUNDOPILICATION;  Surgeon: Jacolyn Matar, MD;  Location: [facility];  Service: General;  Laterality: N/A;   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

## 2023-04-28 ENCOUNTER — Encounter: Payer: Self-pay | Admitting: Family Medicine

## 2023-04-28 ENCOUNTER — Ambulatory Visit (INDEPENDENT_AMBULATORY_CARE_PROVIDER_SITE_OTHER): Payer: Medicare Other | Admitting: Family Medicine

## 2023-04-28 VITALS — BP 134/80 | HR 78 | Temp 97.7°F | Wt 193.8 lb

## 2023-04-28 DIAGNOSIS — R413 Other amnesia: Secondary | ICD-10-CM | POA: Diagnosis not present

## 2023-04-28 DIAGNOSIS — K219 Gastro-esophageal reflux disease without esophagitis: Secondary | ICD-10-CM

## 2023-04-28 DIAGNOSIS — D500 Iron deficiency anemia secondary to blood loss (chronic): Secondary | ICD-10-CM | POA: Diagnosis not present

## 2023-04-28 DIAGNOSIS — E559 Vitamin D deficiency, unspecified: Secondary | ICD-10-CM | POA: Insufficient documentation

## 2023-04-28 DIAGNOSIS — E611 Iron deficiency: Secondary | ICD-10-CM | POA: Diagnosis not present

## 2023-04-28 DIAGNOSIS — Z5181 Encounter for therapeutic drug level monitoring: Secondary | ICD-10-CM | POA: Diagnosis not present

## 2023-04-28 DIAGNOSIS — Z23 Encounter for immunization: Secondary | ICD-10-CM

## 2023-04-28 DIAGNOSIS — E063 Autoimmune thyroiditis: Secondary | ICD-10-CM | POA: Diagnosis not present

## 2023-04-28 DIAGNOSIS — I10 Essential (primary) hypertension: Secondary | ICD-10-CM | POA: Diagnosis not present

## 2023-04-28 DIAGNOSIS — F419 Anxiety disorder, unspecified: Secondary | ICD-10-CM

## 2023-04-28 DIAGNOSIS — D329 Benign neoplasm of meninges, unspecified: Secondary | ICD-10-CM | POA: Diagnosis not present

## 2023-04-28 DIAGNOSIS — E871 Hypo-osmolality and hyponatremia: Secondary | ICD-10-CM | POA: Diagnosis not present

## 2023-04-28 DIAGNOSIS — E782 Mixed hyperlipidemia: Secondary | ICD-10-CM

## 2023-04-28 DIAGNOSIS — K449 Diaphragmatic hernia without obstruction or gangrene: Secondary | ICD-10-CM | POA: Diagnosis not present

## 2023-04-28 MED ORDER — LOSARTAN POTASSIUM 100 MG PO TABS: 100.0000 mg | ORAL_TABLET | Freq: Every day | ORAL | 1 refills | Status: AC

## 2023-04-28 MED ORDER — DONEPEZIL HCL 10 MG PO TBDP: 10.0000 mg | ORAL_TABLET | Freq: Every day | ORAL | 1 refills | Status: AC

## 2023-04-28 MED ORDER — AMLODIPINE BESYLATE 2.5 MG PO TABS: 2.5000 mg | ORAL_TABLET | Freq: Two times a day (BID) | ORAL | 1 refills | Status: AC

## 2023-04-28 MED ORDER — SERTRALINE HCL 100 MG PO TABS: 100.0000 mg | ORAL_TABLET | Freq: Every day | ORAL | 1 refills | Status: DC

## 2023-04-28 NOTE — Progress Notes (Signed)
Patient ID: Debra Sanchez, female  DOB: [birth year], 83 y.o.   MRN: [id number] Patient Care Team    Relationship Specialty Notifications Start End  Natalia Leatherwood, DO PCP - General Family Medicine  04/20/15   Marjo Bicker, MD PCP - Cardiology Cardiology  05/16/22   Marcos Eke, PA-C  Neurology  11/27/20   Coralyn Helling, MD (Inactive) Consulting Physician Pulmonary Disease  04/26/21   [name], MD Consulting Physician Orthopedic Surgery  08/30/22     Chief Complaint  Patient presents with   Medical Management of Chronic Issues    [facility]   Hypertension    Subjective: Debra Sanchez is a 83 y.o.  Female  present for chronic medical condition follow-up combination appointment All past medical history, surgical history, allergies, family history, immunizations, medications and social history were updated in the electronic medical record today. All recent labs, ED visits and hospitalizations within the last year were reviewed.  Essential hypertension Pt reports compliance with Cozaar 100 mg and amlodipine 2.5 g twice a day (her choice)   Patient denies chest pain, shortness of breath, dizziness or lower extremity edema.  Pt does not take a daily baby ASA. She is tolerating start of a statin.  Diet: Watches her diet closely Exercise: Exercises routinely RF: Hypertension, obesity, family history heart disease    Anxiety/Insomnia, unspecified type: Patient reports compliance with Zoloft 100 mg daily.  Patient reports medication is working well for her   Hypothyroidism: Pt reports compliance with levo 50 mcg qd on an empty stomach.   Mild cognitive decline: Pt has been referred to neuro for new meningioma and sleep study, d/t memory changes. She is still having memory changes.  Prior note. She has noticed over the last month she has had memory changes and she is concerned she is getting dementia. She takes a vit d and daily vitamin. She does wake herself on few  times a night gasping for breath. She does snore. Body mass index is 29.44 kg/m. She also had covid 6 months ago.      04/28/2023    1:10 PM 10/26/2022    1:16 PM 08/25/2022   10:59 AM 04/27/2022    1:33 PM 12/23/2021   11:02 AM  Depression screen PHQ 2/9  Decreased Interest 0 0 2 0 0  Down, Depressed, Hopeless 0 0 0 0 0  PHQ - 2 Score 0 0 2 0 0  Altered sleeping  0 3    Tired, decreased energy  0 2    Change in appetite  0 2    Feeling bad or failure about yourself   0 0    Trouble concentrating  0 0    Moving slowly or fidgety/restless  0 0    Suicidal thoughts  0 0    PHQ-9 Score  0 9    Difficult doing work/chores  Not difficult at all Very difficult        10/26/2022    1:16 PM 08/25/2022   11:00 AM 10/19/2021   10:33 AM 04/26/2021   10:43 AM  GAD 7 : Generalized Anxiety Score  Nervous, Anxious, on Edge 0 1 2 0  Control/stop worrying 0 0 1 0  Worry too much - different things 0 0 1 0  Trouble relaxing 0 1 1 0  Restless 0 0 0 0  Easily annoyed or irritable 0 2 1 0  Afraid - awful might happen 0 0 0  0  Total GAD 7 Score 0 4 6 0  Anxiety Difficulty Not difficult at all Somewhat difficult  Not difficult at all    Immunization History  Administered Date(s) Administered   Fluad Quad(high Dose 65+) 03/18/2020, 01/21/2022   Influenza Split 03/08/2020   Influenza, High Dose Seasonal PF 01/16/2017, 01/29/2018, 02/08/2019   Influenza,inj,Quad PF,6+ Mos 01/29/2018   Influenza-Unspecified 02/10/2015, 03/18/2020, 01/09/2021   Pneumococcal Conjugate-13 12/23/2013, 03/18/2019   Pneumococcal Polysaccharide-23 01/29/2018   Td (Adult),5 Lf Tetanus Toxid, Preservative Free 11/26/2016   Tdap 04/12/2006, 11/26/2016   Zoster Recombinant(Shingrix) 02/08/2019     Past Medical History:  Diagnosis Date   Anemia    HX from bleeding ulcer. 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 Tumor removed and paralysis resolved   Pneumonia yrs ago   Syncope 2012   Wears glasses    Allergies  Allergen Reactions   Hydromorphone Itching, Nausea Only and Other (See Comments)    After IV dilaudid pt flushed and felt dizzy   Ciprofloxacin Other (See Comments)    Stomach problems   Codeine Itching   Latex Other (See Comments)    fainted   Morphine And Codeine Itching    migraine   Nsaids Nausea And Vomiting   Penicillins Itching and Nausea And Vomiting    Has patient had a PCN reaction causing immediate rash, facial/tongue/throat swelling, SOB or lightheadedness with hypotension: No Has patient had a PCN reaction causing severe rash involving mucus membranes or skin necrosis: No Has patient had a PCN reaction that required hospitalization No Has patient had a PCN reaction occurring within the last 10 years: Yes If all of the above answers are "NO", then may proceed with Cephalosporin use.    Sulfa Antibiotics Nausea And Vomiting   Past Surgical History:  Procedure Laterality Date   ABDOMINAL HYSTERECTOMY     ANTERIOR CERVICAL DECOMP/DISCECTOMY FUSION     C5-6 and C6-7 anterior cervical diskectomy and fusion    BILATERAL OOPHORECTOMY     CATARACT EXTRACTION W/PHACO Left 10/20/2014   Procedure: CATARACT EXTRACTION PHACO AND INTRAOCULAR LENS PLACEMENT LEFT EYE;  Surgeon: Gemma Payor, MD;  [facility]: [facility];  Service: Ophthalmology;  Laterality: Left;  CDE:6.98   CATARACT EXTRACTION W/PHACO Right 11/17/2014   Procedure: CATARACT EXTRACTION  PHACO AND INTRAOCULAR LENS PLACEMENT RIGHT EYE CDE=6.57;  Surgeon: Gemma Payor, MD;  [facility]: [facility];  Service: Ophthalmology;  Laterality: Right;   COLONOSCOPY  01/19/2011   Procedure: COLONOSCOPY;  Surgeon: Malissa Hippo, MD;  [facility];  Service: Endoscopy;  Laterality: N/A;  2:00   COLONOSCOPY WITH PROPOFOL N/A 09/15/2021   Procedure: COLONOSCOPY WITH PROPOFOL;  Surgeon: Malissa Hippo, MD;  [facility];  Service: Endoscopy;  Laterality: N/A;  1230 ASA 2  DILATION AND CURETTAGE OF UTERUS     ESOPHAGOGASTRODUODENOSCOPY  11/05/2010   Procedure: ESOPHAGOGASTRODUODENOSCOPY (EGD);  Surgeon: Malissa Hippo, MD;  [facility];  Service: Endoscopy;  Laterality: N/A;  7:00 am per Melanie   ESOPHAGOGASTRODUODENOSCOPY N/A 07/25/2012   Procedure: ESOPHAGOGASTRODUODENOSCOPY (EGD);  Surgeon: Malissa Hippo, MD;  [facility];  Service: Endoscopy;  Laterality: N/A;  400-moved to 420 Ann notified pt   ESOPHAGOGASTRODUODENOSCOPY N/A 05/27/2015   Procedure: ESOPHAGOGASTRODUODENOSCOPY (EGD);  Surgeon: Malissa Hippo, MD;  [facility];  Service: Endoscopy;  Laterality: N/A;  2:00   HIATAL HERNIA REPAIR N/A 08/25/2015   Procedure: LAPAROSCOPIC REPAIR OF LARGE TYPE III  HIATAL HERNIA;  Surgeon: Luretha Murphy, MD;  [facility];  Service: General;  Laterality: N/A;   LAPAROSCOPIC NISSEN FUNDOPLICATION N/A 06/17/2016   Procedure: ENDOSCOPY, HIATAL HERNIA REPAIR, AND TAKE DOWN OF NISSEN FUNDOPILICATION;  Surgeon: Luretha Murphy, MD;  [facility]: [facility];  Service: General;  Laterality: N/A;   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   2 children Christeen Douglas and Drinda Butts)    Retired, 2 yrs college.    Caffeine beverages.   Takes a daily vitamin.   Wears her seatbelt. Smoke detector in the home.    Feels safe in her relationships.       Right handed              Allergies as of 04/28/2023       Reactions   Hydromorphone Itching, Nausea Only, Other (See Comments)   After IV dilaudid pt flushed and felt dizzy   Ciprofloxacin Other (See Comments)   Stomach problems   Codeine Itching   Latex Other (See Comments)   fainted   Morphine And Codeine Itching   migraine   Nsaids Nausea And Vomiting   Penicillins Itching, Nausea And Vomiting   Has patient had a PCN reaction causing immediate rash, facial/tongue/throat swelling, SOB or lightheadedness with hypotension: No Has patient had a PCN reaction causing severe rash involving mucus membranes or skin necrosis: No Has patient had a PCN reaction that required hospitalization No Has patient had a PCN reaction occurring within the last 10 years: Yes If all of the above answers are "NO", then may proceed with Cephalosporin use.   Sulfa Antibiotics Nausea And Vomiting        Medication List        Accurate as of April 28, 2023  1:25 PM. If you have any questions, ask your nurse or doctor.          STOP taking these medications    traMADol 50 MG tablet Commonly known as: ULTRAM Stopped  by: Felix Pacini       TAKE these medications    albuterol 108 (90 Base) MCG/ACT inhaler Commonly known as: VENTOLIN HFA Inhale 1-2 puffs into the lungs every 6 (six) hours as needed for wheezing or shortness of breath.   amLODipine 2.5 MG  tablet Commonly known as: NORVASC Take 1 tablet (2.5 mg total) by mouth 2 (two) times daily.   b complex vitamins capsule Take 1 capsule by mouth daily.   CLEAR EYES FOR DRY EYES OP Place 1 drop into both eyes daily as needed (dry eyes).   donepezil 10 MG disintegrating tablet Commonly known as: ARICEPT ODT Take 1 tablet (10 mg total) by mouth at bedtime.   esomeprazole 40 MG capsule Commonly known as: NEXIUM Take 1 capsule (40 mg total) by mouth daily before breakfast.   famotidine 40 MG tablet Commonly known as: PEPCID Take 1 tablet (40 mg total) by mouth at bedtime.   levothyroxine 50 MCG tablet Commonly known as: SYNTHROID Take 1 tablet (50 mcg total) by mouth daily.   losartan 100 MG tablet Commonly known as: COZAAR Take 1 tablet (100 mg total) by mouth daily.   Melatonin 10 MG Tabs Take 20 mg by mouth at bedtime.   NON FORMULARY Apply 1 Application topically 2 (two) times daily. Hemp Cream   sertraline 100 MG tablet Commonly known as: ZOLOFT Take 1 tablet (100 mg total) by mouth at bedtime.        All past medical history, surgical history, allergies, family history, immunizations andmedications were updated in the EMR today and reviewed under the history and medication portions of their EMR.     No results found.  ROS 14 pt review of systems performed and negative (unless mentioned in an HPI)  Objective: BP 134/80   Pulse 78   Temp 97.7 F (36.5 C)   Wt 193 lb 12.8 oz (87.9 kg)   SpO2 98%   BMI 29.47 kg/m  Physical Exam Vitals and nursing note reviewed.  Constitutional:      General: She is not in acute distress.    Appearance: Normal appearance. She is not ill-appearing, toxic-appearing or diaphoretic.  HENT:     Head: Normocephalic and atraumatic.  Eyes:     General: No scleral icterus.       Right eye: No discharge.        Left eye: No discharge.     Extraocular Movements: Extraocular movements intact.     Conjunctiva/sclera:  Conjunctivae normal.     Pupils: Pupils are equal, round, and reactive to light.  Cardiovascular:     Rate and Rhythm: Normal rate and regular rhythm.  Pulmonary:     Effort: Pulmonary effort is normal. No respiratory distress.     Breath sounds: Normal breath sounds. No wheezing, rhonchi or rales.  Musculoskeletal:     Right lower leg: No edema.     Left lower leg: No edema.  Skin:    General: Skin is warm.     Findings: No rash.  Neurological:     Mental Status: She is alert and oriented to person, place, and time. Mental status is at baseline.     Motor: No weakness.     Gait: Gait normal.  Psychiatric:        Mood and Affect: Mood normal.        Behavior: Behavior normal.        Thought Content: Thought content normal.  Judgment: Judgment normal.    No results found.  Assessment/plan: Debra Sanchez is a 83 y.o. female present for chronic condition management Hypothyroidism, unspecified type  Stable Continue levothyroxine 50 mcg daily. Labs up-to-date 10/2022  Memory changes/Meningioma [facility]), 8 mm right temporal She has symptoms of sleep apnea which certainly may also be contributing to her declining memory.  - est with neuro and pulm.  - encouraged her to have the sleep study completed with Dr. Craige Cotta, if she has not already -Continue Aricept to 10 mg nightly   Pulmonary nodules Low risk of ca, pul nodules have been stable. No further image needed.   Meningioma (HCC), 8 mm right temporal Following with neuro  Anxiety/insomnia Stable Continue Zoloft 100 mg tablet.   Tried: trazodone caused headache  Primary hypertension/hyperlipidemia Stable Patient has declined to restart her statin Continue losartan 100 mg  Continue amlodipine  2.5 mg twice daily  Return in about 24 weeks (around 10/13/2023).   Orders Placed This Encounter  Procedures   CBC   Iron, TIBC and Ferritin Panel   Vitamin D (25 hydroxy)   TSH   T4, free   Direct LDL   B12    Magnesium   Comp Met (CMET)   Meds ordered this encounter  Medications   sertraline (ZOLOFT) 100 MG tablet    Sig: Take 1 tablet (100 mg total) by mouth at bedtime.    Dispense:  90 tablet    Refill:  1    90 days with refill only   losartan (COZAAR) 100 MG tablet    Sig: Take 1 tablet (100 mg total) by mouth daily.    Dispense:  90 tablet    Refill:  1    90 days with refill only   donepezil (ARICEPT ODT) 10 MG disintegrating tablet    Sig: Take 1 tablet (10 mg total) by mouth at bedtime.    Dispense:  90 tablet    Refill:  1   amLODipine (NORVASC) 2.5 MG tablet    Sig: Take 1 tablet (2.5 mg total) by mouth 2 (two) times daily.    Dispense:  180 tablet    Refill:  1    90 days with refill only   Referral Orders  No referral(s) requested today      Electronically signed by: Felix Pacini, DO [facility]

## 2023-04-28 NOTE — Patient Instructions (Addendum)
Return in about 24 weeks (around 10/13/2023).        Great to see you today.  I have refilled the medication(s) we provide.   If labs were collected or images ordered, we will inform you of  results once we have received them and reviewed. We will contact you either by echart message, or telephone call.  Please give ample time to the testing facility, and our office to run,  receive and review results. Please do not call inquiring of results, even if you can see them in your chart. We will contact you as soon as we are able. If it has been over 1 week since the test was completed, and you have not yet heard from [place], then please call us.    - echart message- for normal results that have been seen by the patient already.   - telephone call: abnormal results or if patient has not viewed results in their echart.  If a referral to a specialist was entered for you, please call us in 2 weeks if you have not heard from the specialist office to schedule.

## 2023-04-29 LAB — COMPREHENSIVE METABOLIC PANEL
AG Ratio: 1.8 (calc) (ref 1.0–2.5)
ALT: 11 U/L (ref 6–29)
AST: 15 U/L (ref 10–35)
Albumin: 4.1 g/dL (ref 3.6–5.1)
Alkaline phosphatase (APISO): 88 U/L (ref 37–153)
BUN: 16 mg/dL (ref 7–25)
CO2: 29 mmol/L (ref 20–32)
Calcium: 9.6 mg/dL (ref 8.6–10.4)
Chloride: 105 mmol/L (ref 98–110)
Creat: 0.95 mg/dL (ref 0.60–0.95)
Globulin: 2.3 g/dL (ref 1.9–3.7)
Glucose, Bld: 85 mg/dL (ref 65–99)
Potassium: 4.5 mmol/L (ref 3.5–5.3)
Sodium: 140 mmol/L (ref 135–146)
Total Bilirubin: 0.5 mg/dL (ref 0.2–1.2)
Total Protein: 6.4 g/dL (ref 6.1–8.1)

## 2023-04-29 LAB — VITAMIN B12: Vitamin B-12: 699 pg/mL (ref 200–1100)

## 2023-04-29 LAB — CBC
HCT: 40.2 % (ref 35.0–45.0)
Hemoglobin: 13.5 g/dL (ref 11.7–15.5)
MCH: 31.2 pg (ref 27.0–33.0)
MCHC: 33.6 g/dL (ref 32.0–36.0)
MCV: 92.8 fL (ref 80.0–100.0)
MPV: 11.3 fL (ref 7.5–12.5)
Platelets: 207 10*3/uL (ref 140–400)
RBC: 4.33 10*6/uL (ref 3.80–5.10)
RDW: 12.9 % (ref 11.0–15.0)
WBC: 5.3 10*3/uL (ref 3.8–10.8)

## 2023-04-29 LAB — T4, FREE: Free T4: 1 ng/dL (ref 0.8–1.8)

## 2023-04-29 LAB — TSH: TSH: 2.1 m[IU]/L (ref 0.40–4.50)

## 2023-04-29 LAB — LDL CHOLESTEROL, DIRECT: Direct LDL: 82 mg/dL (ref <100)

## 2023-04-29 LAB — IRON,TIBC AND FERRITIN PANEL
%SAT: 36 % (ref 16–45)
Ferritin: 15 ng/mL — ABNORMAL LOW (ref 16–288)
Iron: 90 ug/dL (ref 45–160)
TIBC: 247 ug/dL — ABNORMAL LOW (ref 250–450)

## 2023-04-29 LAB — VITAMIN D 25 HYDROXY (VIT D DEFICIENCY, FRACTURES): Vit D, 25-Hydroxy: 42 ng/mL (ref 30–100)

## 2023-04-29 LAB — MAGNESIUM: Magnesium: 2.4 mg/dL (ref 1.5–2.5)

## 2023-05-02 ENCOUNTER — Encounter: Payer: Self-pay | Admitting: Family Medicine

## 2023-05-02 ENCOUNTER — Other Ambulatory Visit: Payer: Self-pay | Admitting: Family Medicine

## 2023-05-02 MED ORDER — LEVOTHYROXINE SODIUM 50 MCG PO TABS: 50.0000 ug | ORAL_TABLET | Freq: Every day | ORAL | 3 refills | Status: AC

## 2023-05-03 DIAGNOSIS — M6281 Muscle weakness (generalized): Secondary | ICD-10-CM | POA: Diagnosis not present

## 2023-05-05 DIAGNOSIS — M6281 Muscle weakness (generalized): Secondary | ICD-10-CM | POA: Diagnosis not present

## 2023-05-08 DIAGNOSIS — M6281 Muscle weakness (generalized): Secondary | ICD-10-CM | POA: Diagnosis not present

## 2023-05-15 DIAGNOSIS — M6281 Muscle weakness (generalized): Secondary | ICD-10-CM | POA: Diagnosis not present

## 2023-05-29 DIAGNOSIS — M6281 Muscle weakness (generalized): Secondary | ICD-10-CM | POA: Diagnosis not present

## 2023-05-31 ENCOUNTER — Ambulatory Visit: Payer: Medicare Other | Admitting: Orthopaedic Surgery

## 2023-06-05 DIAGNOSIS — M6281 Muscle weakness (generalized): Secondary | ICD-10-CM | POA: Diagnosis not present

## 2023-06-08 ENCOUNTER — Ambulatory Visit: Payer: Medicare Other | Admitting: Orthopaedic Surgery

## 2023-06-09 DIAGNOSIS — M6281 Muscle weakness (generalized): Secondary | ICD-10-CM | POA: Diagnosis not present

## 2023-06-16 DIAGNOSIS — M6281 Muscle weakness (generalized): Secondary | ICD-10-CM | POA: Diagnosis not present

## 2023-07-06 ENCOUNTER — Other Ambulatory Visit: Payer: Self-pay | Admitting: Family Medicine

## 2023-07-12 DIAGNOSIS — M6281 Muscle weakness (generalized): Secondary | ICD-10-CM | POA: Diagnosis not present

## 2023-07-21 ENCOUNTER — Telehealth: Payer: Self-pay | Admitting: Internal Medicine

## 2023-07-21 NOTE — Telephone Encounter (Signed)
 I know Dr. Wyline Mood is not taking provider switches but patient saw Dr. Wyline Mood in 2016 and then needed an urgent appt with gen card in 2024 and saw Dr. Jenene Slicker as she had the next available. She was wondering if Dr. Wyline Mood would see her again, please advise.

## 2023-07-24 DIAGNOSIS — M6281 Muscle weakness (generalized): Secondary | ICD-10-CM | POA: Diagnosis not present

## 2023-08-02 ENCOUNTER — Ambulatory Visit: Admitting: Family Medicine

## 2023-08-11 ENCOUNTER — Ambulatory Visit: Admitting: Family Medicine

## 2023-08-16 DIAGNOSIS — K219 Gastro-esophageal reflux disease without esophagitis: Secondary | ICD-10-CM | POA: Diagnosis not present

## 2023-08-16 DIAGNOSIS — I10 Essential (primary) hypertension: Secondary | ICD-10-CM | POA: Diagnosis not present

## 2023-08-16 DIAGNOSIS — Z299 Encounter for prophylactic measures, unspecified: Secondary | ICD-10-CM | POA: Diagnosis not present

## 2023-08-16 DIAGNOSIS — E039 Hypothyroidism, unspecified: Secondary | ICD-10-CM | POA: Diagnosis not present

## 2023-08-16 DIAGNOSIS — K449 Diaphragmatic hernia without obstruction or gangrene: Secondary | ICD-10-CM | POA: Diagnosis not present

## 2023-08-16 DIAGNOSIS — E7800 Pure hypercholesterolemia, unspecified: Secondary | ICD-10-CM | POA: Diagnosis not present

## 2023-08-30 ENCOUNTER — Ambulatory Visit: Admitting: Internal Medicine

## 2023-09-12 ENCOUNTER — Other Ambulatory Visit: Payer: Self-pay | Admitting: Family Medicine

## 2023-10-05 DIAGNOSIS — Z79899 Other long term (current) drug therapy: Secondary | ICD-10-CM | POA: Diagnosis not present

## 2023-10-05 DIAGNOSIS — Z881 Allergy status to other antibiotic agents status: Secondary | ICD-10-CM | POA: Diagnosis not present

## 2023-10-05 DIAGNOSIS — R55 Syncope and collapse: Secondary | ICD-10-CM | POA: Diagnosis not present

## 2023-10-05 DIAGNOSIS — Z20822 Contact with and (suspected) exposure to covid-19: Secondary | ICD-10-CM | POA: Diagnosis not present

## 2023-10-05 DIAGNOSIS — R930 Abnormal findings on diagnostic imaging of skull and head, not elsewhere classified: Secondary | ICD-10-CM | POA: Diagnosis not present

## 2023-10-05 DIAGNOSIS — Z885 Allergy status to narcotic agent status: Secondary | ICD-10-CM | POA: Diagnosis not present

## 2023-10-05 DIAGNOSIS — R531 Weakness: Secondary | ICD-10-CM | POA: Diagnosis not present

## 2023-10-05 DIAGNOSIS — E785 Hyperlipidemia, unspecified: Secondary | ICD-10-CM | POA: Diagnosis not present

## 2023-10-05 DIAGNOSIS — Z8616 Personal history of COVID-19: Secondary | ICD-10-CM | POA: Diagnosis not present

## 2023-10-05 DIAGNOSIS — Z91040 Latex allergy status: Secondary | ICD-10-CM | POA: Diagnosis not present

## 2023-10-05 DIAGNOSIS — G4700 Insomnia, unspecified: Secondary | ICD-10-CM | POA: Diagnosis not present

## 2023-10-05 DIAGNOSIS — E039 Hypothyroidism, unspecified: Secondary | ICD-10-CM | POA: Diagnosis not present

## 2023-10-05 DIAGNOSIS — Z87891 Personal history of nicotine dependence: Secondary | ICD-10-CM | POA: Diagnosis not present

## 2023-10-05 DIAGNOSIS — I10 Essential (primary) hypertension: Secondary | ICD-10-CM | POA: Diagnosis not present

## 2023-10-05 DIAGNOSIS — R61 Generalized hyperhidrosis: Secondary | ICD-10-CM | POA: Diagnosis not present

## 2023-10-05 DIAGNOSIS — R413 Other amnesia: Secondary | ICD-10-CM | POA: Diagnosis not present

## 2023-10-05 DIAGNOSIS — Z1152 Encounter for screening for COVID-19: Secondary | ICD-10-CM | POA: Diagnosis not present

## 2023-10-05 DIAGNOSIS — Z882 Allergy status to sulfonamides status: Secondary | ICD-10-CM | POA: Diagnosis not present

## 2023-10-05 DIAGNOSIS — K219 Gastro-esophageal reflux disease without esophagitis: Secondary | ICD-10-CM | POA: Diagnosis not present

## 2023-10-05 DIAGNOSIS — K449 Diaphragmatic hernia without obstruction or gangrene: Secondary | ICD-10-CM | POA: Diagnosis not present

## 2023-10-05 DIAGNOSIS — I672 Cerebral atherosclerosis: Secondary | ICD-10-CM | POA: Diagnosis not present

## 2023-10-05 DIAGNOSIS — Z880 Allergy status to penicillin: Secondary | ICD-10-CM | POA: Diagnosis not present

## 2023-10-05 DIAGNOSIS — R0689 Other abnormalities of breathing: Secondary | ICD-10-CM | POA: Diagnosis not present

## 2023-10-06 DIAGNOSIS — R55 Syncope and collapse: Secondary | ICD-10-CM | POA: Diagnosis not present

## 2023-10-06 DIAGNOSIS — E039 Hypothyroidism, unspecified: Secondary | ICD-10-CM | POA: Diagnosis not present

## 2023-10-06 DIAGNOSIS — E785 Hyperlipidemia, unspecified: Secondary | ICD-10-CM | POA: Diagnosis not present

## 2023-10-06 DIAGNOSIS — I10 Essential (primary) hypertension: Secondary | ICD-10-CM | POA: Diagnosis not present

## 2023-10-06 DIAGNOSIS — Z79899 Other long term (current) drug therapy: Secondary | ICD-10-CM | POA: Diagnosis not present

## 2023-10-06 DIAGNOSIS — K219 Gastro-esophageal reflux disease without esophagitis: Secondary | ICD-10-CM | POA: Diagnosis not present

## 2023-10-06 DIAGNOSIS — R413 Other amnesia: Secondary | ICD-10-CM | POA: Diagnosis not present

## 2023-10-07 DIAGNOSIS — Z79899 Other long term (current) drug therapy: Secondary | ICD-10-CM | POA: Diagnosis not present

## 2023-10-07 DIAGNOSIS — E039 Hypothyroidism, unspecified: Secondary | ICD-10-CM | POA: Diagnosis not present

## 2023-10-07 DIAGNOSIS — E785 Hyperlipidemia, unspecified: Secondary | ICD-10-CM | POA: Diagnosis not present

## 2023-10-07 DIAGNOSIS — I10 Essential (primary) hypertension: Secondary | ICD-10-CM | POA: Diagnosis not present

## 2023-10-07 DIAGNOSIS — K219 Gastro-esophageal reflux disease without esophagitis: Secondary | ICD-10-CM | POA: Diagnosis not present

## 2023-10-07 DIAGNOSIS — R55 Syncope and collapse: Secondary | ICD-10-CM | POA: Diagnosis not present

## 2023-10-09 ENCOUNTER — Telehealth: Payer: Self-pay

## 2023-10-09 NOTE — Transitions of Care (Post Inpatient/ED Visit) (Signed)
   10/09/2023  Name: Debra Sanchez MRN: [id number] DOB: 27-Apr-1940  Today's TOC FU Call Status: Today's TOC FU Call Status:: Unsuccessful Call (1st Attempt) Unsuccessful Call (1st Attempt) Date: 10/09/23  Attempted to reach the patient regarding the most recent Inpatient/ED visit.  Follow Up Plan: Additional outreach attempts will be made to reach the patient to complete the Transitions of Care (Post Inpatient/ED visit) call.   Signature  Charmaine Bloodgood, LPN [facility] Health Advisor [place] [facility] You Are. We Are. [facility] Direct Dial [phone]

## 2023-10-10 DIAGNOSIS — R52 Pain, unspecified: Secondary | ICD-10-CM | POA: Diagnosis not present

## 2023-10-10 DIAGNOSIS — E039 Hypothyroidism, unspecified: Secondary | ICD-10-CM | POA: Diagnosis not present

## 2023-10-10 DIAGNOSIS — I10 Essential (primary) hypertension: Secondary | ICD-10-CM | POA: Diagnosis not present

## 2023-10-10 DIAGNOSIS — G309 Alzheimer's disease, unspecified: Secondary | ICD-10-CM | POA: Diagnosis not present

## 2023-10-10 DIAGNOSIS — I951 Orthostatic hypotension: Secondary | ICD-10-CM | POA: Diagnosis not present

## 2023-10-10 DIAGNOSIS — Z299 Encounter for prophylactic measures, unspecified: Secondary | ICD-10-CM | POA: Diagnosis not present

## 2023-10-13 ENCOUNTER — Other Ambulatory Visit: Payer: Self-pay | Admitting: Family Medicine

## 2023-10-20 DIAGNOSIS — R52 Pain, unspecified: Secondary | ICD-10-CM | POA: Diagnosis not present

## 2023-10-20 DIAGNOSIS — I10 Essential (primary) hypertension: Secondary | ICD-10-CM | POA: Diagnosis not present

## 2023-10-20 DIAGNOSIS — Z1389 Encounter for screening for other disorder: Secondary | ICD-10-CM | POA: Diagnosis not present

## 2023-10-20 DIAGNOSIS — Z7189 Other specified counseling: Secondary | ICD-10-CM | POA: Diagnosis not present

## 2023-10-20 DIAGNOSIS — Z299 Encounter for prophylactic measures, unspecified: Secondary | ICD-10-CM | POA: Diagnosis not present

## 2023-10-20 DIAGNOSIS — Z0000 Encounter for general adult medical examination without abnormal findings: Secondary | ICD-10-CM | POA: Diagnosis not present

## 2023-10-20 DIAGNOSIS — E039 Hypothyroidism, unspecified: Secondary | ICD-10-CM | POA: Diagnosis not present

## 2023-11-23 DIAGNOSIS — M1712 Unilateral primary osteoarthritis, left knee: Secondary | ICD-10-CM | POA: Diagnosis not present

## 2023-11-23 DIAGNOSIS — M25662 Stiffness of left knee, not elsewhere classified: Secondary | ICD-10-CM | POA: Diagnosis not present

## 2023-11-23 DIAGNOSIS — M25562 Pain in left knee: Secondary | ICD-10-CM | POA: Diagnosis not present

## 2023-11-23 DIAGNOSIS — R262 Difficulty in walking, not elsewhere classified: Secondary | ICD-10-CM | POA: Diagnosis not present

## 2023-11-30 DIAGNOSIS — M1712 Unilateral primary osteoarthritis, left knee: Secondary | ICD-10-CM | POA: Diagnosis not present

## 2023-11-30 DIAGNOSIS — R262 Difficulty in walking, not elsewhere classified: Secondary | ICD-10-CM | POA: Diagnosis not present

## 2023-11-30 DIAGNOSIS — M25662 Stiffness of left knee, not elsewhere classified: Secondary | ICD-10-CM | POA: Diagnosis not present

## 2023-11-30 DIAGNOSIS — M25562 Pain in left knee: Secondary | ICD-10-CM | POA: Diagnosis not present

## 2023-12-07 DIAGNOSIS — R262 Difficulty in walking, not elsewhere classified: Secondary | ICD-10-CM | POA: Diagnosis not present

## 2023-12-07 DIAGNOSIS — M25562 Pain in left knee: Secondary | ICD-10-CM | POA: Diagnosis not present

## 2023-12-07 DIAGNOSIS — M25662 Stiffness of left knee, not elsewhere classified: Secondary | ICD-10-CM | POA: Diagnosis not present

## 2023-12-07 DIAGNOSIS — M1712 Unilateral primary osteoarthritis, left knee: Secondary | ICD-10-CM | POA: Diagnosis not present

## 2023-12-14 ENCOUNTER — Encounter: Payer: Self-pay | Admitting: Pharmacist

## 2023-12-14 NOTE — Progress Notes (Signed)
 Pharmacy Quality Measure Review  This patient is appearing on a report for being at risk of failing the adherence measure for hypertension (ACEi/ARB) medications this calendar year.   Medication: losartan  100mg  Last fill date: 09/05/2023 for 90 day supply  Reviewed recent refill history in Dr Annemarie database. Patient has actually chanaged her PCP to Dr Eligio Fairly at [facility]. He sent in new prescription for losartan  HCT 100/12.5mg  for 100 DS on 12/03/2023.     Insurance report was not up to date. No action needed at this time.  Confirmed with patient the new PCP is Dr Fairly. She mentioned that she needed famotidine  RF - last RF request was sent to our office and denied. Called [facility] - they will resent to Dr Jamal office.   Madelin Ray, PharmD Clinical Pharmacist [facility]  Population Health [phone]

## 2023-12-18 DIAGNOSIS — H43811 Vitreous degeneration, right eye: Secondary | ICD-10-CM | POA: Diagnosis not present

## 2023-12-26 DIAGNOSIS — R0602 Shortness of breath: Secondary | ICD-10-CM | POA: Diagnosis not present

## 2023-12-26 DIAGNOSIS — Z981 Arthrodesis status: Secondary | ICD-10-CM | POA: Diagnosis not present

## 2023-12-26 DIAGNOSIS — Z299 Encounter for prophylactic measures, unspecified: Secondary | ICD-10-CM | POA: Diagnosis not present

## 2023-12-26 DIAGNOSIS — I771 Stricture of artery: Secondary | ICD-10-CM | POA: Diagnosis not present

## 2023-12-26 DIAGNOSIS — R059 Cough, unspecified: Secondary | ICD-10-CM | POA: Diagnosis not present

## 2023-12-26 DIAGNOSIS — R5383 Other fatigue: Secondary | ICD-10-CM | POA: Diagnosis not present

## 2023-12-26 DIAGNOSIS — I10 Essential (primary) hypertension: Secondary | ICD-10-CM | POA: Diagnosis not present

## 2024-01-24 ENCOUNTER — Encounter (INDEPENDENT_AMBULATORY_CARE_PROVIDER_SITE_OTHER): Payer: Self-pay | Admitting: Gastroenterology

## 2024-01-26 DIAGNOSIS — Z0000 Encounter for general adult medical examination without abnormal findings: Secondary | ICD-10-CM | POA: Diagnosis not present

## 2024-01-26 DIAGNOSIS — I10 Essential (primary) hypertension: Secondary | ICD-10-CM | POA: Diagnosis not present

## 2024-01-26 DIAGNOSIS — Z23 Encounter for immunization: Secondary | ICD-10-CM | POA: Diagnosis not present

## 2024-01-26 DIAGNOSIS — Z299 Encounter for prophylactic measures, unspecified: Secondary | ICD-10-CM | POA: Diagnosis not present

## 2024-01-26 DIAGNOSIS — R52 Pain, unspecified: Secondary | ICD-10-CM | POA: Diagnosis not present

## 2024-01-26 DIAGNOSIS — G309 Alzheimer's disease, unspecified: Secondary | ICD-10-CM | POA: Diagnosis not present

## 2024-01-27 DIAGNOSIS — J019 Acute sinusitis, unspecified: Secondary | ICD-10-CM | POA: Diagnosis not present

## 2024-01-27 DIAGNOSIS — R0981 Nasal congestion: Secondary | ICD-10-CM | POA: Diagnosis not present

## 2024-01-29 DIAGNOSIS — Z299 Encounter for prophylactic measures, unspecified: Secondary | ICD-10-CM | POA: Diagnosis not present

## 2024-01-29 DIAGNOSIS — G309 Alzheimer's disease, unspecified: Secondary | ICD-10-CM | POA: Diagnosis not present

## 2024-01-29 DIAGNOSIS — J329 Chronic sinusitis, unspecified: Secondary | ICD-10-CM | POA: Diagnosis not present

## 2024-01-29 DIAGNOSIS — I10 Essential (primary) hypertension: Secondary | ICD-10-CM | POA: Diagnosis not present

## 2024-02-02 DIAGNOSIS — H524 Presbyopia: Secondary | ICD-10-CM | POA: Diagnosis not present

## 2024-02-12 ENCOUNTER — Encounter: Payer: Self-pay | Admitting: Radiology

## 2024-02-12 ENCOUNTER — Telehealth: Payer: Self-pay | Admitting: Internal Medicine

## 2024-02-12 NOTE — Telephone Encounter (Signed)
 FYI.  5 ATTEMPTS to contact patient about an appointment. Letter mailed to patient.no response.

## 2024-04-23 ENCOUNTER — Encounter (INDEPENDENT_AMBULATORY_CARE_PROVIDER_SITE_OTHER): Payer: Self-pay | Admitting: *Deleted

## 2024-05-15 ENCOUNTER — Encounter (INDEPENDENT_AMBULATORY_CARE_PROVIDER_SITE_OTHER): Admitting: Gastroenterology
